# Patient Record
Sex: Male | Born: 1950
Health system: Southern US, Community
[De-identification: ages and names within clinical notes are randomized; demographics above are authoritative.]

## PROBLEM LIST (undated history)

## (undated) DIAGNOSIS — I10 Essential (primary) hypertension: Secondary | ICD-10-CM

## (undated) DIAGNOSIS — I712 Thoracic aortic aneurysm, without rupture, unspecified: Secondary | ICD-10-CM

## (undated) DIAGNOSIS — M4306 Spondylolysis, lumbar region: Secondary | ICD-10-CM

## (undated) DIAGNOSIS — C801 Malignant (primary) neoplasm, unspecified: Secondary | ICD-10-CM

## (undated) DIAGNOSIS — E785 Hyperlipidemia, unspecified: Secondary | ICD-10-CM

## (undated) DIAGNOSIS — T7840XA Allergy, unspecified, initial encounter: Secondary | ICD-10-CM

## (undated) DIAGNOSIS — Z87442 Personal history of urinary calculi: Secondary | ICD-10-CM

## (undated) DIAGNOSIS — M1990 Unspecified osteoarthritis, unspecified site: Secondary | ICD-10-CM

## (undated) DIAGNOSIS — R918 Other nonspecific abnormal finding of lung field: Secondary | ICD-10-CM

## (undated) HISTORY — PX: COLONOSCOPY: SHX174

## (undated) HISTORY — DX: Hyperlipidemia, unspecified: E78.5

## (undated) HISTORY — DX: Thoracic aortic aneurysm, without rupture: I71.2

## (undated) HISTORY — DX: Essential (primary) hypertension: I10

## (undated) HISTORY — DX: Spondylolysis, lumbar region: M43.06

## (undated) HISTORY — DX: Allergy, unspecified, initial encounter: T78.40XA

## (undated) HISTORY — DX: Thoracic aortic aneurysm, without rupture, unspecified: I71.20

## (undated) HISTORY — DX: Other nonspecific abnormal finding of lung field: R91.8

---

## 1973-01-05 HISTORY — PX: HERNIA REPAIR: SHX51

## 2000-01-06 HISTORY — PX: SHOULDER SURGERY: SHX246

## 2000-11-25 ENCOUNTER — Ambulatory Visit (HOSPITAL_BASED_OUTPATIENT_CLINIC_OR_DEPARTMENT_OTHER): Admission: RE | Admit: 2000-11-25 | Discharge: 2000-11-26 | Payer: Self-pay | Admitting: Orthopedic Surgery

## 2009-03-14 ENCOUNTER — Ambulatory Visit: Payer: Self-pay | Admitting: Cardiology

## 2009-03-14 DIAGNOSIS — E785 Hyperlipidemia, unspecified: Secondary | ICD-10-CM

## 2009-03-14 DIAGNOSIS — E783 Hyperchylomicronemia: Secondary | ICD-10-CM | POA: Insufficient documentation

## 2009-03-14 DIAGNOSIS — I10 Essential (primary) hypertension: Secondary | ICD-10-CM

## 2009-05-06 ENCOUNTER — Telehealth (INDEPENDENT_AMBULATORY_CARE_PROVIDER_SITE_OTHER): Payer: Self-pay | Admitting: *Deleted

## 2009-05-21 ENCOUNTER — Ambulatory Visit: Payer: Self-pay | Admitting: Cardiology

## 2009-05-24 ENCOUNTER — Telehealth: Payer: Self-pay | Admitting: Cardiology

## 2009-05-27 LAB — CONVERTED CEMR LAB
ALT: 47 units/L (ref 0–53)
Bilirubin, Direct: 0.2 mg/dL (ref 0.0–0.3)
Cholesterol: 119 mg/dL (ref 0–200)
HDL: 36 mg/dL — ABNORMAL LOW (ref >39.00)
LDL Cholesterol: 70 mg/dL (ref 0–99)
Total Bilirubin: 1 mg/dL (ref 0.3–1.2)
Total CHOL/HDL Ratio: 3
Triglycerides: 66 mg/dL (ref 0.0–149.0)
VLDL: 13.2 mg/dL (ref 0.0–40.0)

## 2009-10-15 ENCOUNTER — Telehealth (INDEPENDENT_AMBULATORY_CARE_PROVIDER_SITE_OTHER): Payer: Self-pay | Admitting: Radiology

## 2009-10-16 ENCOUNTER — Ambulatory Visit: Payer: Self-pay

## 2009-10-16 ENCOUNTER — Encounter: Payer: Self-pay | Admitting: Cardiovascular Disease

## 2009-10-16 ENCOUNTER — Ambulatory Visit: Payer: Self-pay | Admitting: Cardiovascular Disease

## 2009-10-16 ENCOUNTER — Encounter (HOSPITAL_COMMUNITY)
Admission: RE | Admit: 2009-10-16 | Discharge: 2009-11-01 | Payer: Self-pay | Source: Home / Self Care | Admitting: Cardiology

## 2010-02-04 NOTE — Progress Notes (Signed)
Summary: NUC PRE-PROCEDURE  Phone Note Outgoing Call   Call placed by: Domenic Polite, CNMT,  October 15, 2009 10:41 AM Call placed to: Patient Reason for Call: Confirm/change Appt Summary of Call: Left message with information on [employer] Information Sheet (see scanned document for details).   Initial call taken by: Domenic Polite, CNMT,  October 15, 2009 10:42 AM     Nuclear Med Background Indications for Stress Test: Evaluation for Ischemia     Symptoms: Chest Pain    Nuclear Pre-Procedure Cardiac Risk Factors: Hypertension, Lipids Height (in): 69

## 2010-02-04 NOTE — Progress Notes (Signed)
  Phone Note Outgoing Call   Call placed by: Scherrie Bateman, LPN,  May 06, 1608 3:52 PM Call placed to: Patient Summary of Call: PAST DUE FOR LIPID LIVER  LMTCB AND SCHEDULE.STARTED CRESTOR 20 MG 1 once daily Initial call taken by: Scherrie Bateman, LPN,  May 07, 9602 3:53 PM  Follow-up for Phone Call        n/a x1 Scherrie Bateman, LPN  May 08, 5407 8:30 AM lmtcb Scherrie Bateman, LPN  May 08, 8117 9:08 AM SPOKE WITH PT REMINDED TO COME IN FOR LIPID LIVER PER PT WILL BE IN NEXT WEEK. Follow-up by: Scherrie Bateman, LPN,  May 17, 2009 12:52 PM

## 2010-02-04 NOTE — Assessment & Plan Note (Signed)
Summary: NP6/ CVA RISK ASSESSMENT/ PER TW/ PT HAS [employer]/ GD   Visit Type:  new pt visit Primary Provider:  [facility]  CC:  no cardiac complaints today.  History of Present Illness: Shane Robinson comes in today self-referred for a cardiovascular risk assessment.  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  He has 3 risk factors for coronary disease. This includes age, hypertension for about 4 years which has been well controlled, and hyperlipidemia. He has been on a statin for 4 years.  He is a little bit overweight but very muscular. He works extremely hard manually on the farm. He denies any symptoms of angina or ischemia. His blood pressure is usually under good control.  His last laboratory data on 20 mg of Lipitor with a total cholesterol 183, triglycerides 192 which was fasting, HDL 42, LDL 103. His blood sugars normal creatinine is normal his potassium was 4.9.  He denies orthopnea PND or peripheral edema. He's had no palpitations or syncope or presyncope.  Preventive Screening-Counseling & Management  Alcohol-Tobacco     Smoking Status: never  Caffeine-Diet-Exercise     Does Patient Exercise: yes      Drug Use:  no.    Current Medications (verified): 1)  Lipitor 20 Mg Tabs (Atorvastatin Calcium) .Marland Kitchen.. 1 Tab At Bedtime 2)  Hydrochlorothiazide 12.5 Mg Caps (Hydrochlorothiazide) .Marland Kitchen.. 1 Cap Once Daily  Allergies (verified): No Known Drug Allergies  Past History:  Past Surgical History: Right Rotater Cuff  Family History: Father: Stroke in 92's  Social History: Full Time Married  Tobacco Use - No.  Alcohol Use - yes Regular Exercise - yes Drug Use - no Smoking Status:  never Does Patient Exercise:  yes Drug Use:  no  Review of Systems       negative other than history of present illness  Vital Signs:  Patient profile:   60 year old male Height:      69 inches Weight:      233 pounds BMI:     34.53 Pulse rate:   70 /  minute Pulse rhythm:   regular BP sitting:   126 / 80  (left arm) Cuff size:   large  Vitals Entered By: Danielle Rankin, CMA (March 14, 2009 1:53 PM)  Physical Exam  General:  muscular, slightly overweight Head:  normocephalic and atraumatic Eyes:  PERRLA/EOM intact; conjunctiva and lids normal. Neck:  Neck supple, no JVD. No masses, thyromegaly or abnormal cervical nodes. Chest Aneth Schlagel:  no deformities or breast masses noted Lungs:  Clear bilaterally to auscultation and percussion. Heart:  Non-displaced PMI, chest non-tender; regular rate and rhythm, S1, S2 without murmurs, rubs or gallops. Carotid upstroke normal, no bruit. Normal abdominal aortic size, no bruits. Femorals normal pulses, no bruits. Pedals normal pulses. No edema, no varicosities. Abdomen:  Bowel sounds positive; abdomen soft and non-tender without masses, organomegaly, or hernias noted. No hepatosplenomegaly. Msk:  decreased ROM.   Pulses:  pulses normal in all 4 extremities Extremities:  No clubbing or cyanosis. Neurologic:  Alert and oriented x 3. Skin:  Intact without lesions or rashes. Psych:  Normal affect.   Problems:  Medical Problems Added: 1)  Dx of Hypertension, Unspecified  (ICD-401.9) 2)  Dx of Hyperlipidemia-mixed  (ICD-272.4) 3)  Dx of Hyperlipidemia Type I / Iv  (ICD-272.3)  EKG  Procedure date:  03/14/2009  Findings:      normal sinus rhythm, normal EKG  Impression & Recommendations:  Problem #  1:  HYPERLIPIDEMIA-MIXED (ICD-272.4) I have reviewed his blood work drawn in October of 2010. To prevent progression and increase the anti-inflammatory effect of his statin, I will change him to Crestor 20 mg a day. We will check fasting blood work in 6 weeks. I've encouraged him to drop carbs as much as possible tickly sweets to drop some weight and his triglycerides. Goal LDL will be 70 or less. It may take 40 of Crestor. I certainly don't think we can get that level with Lipitor.  I spent a  considerable amount of time explaining to him plaque progression, inflammatory nature of plaque in its role in acute coronary syndromes, and how to respond if he has symptoms of angina or an acute coronary syndrome. I've also asked him to take any aspirin 81 mg a day. The following medications were removed from the medication list:    Lipitor 20 Mg Tabs (Atorvastatin calcium) .Marland Kitchen... 1 tab at bedtime His updated medication list for this problem includes:    Crestor 20 Mg Tabs (Rosuvastatin calcium) .Marland Kitchen... 1 once daily  Problem # 2:  HYPERTENSION, UNSPECIFIED (ICD-401.9) his That blood pressures under good control and his HDL above 40. I will make no changes in his program. His updated medication list for this problem includes:    Hydrochlorothiazide 12.5 Mg Caps (Hydrochlorothiazide) .Marland Kitchen... 1 cap once daily  Other Orders: EKG w/ Interpretation (93000)  Patient Instructions: 1)  Your physician recommends that you schedule a follow-up appointment in: AS NEEDED 2)  Your physician recommends that you return for lab work ZO:XWRUE LIVER  IN 6 WEEKS WEEK OF APRIL 21 3)  Your physician has recommended you make the following change in your medication: STOP LIPITOR START  CRESTOR 20 MG  Prescriptions: CRESTOR 20 MG TABS (ROSUVASTATIN CALCIUM) 1 once daily  #30 x 11   Entered by:   Scherrie Bateman, LPN   Authorized by:   Gaylord Shih, MD, [facility]   Signed by:   Scherrie Bateman, LPN on 45/40/9811   Method used:   Electronically to        [facility]  [address] (retail)       [address]       [place], [place]  [address]       Ph: [phone]       Fax: [phone]   RxID:   [id number]

## 2010-02-04 NOTE — Progress Notes (Signed)
Summary: test results   Phone Note Call from Patient Call back at Home Phone [phone]   Caller: Patient Reason for Call: Talk to Nurse, Lab or Test Results Initial call taken by: Lorne Skeens,  May 24, 2009 3:13 PM  Follow-up for Phone Call        Left message to call back Meredith Staggers, RN  May 24, 2009 3:26 PM   I attempted to call the pt at his home #. I was told the pt is at work and the best time to try to reach him at this # is b/t 10:30am- 11:00am. I will c/b. Sherri Rad, RN, BSN  May 27, 2009 8:26 AM   I spoke with the pt. Follow-up by: Sherri Rad, RN, BSN,  May 27, 2009 10:49 AM

## 2010-02-04 NOTE — Assessment & Plan Note (Signed)
Summary: Cardiology Nuclear Testing  Nuclear Med Background Indications for Stress Test: Evaluation for Ischemia     Symptoms: Palpitations    Nuclear Pre-Procedure Cardiac Risk Factors: Hypertension, Lipids Caffeine/Decaff Intake: None NPO After: 6:30 AM Lungs: clear IV 0.9% NS with Angio Cath: 22g     IV Site: R Hand IV Started by: Bonnita Levan, RN Chest Size (in) 46     Height (in): 69 Weight (lb): 234 BMI: 34.68  Nuclear Med Study 1 or 2 day study:  1 day     Stress Test Type:  Stress Reading MD:  Charlton Haws, MD     Referring MD:  T.Wall Resting Radionuclide:  Technetium 37m Tetrofosmin     Resting Radionuclide Dose:  10.8 mCi  Stress Radionuclide:  Technetium 41m Tetrofosmin     Stress Radionuclide Dose:  33.0 mCi   Stress Protocol Exercise Time (min):  6:00 min     Max HR:  142 bpm     Predicted Max HR:  162 bpm  Max Systolic BP: 195 mm Hg     Percent Max HR:  87.65 %     METS: 7.0 Rate Pressure Product:  [address]    Stress Test Technologist:  Milana Na, EMT-P     Nuclear Technologist:  Doyne Keel, CNMT  Rest Procedure  Myocardial perfusion imaging was performed at rest 45 minutes following the intravenous administration of Technetium 77m Tetrofosmin.  Stress Procedure  The patient exercised for 6:00. The patient stopped due to fatigue and denied any chest pain.  There were no significant ST-T wave changes and rare pvcs/V-Cuplets.  Technetium 20m Tetrofosmin was injected at peak exercise and myocardial perfusion imaging was performed after a brief delay.  QPS Raw Data Images:  Normal; no motion artifact; normal heart/lung ratio. Stress Images:  Normal homogeneous uptake in all areas of the myocardium. Rest Images:  Normal homogeneous uptake in all areas of the myocardium. Subtraction (SDS):  Normal Transient Ischemic Dilatation:  1.06  (Normal <1.22)  Lung/Heart Ratio:  0.39  (Normal <0.45)  Quantitative Gated Spect Images QGS EDV:  116 ml QGS ESV:   51 ml QGS EF:  56 % QGS cine images:  normal  Findings Low risk nuclear study      Overall Impression  Exercise Capacity: Fair exercise capacity. BP Response: Normal blood pressure response. Clinical Symptoms: No chest pain ECG Impression: No significant ST segment change suggestive of ischemia. Overall Impression: Thinning of the apex worse on rest images not thought to be significant.  No ischemia    Appended Document: Cardiology Nuclear Testing I called pt with results. No change in treatment.

## 2010-05-02 ENCOUNTER — Encounter: Payer: Self-pay | Admitting: Cardiology

## 2010-05-23 NOTE — Op Note (Signed)
[place]  Patient:    Shane Robinson, Shane Robinson Visit Number: [id number] MRN: [id number]          Service Type: DSU Location: [facility] Attending Physician:  Colbert Ewing Dictated by:   Loreta Ave, M.D. Proc. Date: 11/25/00 Admit Date:  11/25/2000                             Operative Report  PREOPERATIVE DIAGNOSES: 1. Right shoulder chronic retracted massive rotator cuff tear. 2. Chronic impingement. 3. Large os acromiale. 4. Degenerative joint disease acromioclavicular joint. 5. Dislocated biceps tendon, long head.  POSTOPERATIVE DIAGNOSES: 1. Right shoulder chronic retracted massive rotator cuff tear. 2. Chronic impingement. 3. Large os acromiale. 4. Degenerative joint disease acromioclavicular joint. 5. Dislocated biceps tendon, long head.  OPERATIVE PROCEDURES: 1. Right shoulder examination under anesthesia. 2. Arthroscopy. 3. Debridement of labral tears. 4. Assessment rotator cuff. 5. Arthroscopic acromioplasty. 6. Excision distal clavicle. 7. Open repair rotator cuff tear with fiber wire suture and Concept Repair    System. 8. Relocation tenodesis long head biceps tendon. 9. Open excision os acromiale and primary repair of deltoid to anterior    acromion - all right shoulder.  SURGEON:  Loreta Ave, M.D.  ASSISTANT:  Arlys John D. Petrarca, P.A.-C.  ANESTHESIA:  General.  BLOOD LOSS:  Minimal.  SPECIMENS:  None.  CULTURES:  None.  COMPLICATIONS:  None.  DRESSING:  Soft compressive with shoulder immobilizer.  DESCRIPTION OF PROCEDURE:  The patient was brought to the operating room and placed on the operating table in supine position.  After adequate anesthesia had been obtained, the right shoulder was examined.  Fairly good passive full motion without instability.  Placed in a beach chair position on a shoulder positioner, prepped and draped in usual sterile fashion.  Three standard arthroscopic portals -  anterior, posterior and lateral.  Shoulder entered with a blunt obturator, distended, and inspected.  Marked attritional tearing labrum, all debrided.  Biceps tendon was intact, but subluxed off the front of the humeral head.  Complete avulsion rotator cuff, including top of the subscapularis, entire supraspinatus, and top of the infraspinatus retracted to the level of the glenoid.  Despite the massive tears, still relatively mobile and felt to be repairable.  Type two acromion with a large nonunited os acromiale involving the anterior third of the acromion and about half of the [facility] joint.  Grade four changes distal clavicle.  Acromioplasty to a Type one acromion both on the os and on the acromion itself.  Lateral cm of clavicle was resected for grade four changes.  Cuff debrided and assessed.  Instruments and fluid was removed.  Deltoid splitting incision through the lateral portal.  The cuff assessed.  Mobilized throughout.  Well captured with five #2 fiber wire sutures.  Biceps tendon, long head, relocated to normal position.  A series of drill holes were made in the humeral attachment in a bony trough off the articular cartilage surface with the Concept Repair System.  Sutures were then weaved through this.  The anterior suture through the subscapularis was brought in front of the biceps tendon and then numerous sutures behind the biceps tendon.  These were placed at tenodesis biceps tendon down into its normal anatomic position in the groove and repaired end of the groove.  Once all the sutures were passed through bony tunnels, they were firmly pulled over and then tied over bony bridges on  the lateral aspect of the humerus.  This gave a nice, firm, watertight closure of the cuff with restoration of the long head of the biceps tendon in its normal position.  Despite how retracted and large this was, once I mobilized it and repaired it, I could bring the arm through full motion without  undue tension.  The incision was then brought more proximal.  Since I could get a watertight closure of the cuff, I elected to excise a very mobile symptomatic os acromiale.  This was shelled off from surrounding fascia and deltoid.  The wound was irrigated.  Deltoid was then reapproximated and sewn to the front of the remaining acromion, which was more than two-thirds the original acromion.  Nice firm Psychologist, forensic and closure. Adequacy of decompression was confirmed digitally before this was completed. Wound had been thoroughly irrigated before repair of the deltoid after os excision.  Wound irrigated.  Incision closed with subcutaneous and subcuticular Vicryl.  Portals closed with nylon.  Margins of  the wound injected with Marcaine.  Sterile compressive dressing and shoulder immobilizer applied.  Anesthesia reversed, brought to recovery room.  Tolerated surgery well, no complications. Dictated by:   Loreta Ave, M.D. Attending Physician:  Colbert Ewing DD:  11/25/00 TD:  11/26/00 Job: [id number]

## 2010-08-07 ENCOUNTER — Encounter: Payer: Self-pay | Admitting: Family Medicine

## 2011-10-08 ENCOUNTER — Ambulatory Visit (HOSPITAL_COMMUNITY)
Admission: RE | Admit: 2011-10-08 | Discharge: 2011-10-08 | Disposition: A | Discharge status: Home / Self Care | Payer: 59 | Source: Ambulatory Visit | Attending: Orthopedic Surgery | Admitting: Orthopedic Surgery

## 2011-10-08 ENCOUNTER — Other Ambulatory Visit (HOSPITAL_COMMUNITY): Payer: Self-pay | Admitting: Orthopedic Surgery

## 2011-10-08 DIAGNOSIS — IMO0002 Reserved for concepts with insufficient information to code with codable children: Secondary | ICD-10-CM

## 2011-10-08 DIAGNOSIS — Z1389 Encounter for screening for other disorder: Secondary | ICD-10-CM | POA: Insufficient documentation

## 2012-07-07 ENCOUNTER — Telehealth: Payer: Self-pay | Admitting: *Deleted

## 2012-07-07 MED ORDER — TOBRAMYCIN 0.3 % OP SOLN: 2.0000 [drp] | Freq: Four times a day (QID) | OPHTHALMIC | Status: DC

## 2012-07-07 NOTE — Addendum Note (Signed)
Addended by: Lisabeth Devoid F on: 07/07/2012 10:25 AM   Modules accepted: Orders

## 2012-07-07 NOTE — Telephone Encounter (Signed)
Dr. Daleen Squibb calls today to prescribed Tobramycin eye gtts for pt right eye tear duct irritation. Pt is aware medication prescription sent in. Mylo Red RN

## 2012-07-20 ENCOUNTER — Other Ambulatory Visit: Payer: Self-pay | Admitting: Family Medicine

## 2012-07-20 NOTE — Telephone Encounter (Signed)
needs ov been yr,left message to return call/ss

## 2012-07-22 ENCOUNTER — Encounter: Payer: Self-pay | Admitting: Family Medicine

## 2012-07-22 ENCOUNTER — Telehealth: Payer: Self-pay | Admitting: Family Medicine

## 2012-07-22 MED ORDER — BENAZEPRIL-HYDROCHLOROTHIAZIDE 10-12.5 MG PO TABS: 1.0000 | ORAL_TABLET | Freq: Every day | ORAL | Status: DC

## 2012-07-22 MED ORDER — ROSUVASTATIN CALCIUM 20 MG PO TABS: 20.0000 mg | ORAL_TABLET | Freq: Every day | ORAL | Status: DC

## 2012-07-22 NOTE — Telephone Encounter (Signed)
Letter to pt to make appt.

## 2012-07-22 NOTE — Telephone Encounter (Signed)
Rx Refilled  

## 2012-09-19 ENCOUNTER — Ambulatory Visit: Payer: Self-pay | Admitting: Family Medicine

## 2012-09-29 ENCOUNTER — Encounter (HOSPITAL_COMMUNITY): Payer: Self-pay | Admitting: Neurology

## 2012-09-29 ENCOUNTER — Emergency Department (HOSPITAL_COMMUNITY)
Admission: EM | Admit: 2012-09-29 | Discharge: 2012-09-29 | Disposition: A | Discharge status: Home / Self Care | Payer: 59 | Attending: Emergency Medicine | Admitting: Emergency Medicine

## 2012-09-29 ENCOUNTER — Emergency Department (HOSPITAL_COMMUNITY): Payer: 59

## 2012-09-29 DIAGNOSIS — R110 Nausea: Secondary | ICD-10-CM | POA: Insufficient documentation

## 2012-09-29 DIAGNOSIS — I10 Essential (primary) hypertension: Secondary | ICD-10-CM | POA: Insufficient documentation

## 2012-09-29 DIAGNOSIS — N201 Calculus of ureter: Secondary | ICD-10-CM | POA: Insufficient documentation

## 2012-09-29 DIAGNOSIS — Z9109 Other allergy status, other than to drugs and biological substances: Secondary | ICD-10-CM | POA: Insufficient documentation

## 2012-09-29 DIAGNOSIS — E785 Hyperlipidemia, unspecified: Secondary | ICD-10-CM | POA: Insufficient documentation

## 2012-09-29 DIAGNOSIS — Z79899 Other long term (current) drug therapy: Secondary | ICD-10-CM | POA: Insufficient documentation

## 2012-09-29 LAB — COMPREHENSIVE METABOLIC PANEL
ALT: 35 U/L (ref 0–53)
AST: 36 U/L (ref 0–37)
Albumin: 4.4 g/dL (ref 3.5–5.2)
Alkaline Phosphatase: 50 U/L (ref 39–117)
Creatinine, Ser: 1.25 mg/dL (ref 0.50–1.35)
Glucose, Bld: 102 mg/dL — ABNORMAL HIGH (ref 70–99)
Potassium: 4.4 mEq/L (ref 3.5–5.1)
Sodium: 140 mEq/L (ref 135–145)
Total Protein: 8.1 g/dL (ref 6.0–8.3)

## 2012-09-29 LAB — CBC WITH DIFFERENTIAL/PLATELET
Eosinophils Absolute: 0.1 10*3/uL (ref 0.0–0.7)
Eosinophils Relative: 1 % (ref 0–5)
HCT: 45.2 % (ref 39.0–52.0)
Hemoglobin: 15 g/dL (ref 13.0–17.0)
Lymphs Abs: 1.7 10*3/uL (ref 0.7–4.0)
MCH: 31.4 pg (ref 26.0–34.0)
Monocytes Absolute: 0.6 10*3/uL (ref 0.1–1.0)
Neutrophils Relative %: 80 % — ABNORMAL HIGH (ref 43–77)
Platelets: 180 10*3/uL (ref 150–400)
RBC: 4.77 MIL/uL (ref 4.22–5.81)
WBC: 12 10*3/uL — ABNORMAL HIGH (ref 4.0–10.5)

## 2012-09-29 LAB — URINALYSIS, ROUTINE W REFLEX MICROSCOPIC
Bilirubin Urine: NEGATIVE
Glucose, UA: NEGATIVE mg/dL
Nitrite: NEGATIVE
Specific Gravity, Urine: 1.012 (ref 1.005–1.030)
Urobilinogen, UA: 0.2 mg/dL (ref 0.0–1.0)

## 2012-09-29 LAB — URINE MICROSCOPIC-ADD ON

## 2012-09-29 MED ORDER — OXYCODONE-ACETAMINOPHEN 5-325 MG PO TABS: ORAL_TABLET | ORAL | Status: DC

## 2012-09-29 MED ORDER — KETOROLAC TROMETHAMINE 30 MG/ML IJ SOLN
30.0000 mg | Freq: Once | INTRAMUSCULAR | Status: AC
  Administered 2012-09-29: 30 mg via INTRAVENOUS
  Filled 2012-09-29: qty 1

## 2012-09-29 MED ORDER — TAMSULOSIN HCL 0.4 MG PO CAPS: ORAL_CAPSULE | ORAL | Status: DC

## 2012-09-29 MED ORDER — SODIUM CHLORIDE 0.9 % IV SOLN
Freq: Once | INTRAVENOUS | Status: AC
  Administered 2012-09-29: 11:00:00 via INTRAVENOUS

## 2012-09-29 MED ORDER — ONDANSETRON HCL 4 MG PO TABS: 4.0000 mg | ORAL_TABLET | Freq: Three times a day (TID) | ORAL | Status: DC | PRN

## 2012-09-29 MED ORDER — HYDROMORPHONE HCL PF 1 MG/ML IJ SOLN
1.0000 mg | Freq: Once | INTRAMUSCULAR | Status: AC
  Administered 2012-09-29: 1 mg via INTRAVENOUS
  Filled 2012-09-29: qty 1

## 2012-09-29 MED ORDER — ONDANSETRON HCL 4 MG/2ML IJ SOLN
4.0000 mg | Freq: Once | INTRAMUSCULAR | Status: AC
  Administered 2012-09-29: 4 mg via INTRAVENOUS
  Filled 2012-09-29: qty 2

## 2012-09-29 MED ORDER — MORPHINE SULFATE 4 MG/ML IJ SOLN
4.0000 mg | Freq: Once | INTRAMUSCULAR | Status: AC
  Administered 2012-09-29: 4 mg via INTRAVENOUS
  Filled 2012-09-29: qty 1

## 2012-09-29 NOTE — ED Notes (Addendum)
Pt reporting left lower flank pain since 0730 this morning. Nausea present, denies hx of kidney stones or hematuria. Pt is crying at time of triage.

## 2012-09-29 NOTE — ED Notes (Signed)
Placed on 2L/Shane Robinson patient dropped oxygen level to 88% after given Dilaudid.  Patient now up to 98%

## 2012-09-29 NOTE — ED Provider Notes (Signed)
CSN: [id number]     Arrival date & time 09/29/12  0919 History   First MD Initiated Contact with Patient 09/29/12 0945     Chief Complaint  Patient presents with  . 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 He had nausea without vomiting. He states he did not have hematuria today or before today. He states nothing he does makes the pain hurt more other than deep breathing. He does not feel short of breath, and he denies chest pain. He states the pain is severe and has been constant although it does wax and wane. He states he's never had this pain before. Patient does has a history of hypertension.  Patient denies family history of renal stones or aneurysms  PCP Dr Tanya Nones at [facility]  Past Medical History  Diagnosis Date  . Allergy   . Hypertension   . Elevated lipids    Past Surgical History  Procedure Laterality Date  . Shoulder surgery    . Hernia repair     No family history on file. History  Substance Use Topics  . Smoking status: Never Smoker   . Smokeless tobacco: Not on file  . Alcohol Use: Yes  lives at home Lives with spouse Runs a dairy farm  Review of Systems  All other systems reviewed and are negative.    Allergies  Review of patient's allergies indicates no known allergies.  Home Medications   Current Outpatient Rx  Name  Route  Sig  Dispense  Refill  . benazepril-hydrochlorthiazide (LOTENSIN HCT) 10-12.5 MG per tablet   Oral   Take 1 tablet by mouth daily.   90 tablet   0   . Multiple Vitamins-Minerals (CENTRUM SILVER PO)   Oral   Take 1 tablet by mouth daily.         . rosuvastatin (CRESTOR) 20 MG tablet   Oral   Take 1 tablet (20 mg total) by mouth daily.   90 tablet   0    BP 160/91  Pulse 67  Temp(Src) 98.2 F (36.8 C)   Resp 20  Ht 5\' 9"  (1.753 m)  Wt 228 lb (103.42 kg)  BMI 33.65 kg/m2  SpO2 99%  Vital signs normal    Physical Exam  Nursing note and vitals reviewed. Constitutional: He is oriented to person, place, and time. He appears well-developed and well-nourished.  Non-toxic appearance. He does not appear ill. No distress.  HENT:  Head: Normocephalic and atraumatic.  Right Ear: External ear normal.  Left Ear: External ear normal.  Nose: Nose normal. No mucosal edema or rhinorrhea.  Mouth/Throat: Oropharynx is clear and moist and mucous membranes are normal. No dental abscesses or edematous.  Eyes: Conjunctivae and EOM are normal. Pupils are equal, round, and reactive to light.  Neck: Normal range of motion and full passive range of motion without pain. Neck supple.  Cardiovascular: Normal rate, regular rhythm and normal heart sounds.  Exam reveals no gallop and no friction rub.   No murmur heard. Pulmonary/Chest: Effort normal and breath sounds normal. No respiratory distress. He has no wheezes. He has no rhonchi. He has no rales. He exhibits no tenderness and no crepitus.  Abdominal: Soft. Normal appearance and bowel sounds are normal. He exhibits no distension. There is no tenderness. There is no rebound and no guarding.  Musculoskeletal:  Normal range of motion. He exhibits no edema and no tenderness.       Back:  Moves all extremities well.   Area of left flank pain noted  Neurological: He is alert and oriented to person, place, and time. He has normal strength. No cranial nerve deficit.  Skin: Skin is warm, dry and intact. No rash noted. No erythema. No pallor.  Psychiatric: He has a normal mood and affect. His speech is normal and behavior is normal. His mood appears not anxious.    ED Course  Procedures (including critical care time)  Medications  0.9 %  sodium chloride infusion ( Intravenous Stopped 09/29/12 1442)  HYDROmorphone (DILAUDID) injection 1 mg (1 mg Intravenous Given  09/29/12 1047)  ondansetron (ZOFRAN) injection 4 mg (4 mg Intravenous Given 09/29/12 1047)  morphine 4 MG/ML injection 4 mg (4 mg Intravenous Given 09/29/12 1312)  ketorolac (TORADOL) 30 MG/ML injection 30 mg (30 mg Intravenous Given 09/29/12 1514)   Discussed patient's test results. States the initial pain med got rid of his pain but it was starting to return at 12:30. More pain meds given (pt had to be put on oxygen after the dilaudid for hypoxia).   Pt ready to be discharged with no pain at discharge. Discussed reasons to return to the ED.    Labs Review  Results for orders placed during the hospital encounter of 09/29/12  URINALYSIS, ROUTINE W REFLEX MICROSCOPIC      Result Value Range   Color, Urine YELLOW  YELLOW   APPearance HAZY (*) CLEAR   Specific Gravity, Urine 1.012  1.005 - 1.030   pH 5.5  5.0 - 8.0   Glucose, UA NEGATIVE  NEGATIVE mg/dL   Hgb urine dipstick LARGE (*) NEGATIVE   Bilirubin Urine NEGATIVE  NEGATIVE   Ketones, ur NEGATIVE  NEGATIVE mg/dL   Protein, ur NEGATIVE  NEGATIVE mg/dL   Urobilinogen, UA 0.2  0.0 - 1.0 mg/dL   Nitrite NEGATIVE  NEGATIVE   Leukocytes, UA NEGATIVE  NEGATIVE  CBC WITH DIFFERENTIAL      Result Value Range   WBC 12.0 (*) 4.0 - 10.5 K/uL   RBC 4.77  4.22 - 5.81 MIL/uL   Hemoglobin 15.0  13.0 - 17.0 g/dL   HCT 16.1  09.6 - 04.5 %   MCV 94.8  78.0 - 100.0 fL   MCH 31.4  26.0 - 34.0 pg   MCHC 33.2  30.0 - 36.0 g/dL   RDW 40.9  81.1 - 91.4 %   Platelets 180  150 - 400 K/uL   Neutrophils Relative % 80 (*) 43 - 77 %   Neutro Abs 9.6 (*) 1.7 - 7.7 K/uL   Lymphocytes Relative 14  12 - 46 %   Lymphs Abs 1.7  0.7 - 4.0 K/uL   Monocytes Relative 5  3 - 12 %   Monocytes Absolute 0.6  0.1 - 1.0 K/uL   Eosinophils Relative 1  0 - 5 %   Eosinophils Absolute 0.1  0.0 - 0.7 K/uL   Basophils Relative 0  0 - 1 %   Basophils Absolute 0.0  0.0 - 0.1 K/uL  COMPREHENSIVE METABOLIC PANEL      Result Value Range   Sodium 140  135 - 145 mEq/L    Potassium 4.4  3.5 - 5.1 mEq/L   Chloride 103  96 - 112 mEq/L   CO2 27  19 - 32 mEq/L   Glucose, Bld 102 (*) 70 - 99 mg/dL  BUN 22  6 - 23 mg/dL   Creatinine, Ser 1.61  0.50 - 1.35 mg/dL   Calcium 9.3  8.4 - 09.6 mg/dL   Total Protein 8.1  6.0 - 8.3 g/dL   Albumin 4.4  3.5 - 5.2 g/dL   AST 36  0 - 37 U/L   ALT 35  0 - 53 U/L   Alkaline Phosphatase 50  39 - 117 U/L   Total Bilirubin 0.5  0.3 - 1.2 mg/dL   GFR calc non Af Amer 61 (*) >90 mL/min   GFR calc Af Amer 70 (*) >90 mL/min  URINE MICROSCOPIC-ADD ON      Result Value Range   Squamous Epithelial / LPF RARE  RARE   WBC, UA 0-2  <3 WBC/hpf   RBC / HPF 21-50  <3 RBC/hpf   Bacteria, UA RARE  RARE   Laboratory interpretation all normal except leukocytosis     Imaging Review Ct Abdomen Pelvis Wo Contrast  09/29/2012   CLINICAL DATA:  Left flank pain  EXAM: CT ABDOMEN AND PELVIS WITHOUT CONTRAST  TECHNIQUE: Multidetector CT imaging of the abdomen and pelvis was performed following the standard protocol without intravenous contrast.  COMPARISON:  None.  FINDINGS: Sagittal images of the spine shows degenerative changes thoracolumbar spine. Probable hemangioma L2 vertebral body. Significant disc space flattening with vacuum disc phenomenon at L5-S1 level. Bilateral pars defect at L5 level. About 9 mm anterolisthesis L5 on S1 vertebral body.  Axial image 6 there is 9 mm nodule in right lower lobe laterally. A 2nd nodule in right lower lobe laterally measures 5 mm. Nodule in left lower lobe laterally measures 7 mm. Follow-up CT scan of the chest in 3 months is recommended to assure stability.  Mild atherosclerotic calcifications of abdominal aorta and iliac arteries. No aortic aneurysm.  No definite calcified gallstones are noted within gallbladder.  Unenhanced liver shows no biliary ductal dilatation. The pancreas, spleen and adrenals are unremarkable. There is mild left hydronephrosis and left hydroureter. Mild left perinephric and  periureteral stranding. No proximal ureteral calculi are noted bilaterally.  In axial image 80 there is poorly visualized 2 mm calcified calculus in left UVJ region. This is best visualized in coronal image 96. Tiny punctate calcification noted within prostate gland centrally. No pericecal inflammation. Normal appendix is clearly visualize in axial image 67. There is a right inguinal scrotal carinal hernia containing fat without evidence of acute complication. Measures 3 cm. The  IMPRESSION: 1. There is mild left hydronephrosis and minimal left hydroureter. Mild left perinephric and periureteral stranding. 2. There is poorly visualized 2 mm calcified calculus in left UVJ. Best seen in coronal image 96. 3. No pericecal inflammation. Normal appendix. 4. Degenerative changes lumbar spine. About 9 mm anterolisthesis L5 on S1 vertebral body. Bilateral pars defect at L5 level. 5. Axial image 6 there is 9 mm nodule in right lower lobe laterally. A 2nd nodule in right lower lobe laterally measures 5 mm. Nodule in left lower lobe laterally measures 7 mm. Follow-up CT scan of the chest in 3 months is recommended to assure stability.   Electronically Signed   By: Natasha Mead   On: 09/29/2012 12:45    MDM  patient presents with no family history of aneurysms or renal stones with acute left flank pain. Patient has a history of hypertension. His initial presentation was worrisome for possible AAA or renal stone. CT scan was consistent with a ureteral stone that is small and he should  be able to pass it without intervention.    1. Ureteral stone     New Prescriptions   ONDANSETRON (ZOFRAN) 4 MG TABLET    Take 1 tablet (4 mg total) by mouth every 8 (eight) hours as needed for nausea.   OXYCODONE-ACETAMINOPHEN (PERCOCET/ROXICET) 5-325 MG PER TABLET    Take 1 or 2 po Q 6hrs for pain   TAMSULOSIN (FLOMAX) 0.4 MG CAPS CAPSULE    Take 1 po QD until you pass the stone.     Plan discharge   Devoria Albe, MD, Franz Dell, MD 09/29/12 [phone]

## 2012-09-29 NOTE — ED Notes (Signed)
Family at bedside. 

## 2012-12-09 ENCOUNTER — Other Ambulatory Visit: Payer: 59

## 2012-12-09 DIAGNOSIS — E785 Hyperlipidemia, unspecified: Secondary | ICD-10-CM

## 2012-12-09 DIAGNOSIS — Z79899 Other long term (current) drug therapy: Secondary | ICD-10-CM

## 2012-12-09 DIAGNOSIS — I10 Essential (primary) hypertension: Secondary | ICD-10-CM

## 2012-12-09 DIAGNOSIS — Z125 Encounter for screening for malignant neoplasm of prostate: Secondary | ICD-10-CM

## 2012-12-10 LAB — COMPREHENSIVE METABOLIC PANEL
AST: 41 U/L — ABNORMAL HIGH (ref 0–37)
Albumin: 4.1 g/dL (ref 3.5–5.2)
BUN: 21 mg/dL (ref 6–23)
Calcium: 9 mg/dL (ref 8.4–10.5)
Chloride: 104 mEq/L (ref 96–112)
Glucose, Bld: 76 mg/dL (ref 70–99)
Potassium: 4.6 mEq/L (ref 3.5–5.3)
Sodium: 139 mEq/L (ref 135–145)
Total Protein: 7.1 g/dL (ref 6.0–8.3)

## 2012-12-10 LAB — LIPID PANEL
HDL: 44 mg/dL (ref >39)
Triglycerides: 110 mg/dL (ref <150)

## 2012-12-12 LAB — CBC WITH DIFFERENTIAL/PLATELET
HCT: 45.3 % (ref 39.0–52.0)
Hemoglobin: 14.7 g/dL (ref 13.0–17.0)
Lymphocytes Relative: 36 % (ref 12–46)
Monocytes Absolute: 0.3 10*3/uL (ref 0.1–1.0)
Monocytes Relative: 5 % (ref 3–12)
Neutro Abs: 3.5 10*3/uL (ref 1.7–7.7)
Platelets: 216 10*3/uL (ref 150–400)
RBC: 4.66 MIL/uL (ref 4.22–5.81)
WBC: 6.2 10*3/uL (ref 4.0–10.5)

## 2012-12-12 LAB — PSA: PSA: 0.39 ng/mL (ref <=4.00)

## 2012-12-20 ENCOUNTER — Telehealth: Payer: Self-pay | Admitting: Family Medicine

## 2012-12-20 NOTE — Telephone Encounter (Signed)
Pt came in few weeks ago for blood work to get his crestor refilled and he was told then he had to have a cpe done and he is going to on the 23rd and he wants to know if he has to wait to then to get his crestor refilled Call back number is [phone]

## 2012-12-21 MED ORDER — ROSUVASTATIN CALCIUM 20 MG PO TABS: 20.0000 mg | ORAL_TABLET | Freq: Every day | ORAL | Status: DC

## 2012-12-21 NOTE — Telephone Encounter (Signed)
LMOVM that pt did not need to wait for appt before filling his Crestor. Sent rx to pharmacy.

## 2012-12-27 ENCOUNTER — Encounter: Payer: Self-pay | Admitting: Family Medicine

## 2012-12-27 ENCOUNTER — Ambulatory Visit (INDEPENDENT_AMBULATORY_CARE_PROVIDER_SITE_OTHER): Payer: 59 | Admitting: Family Medicine

## 2012-12-27 VITALS — BP 142/90 | HR 68 | Temp 97.5°F | Resp 16 | Ht 69.0 in | Wt 231.0 lb

## 2012-12-27 DIAGNOSIS — Z0000 Encounter for general adult medical examination without abnormal findings: Secondary | ICD-10-CM

## 2012-12-27 DIAGNOSIS — Z23 Encounter for immunization: Secondary | ICD-10-CM

## 2012-12-27 MED ORDER — ROSUVASTATIN CALCIUM 20 MG PO TABS: 20.0000 mg | ORAL_TABLET | Freq: Every day | ORAL | Status: DC

## 2012-12-27 MED ORDER — AZITHROMYCIN 250 MG PO TABS: ORAL_TABLET | ORAL | Status: DC

## 2012-12-27 MED ORDER — BENAZEPRIL HCL 20 MG PO TABS: 20.0000 mg | ORAL_TABLET | Freq: Every day | ORAL | Status: DC

## 2012-12-27 NOTE — Progress Notes (Signed)
Subjective:    Patient ID: Shane Robinson, male    DOB: Jan 05, 1951, 62 y.o.   MRN: [id number]  HPI Patient is a very pleasant 62 year old white male who comes in today for complete physical exam. Temperature he has been out of his cholesterol medication and his blood pressure medication the last 2 weeks. He denies any medical problems at present. His blood pressures only slightly elevated today at 142/90. His last tetanus shot was in 2007. He is due for a flu shot. He is due for a prostate exam. His last colonoscopy was in 2009 and is up to date. He is also due for Zostavax. Past Medical History  Diagnosis Date  . Allergy   . Hypertension   . Elevated lipids   . Hyperlipidemia    Past Surgical History  Procedure Laterality Date  . Shoulder surgery    . Hernia repair     Current Outpatient Prescriptions on File Prior to Visit  Medication Sig Dispense Refill  . Multiple Vitamins-Minerals (CENTRUM SILVER PO) Take 1 tablet by mouth daily.       No current facility-administered medications on file prior to visit.   No Known Allergies History   Social History  . Marital Status: Single    Spouse Name: N/A    Number of Children: N/A  . Years of Education: N/A   Occupational History  . Not on file.   Social History Main Topics  . Smoking status: Never Smoker   . Smokeless tobacco: Never Used  . Alcohol Use: Yes  . Drug Use: No  . Sexual Activity: Not on file     Comment: married, dairy farmer   Other Topics Concern  . Not on file   Social History Narrative  . No narrative on file   Family History  Problem Relation Age of Onset  . Cancer Mother     stomach  . Heart disease Father   . Stroke Father       Review of Systems  All other systems reviewed and are negative.       Objective:   Physical Exam  Vitals reviewed. Constitutional: He is oriented to person, place, and time. He appears well-developed and well-nourished. No distress.  HENT:  Head:  Normocephalic and atraumatic.  Right Ear: External ear normal.  Left Ear: External ear normal.  Nose: Nose normal.  Mouth/Throat: Oropharynx is clear and moist. No oropharyngeal exudate.  Eyes: Conjunctivae and EOM are normal. Pupils are equal, round, and reactive to light. Right eye exhibits no discharge. Left eye exhibits no discharge. No scleral icterus.  Neck: Normal range of motion. Neck supple. No JVD present. No tracheal deviation present. No thyromegaly present.  Cardiovascular: Normal rate, regular rhythm, normal heart sounds and intact distal pulses.  Exam reveals no gallop and no friction rub.   No murmur heard. Pulmonary/Chest: Effort normal and breath sounds normal. No stridor. No respiratory distress. He has no wheezes. He has no rales. He exhibits no tenderness.  Abdominal: Soft. Bowel sounds are normal. He exhibits no distension and no mass. There is no tenderness. There is no rebound and no guarding.  Genitourinary: Rectum normal, prostate normal and penis normal. No penile tenderness.  Musculoskeletal: Normal range of motion. He exhibits no edema and no tenderness.  Lymphadenopathy:    He has no cervical adenopathy.  Neurological: He is alert and oriented to person, place, and time. He has normal reflexes. He displays normal reflexes. No cranial nerve deficit. He exhibits  normal muscle tone. Coordination normal.  Skin: Skin is warm. No rash noted. He is not diaphoretic. No erythema. No pallor.  Psychiatric: He has a normal mood and affect. His behavior is normal. Judgment and thought content normal.    Lab on 12/09/2012  Component Date Value Range Status  . WBC 12/09/2012 6.2  4.0 - 10.5 K/uL Final  . RBC 12/09/2012 4.66  4.22 - 5.81 MIL/uL Final  . Hemoglobin 12/09/2012 14.7  13.0 - 17.0 g/dL Final  . HCT 16/10/9602 45.3  39.0 - 52.0 % Final  . MCV 12/09/2012 97.2  78.0 - 100.0 fL Final  . MCH 12/09/2012 31.5  26.0 - 34.0 pg Final  . MCHC 12/09/2012 32.5  30.0 - 36.0  g/dL Final  . RDW 54/09/8117 13.6  11.5 - 15.5 % Final  . Platelets 12/09/2012 216  150 - 400 K/uL Final  . Neutrophils Relative % 12/09/2012 56  43 - 77 % Final  . Neutro Abs 12/09/2012 3.5  1.7 - 7.7 K/uL Final  . Lymphocytes Relative 12/09/2012 36  12 - 46 % Final  . Lymphs Abs 12/09/2012 2.2  0.7 - 4.0 K/uL Final  . Monocytes Relative 12/09/2012 5  3 - 12 % Final  . Monocytes Absolute 12/09/2012 0.3  0.1 - 1.0 K/uL Final  . Eosinophils Relative 12/09/2012 2  0 - 5 % Final  . Eosinophils Absolute 12/09/2012 0.2  0.0 - 0.7 K/uL Final  . Basophils Relative 12/09/2012 1  0 - 1 % Final  . Basophils Absolute 12/09/2012 0.0  0.0 - 0.1 K/uL Final  . Smear Review 12/09/2012 Criteria for review not met   Final  . Sodium 12/09/2012 139  135 - 145 mEq/L Final  . Potassium 12/09/2012 4.6  3.5 - 5.3 mEq/L Final  . Chloride 12/09/2012 104  96 - 112 mEq/L Final  . CO2 12/09/2012 26  19 - 32 mEq/L Final  . Glucose, Bld 12/09/2012 76  70 - 99 mg/dL Final  . BUN 14/78/2956 21  6 - 23 mg/dL Final  . Creat 21/30/8657 0.96  0.50 - 1.35 mg/dL Final  . Total Bilirubin 12/09/2012 0.6  0.3 - 1.2 mg/dL Final  . Alkaline Phosphatase 12/09/2012 42  39 - 117 U/L Final  . AST 12/09/2012 41* 0 - 37 U/L Final  . ALT 12/09/2012 40  0 - 53 U/L Final  . Total Protein 12/09/2012 7.1  6.0 - 8.3 g/dL Final  . Albumin 84/69/6295 4.1  3.5 - 5.2 g/dL Final  . Calcium 28/41/3244 9.0  8.4 - 10.5 mg/dL Final  . Cholesterol 01/07/7251 125  0 - 200 mg/dL Final   Comment: ATP III Classification:                                < 200        mg/dL        Desirable                               200 - 239     mg/dL        Borderline High                               >= 240        mg/dL  High                             . Triglycerides 12/09/2012 110  <150 mg/dL Final  . HDL 16/10/9602 44  >39 mg/dL Final  . Total CHOL/HDL Ratio 12/09/2012 2.8   Final  . VLDL 12/09/2012 22  0 - 40 mg/dL Final  . LDL Cholesterol  12/09/2012 59  0 - 99 mg/dL Final   Comment:                            Total Cholesterol/HDL Ratio:CHD Risk                                                 Coronary Heart Disease Risk Table                                                                 Men       Women                                   1/2 Average Risk              3.4        3.3                                       Average Risk              5.0        4.4                                    2X Average Risk              9.6        7.1                                    3X Average Risk             23.4       11.0                          Use the calculated Patient Ratio above and the CHD Risk table                           to determine the patient's CHD Risk.                          ATP III Classification (LDL):                                <  100        mg/dL         Optimal                               100 - 129     mg/dL         Near or Above Optimal                               130 - 159     mg/dL         Borderline High                               160 - 189     mg/dL         High                                > 190        mg/dL         Very High                             . PSA 12/09/2012 0.39  <=4.00 ng/mL Final   Comment: Test Methodology: ECLIA PSA (Electrochemiluminescence Immunoassay)                                                     For PSA values from 2.5-4.0, particularly in younger men <60 years                          old, the AUA and NCCN suggest testing for % Free PSA (3515) and                          evaluation of the rate of increase in PSA (PSA velocity).         Assessment & Plan:  1. Routine general medical examination at a health care facility His blood pressures only slightly elevated. I recommended he start benazepril 20 mg by mouth daily. I recommended he continue Crestor 20 mg by mouth daily. The patient received his flu shot today. His prostate exam was normal.  His  immunizations are otherwise up to date. He will inquire about the cost of Zostavax. If he wants the shot he will call us back. He is not due for another colonoscopy until 2019. Recheck his blood pressure in one month.

## 2012-12-27 NOTE — Addendum Note (Signed)
Addended by: Legrand Rams B on: 12/27/2012 12:15 PM   Modules accepted: Orders

## 2013-02-27 ENCOUNTER — Ambulatory Visit (INDEPENDENT_AMBULATORY_CARE_PROVIDER_SITE_OTHER): Payer: 59 | Admitting: Family Medicine

## 2013-02-27 ENCOUNTER — Encounter: Payer: Self-pay | Admitting: Family Medicine

## 2013-02-27 VITALS — BP 150/80 | HR 84 | Temp 98.1°F | Resp 18 | Ht 69.0 in | Wt 235.0 lb

## 2013-02-27 DIAGNOSIS — J209 Acute bronchitis, unspecified: Secondary | ICD-10-CM

## 2013-02-27 MED ORDER — AZITHROMYCIN 250 MG PO TABS: ORAL_TABLET | ORAL | Status: DC

## 2013-02-27 MED ORDER — HYDROCODONE-HOMATROPINE 5-1.5 MG/5ML PO SYRP: 5.0000 mL | ORAL_SOLUTION | Freq: Three times a day (TID) | ORAL | Status: DC | PRN

## 2013-02-27 MED ORDER — METHYLPREDNISOLONE ACETATE 40 MG/ML IJ SUSP
60.0000 mg | Freq: Once | INTRAMUSCULAR | Status: AC
  Administered 2013-02-28: 60 mg via INTRAMUSCULAR

## 2013-02-27 NOTE — Progress Notes (Signed)
Subjective:    Patient ID: Shane Robinson, male    DOB: May 22, 1950, 63 y.o.   MRN: [id number]  HPI Patient presents with one week of cough this progressively worsening. He reports pleurisy and pain with coughing. He reports wheezing and shortness of breath. He denies fever. He is wheezing today on examination with scattered rhonchi appreciated on both sides.  He denies any hemoptysis. He denies any current nasal drainage. He denies any sinus pain. He does report sinus pressure. He denies any rhinorrhea. He denies any sore throat. He denies any otalgia. Past Medical History  Diagnosis Date  . Allergy   . Hypertension   . Elevated lipids   . Hyperlipidemia    Current Outpatient Prescriptions on File Prior to Visit  Medication Sig Dispense Refill  . benazepril (LOTENSIN) 20 MG tablet Take 1 tablet (20 mg total) by mouth daily.  90 tablet  3  . Multiple Vitamins-Minerals (CENTRUM SILVER PO) Take 1 tablet by mouth daily.      . rosuvastatin (CRESTOR) 20 MG tablet Take 1 tablet (20 mg total) by mouth daily.  90 tablet  0   No current facility-administered medications on file prior to visit.   No Known Allergies History   Social History  . Marital Status: Single    Spouse Name: N/A    Number of Children: N/A  . Years of Education: N/A   Occupational History  . Not on file.   Social History Main Topics  . Smoking status: Never Smoker   . Smokeless tobacco: Never Used  . Alcohol Use: Yes  . Drug Use: No  . Sexual Activity: Not on file     Comment: married, dairy farmer   Other Topics Concern  . Not on file   Social History Narrative  . No narrative on file      Review of Systems  All other systems reviewed and are negative.       Objective:   Physical Exam  Vitals reviewed. Constitutional: He appears well-developed and well-nourished. No distress.  HENT:  Right Ear: External ear normal.  Left Ear: External ear normal.  Nose: Nose normal.  Mouth/Throat:  Oropharynx is clear and moist. No oropharyngeal exudate.  Eyes: Conjunctivae are normal. Pupils are equal, round, and reactive to light.  Neck: Neck supple.  Cardiovascular: Normal rate, regular rhythm and normal heart sounds.   No murmur heard. Pulmonary/Chest: Effort normal. He has wheezes. He exhibits no tenderness.  Abdominal: Soft. Bowel sounds are normal.  Lymphadenopathy:    He has no cervical adenopathy.  Skin: He is not diaphoretic.          Assessment & Plan:  1. Acute bronchitis Recommended Zithromax 500 mg by mouth daily one, 250 mg by mouth daily 2 through 5. Recommended Mucinex 400 mg every 6 hours as needed for cough. Recommended Hycodan 1 teaspoon every 6-8 hours as needed for cough. Positive patient a shot of Depo-Medrol 60 mg IM times one due to the wheezing that he's experiencing. The patient's blood pressure is elevated today in the office but he has been taking Sudafed. I recommended he check his blood pressure frequently home and notify me of values in one week. If it is consistently greater than 140/90, I would increase his medication. - HYDROcodone-homatropine (HYCODAN) 5-1.5 MG/5ML syrup; Take 5 mLs by mouth every 8 (eight) hours as needed for cough.  Dispense: 120 mL; Refill: 0 - azithromycin (ZITHROMAX) 250 MG tablet; 2 tabs poqday1, 1 tab poqday  2-5  Dispense: 6 tablet; Refill: 0 - methylPREDNISolone acetate (DEPO-MEDROL) injection 60 mg; Inject 1.5 mLs (60 mg total) into the muscle once.

## 2013-07-02 ENCOUNTER — Emergency Department (HOSPITAL_COMMUNITY)
Admission: EM | Admit: 2013-07-02 | Discharge: 2013-07-02 | Disposition: A | Discharge status: Home / Self Care | Payer: 59 | Attending: Emergency Medicine | Admitting: Emergency Medicine

## 2013-07-02 ENCOUNTER — Encounter (HOSPITAL_COMMUNITY): Payer: Self-pay | Admitting: Emergency Medicine

## 2013-07-02 ENCOUNTER — Emergency Department (HOSPITAL_COMMUNITY): Payer: 59

## 2013-07-02 DIAGNOSIS — Z79899 Other long term (current) drug therapy: Secondary | ICD-10-CM | POA: Insufficient documentation

## 2013-07-02 DIAGNOSIS — N201 Calculus of ureter: Secondary | ICD-10-CM | POA: Insufficient documentation

## 2013-07-02 DIAGNOSIS — R319 Hematuria, unspecified: Secondary | ICD-10-CM | POA: Insufficient documentation

## 2013-07-02 DIAGNOSIS — R110 Nausea: Secondary | ICD-10-CM

## 2013-07-02 DIAGNOSIS — I10 Essential (primary) hypertension: Secondary | ICD-10-CM | POA: Insufficient documentation

## 2013-07-02 DIAGNOSIS — E785 Hyperlipidemia, unspecified: Secondary | ICD-10-CM | POA: Insufficient documentation

## 2013-07-02 LAB — CBC WITH DIFFERENTIAL/PLATELET
Basophils Absolute: 0 10*3/uL (ref 0.0–0.1)
Basophils Relative: 0 % (ref 0–1)
EOS ABS: 0.1 10*3/uL (ref 0.0–0.7)
Eosinophils Relative: 1 % (ref 0–5)
HCT: 45 % (ref 39.0–52.0)
Hemoglobin: 15 g/dL (ref 13.0–17.0)
LYMPHS ABS: 1.9 10*3/uL (ref 0.7–4.0)
Lymphocytes Relative: 18 % (ref 12–46)
MCH: 31.6 pg (ref 26.0–34.0)
MCHC: 33.3 g/dL (ref 30.0–36.0)
MCV: 94.7 fL (ref 78.0–100.0)
MONOS PCT: 10 % (ref 3–12)
Monocytes Absolute: 1.1 10*3/uL — ABNORMAL HIGH (ref 0.1–1.0)
Neutro Abs: 7.6 10*3/uL (ref 1.7–7.7)
Neutrophils Relative %: 71 % (ref 43–77)
Platelets: 193 10*3/uL (ref 150–400)
RBC: 4.75 MIL/uL (ref 4.22–5.81)
RDW: 12.7 % (ref 11.5–15.5)
WBC: 10.7 10*3/uL — ABNORMAL HIGH (ref 4.0–10.5)

## 2013-07-02 LAB — BASIC METABOLIC PANEL
BUN: 23 mg/dL (ref 6–23)
CALCIUM: 9 mg/dL (ref 8.4–10.5)
CO2: 26 mEq/L (ref 19–32)
CREATININE: 1.73 mg/dL — AB (ref 0.50–1.35)
Chloride: 97 mEq/L (ref 96–112)
GFR calc Af Amer: 47 mL/min — ABNORMAL LOW (ref >90)
GFR, EST NON AFRICAN AMERICAN: 41 mL/min — AB (ref >90)
Glucose, Bld: 100 mg/dL — ABNORMAL HIGH (ref 70–99)
Potassium: 4.5 mEq/L (ref 3.7–5.3)
Sodium: 138 mEq/L (ref 137–147)

## 2013-07-02 LAB — URINALYSIS, ROUTINE W REFLEX MICROSCOPIC
Bilirubin Urine: NEGATIVE
GLUCOSE, UA: NEGATIVE mg/dL
KETONES UR: NEGATIVE mg/dL
LEUKOCYTES UA: NEGATIVE
Nitrite: NEGATIVE
Protein, ur: NEGATIVE mg/dL
Specific Gravity, Urine: 1.016 (ref 1.005–1.030)
Urobilinogen, UA: 0.2 mg/dL (ref 0.0–1.0)
pH: 5.5 (ref 5.0–8.0)

## 2013-07-02 LAB — URINE MICROSCOPIC-ADD ON

## 2013-07-02 MED ORDER — TAMSULOSIN HCL 0.4 MG PO CAPS: 0.4000 mg | ORAL_CAPSULE | Freq: Every day | ORAL | Status: DC

## 2013-07-02 MED ORDER — KETOROLAC TROMETHAMINE 30 MG/ML IJ SOLN
30.0000 mg | Freq: Once | INTRAMUSCULAR | Status: AC
  Administered 2013-07-02: 30 mg via INTRAVENOUS
  Filled 2013-07-02: qty 1

## 2013-07-02 MED ORDER — OXYCODONE-ACETAMINOPHEN 5-325 MG PO TABS: 1.0000 | ORAL_TABLET | ORAL | Status: DC | PRN

## 2013-07-02 MED ORDER — MORPHINE SULFATE 4 MG/ML IJ SOLN
4.0000 mg | Freq: Once | INTRAMUSCULAR | Status: AC
  Administered 2013-07-02: 4 mg via INTRAVENOUS
  Filled 2013-07-02: qty 1

## 2013-07-02 MED ORDER — ONDANSETRON HCL 4 MG/2ML IJ SOLN
4.0000 mg | Freq: Once | INTRAMUSCULAR | Status: AC
  Administered 2013-07-02: 4 mg via INTRAVENOUS
  Filled 2013-07-02: qty 2

## 2013-07-02 MED ORDER — ONDANSETRON HCL 4 MG PO TABS: 4.0000 mg | ORAL_TABLET | Freq: Four times a day (QID) | ORAL | Status: DC

## 2013-07-02 NOTE — ED Notes (Signed)
Pt states he is having left flank pain that started on Thursday  Pt states he has hx of kidney stones and this feels same  Pt has nausea without vomiting  Pt had script for percocet from his previous stone and has been using that for pain relief at home

## 2013-07-02 NOTE — Discharge Instructions (Signed)
Take the prescribed medication as directed. Continue straining urine at home to monitor for passage of stone. Follow-up with urology-- call and schedule appt. Return to the ED for new or worsening symptoms.

## 2013-07-02 NOTE — ED Provider Notes (Signed)
CSN: [id number]     Arrival date & time 07/02/13  1910 History   First MD Initiated Contact with Patient 07/02/13 2110     Chief Complaint  Patient presents with  . Flank Pain     (Consider location/radiation/quality/duration/timing/severity/associated sxs/prior Treatment) Patient is a 63 y.o. male presenting with flank pain. The history is provided by the patient and medical records.  Flank Pain Associated symptoms include nausea.   This is a 63 year old male with past medical history significant for hypertension, hyperlipidemia, kidney stones, presenting to the ED for left flank pain ongoing for the past 4 days. Patient has a history of kidney stones, states symptoms recently are similar to prior bouts. He endorses associated nausea but no vomiting. He denies fever or chills.  No dysuria or hematuria. He states that his last kidney stone in September 2014. He has required lithotripsy and stenting in the past. Has been straining urine at home-- no noted passage of stone or stone fragments.  Patient has been taking leftover Percocet from prior kidney stone for pain control at home.  Wife notes poor PO intake over the past 2 days due to pain.  Pt is not currently established with urology.  VS stable on arrival.  Past Medical History  Diagnosis Date  . Allergy   . Hypertension   . Elevated lipids   . Hyperlipidemia   . Kidney stone    Past Surgical History  Procedure Laterality Date  . Shoulder surgery    . Hernia repair     Family History  Problem Relation Age of Onset  . Cancer Mother     stomach  . Heart disease Father   . Stroke Father    History  Substance Use Topics  . Smoking status: Never Smoker   . Smokeless tobacco: Never Used  . Alcohol Use: Yes     Comment: occ    Review of Systems  Gastrointestinal: Positive for nausea.  Genitourinary: Positive for flank pain.  All other systems reviewed and are negative.     Allergies  Review of patient's allergies  indicates no known allergies.  Home Medications   Prior to Admission medications   Medication Sig Start Date End Date Taking? Authorizing Provider  benazepril (LOTENSIN) 20 MG tablet Take 1 tablet (20 mg total) by mouth daily. 12/27/12  Yes Donita BrooksWarren T Pickard, MD  ondansetron (ZOFRAN) 4 MG tablet Take 4 mg by mouth every 8 (eight) hours as needed for nausea.   Yes Historical Provider, MD  oxyCODONE-acetaminophen (PERCOCET/ROXICET) 5-325 MG per tablet Take 1 tablet by mouth every 3 (three) hours as needed for severe pain (for pain).   Yes Historical Provider, MD  rosuvastatin (CRESTOR) 20 MG tablet Take 1 tablet (20 mg total) by mouth daily. 12/27/12  Yes Donita BrooksWarren T Pickard, MD   BP 134/91  Pulse 66  Temp(Src) 99.2 F (37.3 C) (Oral)  Resp 20  Wt 227 lb (102.967 kg)  SpO2 92%  Physical Exam  Nursing note and vitals reviewed. Constitutional: He is oriented to person, place, and time. He appears well-developed and well-nourished. No distress.  HENT:  Head: Normocephalic and atraumatic.  Mouth/Throat: Oropharynx is clear and moist.  Eyes: Conjunctivae and EOM are normal. Pupils are equal, round, and reactive to light.  Neck: Normal range of motion. Neck supple.  Cardiovascular: Normal rate, regular rhythm and normal heart sounds.   Pulmonary/Chest: Effort normal and breath sounds normal. No respiratory distress. He has no wheezes.  Abdominal: Soft. Bowel sounds  are normal. There is no tenderness. There is CVA tenderness (left). There is no guarding.  Musculoskeletal: Normal range of motion.  Neurological: He is alert and oriented to person, place, and time.  Skin: Skin is warm and dry. He is not diaphoretic.  Psychiatric: He has a normal mood and affect.    ED Course  Procedures (including critical care time) Labs Review Labs Reviewed  URINALYSIS, ROUTINE W REFLEX MICROSCOPIC - Abnormal; Notable for the following:    Hgb urine dipstick SMALL (*)    All other components within normal  limits  CBC WITH DIFFERENTIAL - Abnormal; Notable for the following:    WBC 10.7 (*)    Monocytes Absolute 1.1 (*)    All other components within normal limits  BASIC METABOLIC PANEL - Abnormal; Notable for the following:    Glucose, Bld 100 (*)    Creatinine, Ser 1.73 (*)    GFR calc non Af Amer 41 (*)    GFR calc Af Amer 47 (*)    All other components within normal limits  URINE MICROSCOPIC-ADD ON    Imaging Review Ct Abdomen Pelvis Wo Contrast  07/02/2013   CLINICAL DATA:  Left flank pain  EXAM: CT ABDOMEN AND PELVIS WITHOUT CONTRAST  TECHNIQUE: Multidetector CT imaging of the abdomen and pelvis was performed following the standard protocol without IV contrast.  COMPARISON:  09/29/2012  FINDINGS: No pleural effusion. Pulmonary nodules in the right middle lobe and both lower lobes are again noted and appear unchanged. Index nodule in the left lower lobe measures 6 mm and is stable from previous exam, image 7/series 2. Index nodule within the right lower lobe is unchanged measuring 6 mm, image 1/series 2. 5 mm nodule in the medial right lower lobe is stable, image 5/series 2.  No focal liver abnormality identified. The gallbladder is normal. No biliary dilatation. Normal appearance of the pancreas. The spleen is on unremarkable.  The adrenal glands are both normal. Normal appearance of the right kidney. There is asymmetric left-sided hydronephrosis and hydroureter. Stone at the right stress set stone at the left UPJ measures 3 mm, image 81/series 2. Tiny stones are noted within the dependent portion of the urinary bladder.  Calcified atherosclerotic disease involves the abdominal aorta. Small retroperitoneal lymph nodes are identified. No adenopathy. There is no upper abdominal or pelvic adenopathy identified. No pelvic or inguinal adenopathy identified.  The stomach appears normal. The small bowel loops have a normal course and caliber. No evidence for bowel obstruction. The appendix is visualized  and appears normal. Moderate stool burden identified within the colon.  Review of the visualized bony structures is significant for multi level lumbar spondylosis. Bilateral L5 pars defects are noted. A first degree anterolisthesis of L5 on S1 is noted.  IMPRESSION: 1. Left-sided hydronephrosis and hydroureter is identified. This is secondary to a 3 mm left UPJ calculus. 2. Atherosclerotic disease including multi vessel coronary artery calcifications. 3. No change in small pulmonary nodules in both lower lobes. A followup CT of the chest at 12 months is advised to document continued stability. 4. Bilateral L5 pars defects with anterolisthesis of L5 on S1.   Electronically Signed   By: Signa Kell M.D.   On: 07/02/2013 22:36     EKG Interpretation None      MDM   Final diagnoses:  Left ureteral stone  Hematuria  Nausea   63 y.o. M with hx of kidney stones presenting with sx consistent with prior bouts of stones.  On exam, afebrile and non-toxic appearing.  TTP of left flank, remainder of abdominal exam benign.  Plan for labs, u/a, likely CT w/o contrast.  Labs with a slight bump in creatinine when compared with previous, likely secondary to poor fluid intake.  U/A with small blood.  CT revealing left-sided hydronephrosis and hydroureter secondary to 3 mm left UPJ calculus.  After pain meds her pain is significantly improved, patient resting comfortably in bed. He is tolerated by PO without difficulty.  He remains afebrile and non-toxic appearing.  Pt discharged home with percocet, zofran, flomax.  Will continue straining urine to monitor for passage of stone.  FU with urology.  Discussed plan with patient, he/she acknowledged understanding and agreed with plan of care.  Return precautions given for new or worsening symptoms.  Garlon HatchetLisa M Sanders, PA-C 07/03/13 0017

## 2013-07-03 NOTE — ED Provider Notes (Signed)
Medical screening examination/treatment/procedure(s) were performed by non-physician practitioner and as supervising physician I was immediately available for consultation/collaboration.   EKG Interpretation None        Lyanne CoKevin M Campos, MD 07/03/13 0028

## 2013-09-05 ENCOUNTER — Other Ambulatory Visit: Payer: Self-pay | Admitting: Family Medicine

## 2013-09-05 NOTE — Telephone Encounter (Signed)
Refill appropriate and filled per protocol. 

## 2013-12-06 ENCOUNTER — Other Ambulatory Visit: Payer: Self-pay | Admitting: Family Medicine

## 2013-12-06 MED ORDER — ROSUVASTATIN CALCIUM 20 MG PO TABS: 20.0000 mg | ORAL_TABLET | Freq: Every day | ORAL | Status: DC

## 2013-12-06 NOTE — Telephone Encounter (Signed)
Crestor sent to pharm and will need ov and labs before further refills

## 2014-01-05 HISTORY — PX: OTHER SURGICAL HISTORY: SHX169

## 2014-01-18 ENCOUNTER — Other Ambulatory Visit: Payer: Self-pay | Admitting: Family Medicine

## 2014-01-18 ENCOUNTER — Encounter: Payer: Self-pay | Admitting: Family Medicine

## 2014-01-18 NOTE — Telephone Encounter (Signed)
Medication refill for one time only.  Patient needs to be seen.  Letter sent for patient to call and schedule. It has been over one year since last routine office visit and lab work.

## 2014-01-30 ENCOUNTER — Encounter: Payer: Self-pay | Admitting: Family Medicine

## 2014-01-30 ENCOUNTER — Ambulatory Visit (INDEPENDENT_AMBULATORY_CARE_PROVIDER_SITE_OTHER): Payer: 59 | Admitting: Family Medicine

## 2014-01-30 VITALS — BP 128/72 | HR 86 | Temp 98.0°F | Resp 18 | Ht 69.0 in | Wt 236.0 lb

## 2014-01-30 DIAGNOSIS — M4306 Spondylolysis, lumbar region: Secondary | ICD-10-CM | POA: Insufficient documentation

## 2014-01-30 DIAGNOSIS — I10 Essential (primary) hypertension: Secondary | ICD-10-CM

## 2014-01-30 DIAGNOSIS — E785 Hyperlipidemia, unspecified: Secondary | ICD-10-CM

## 2014-01-30 DIAGNOSIS — R918 Other nonspecific abnormal finding of lung field: Secondary | ICD-10-CM | POA: Insufficient documentation

## 2014-01-30 NOTE — Progress Notes (Signed)
   Subjective:    Patient ID: Shane Robinson, Shane Robinson    DOB: 1950-02-11, 64 y.o.   MRN: [id number]  HPI Patient is here today for a refill on his blood pressure medication as well as his cholesterol medication. Since I last saw the patient in 2014, he went to the emergency room in June with a 3 mm kidney stone. Of note he had obstructive uropathy with a creatinine of 1.73. No one has checked his creatinine since. It did seem to be related to his kidney stone as his previous creatinine 2014 was normal. He also had a questionable finding of pulmonary nodules that were stable in appearance as well as a bilateral L5 pars defects which are asymptomatic. He denies any chest pain shortness of breath or dyspnea on exertion. He denies any myalgias or right upper quadrant pain. He is due for a flu shot. Past Medical History  Diagnosis Date  . Allergy   . Hypertension   . Elevated lipids   . Hyperlipidemia   . Kidney stone   . Pulmonary nodules   . Lumbar pars defect    Past Surgical History  Procedure Laterality Date  . Shoulder surgery    . Hernia repair     Current Outpatient Prescriptions on File Prior to Visit  Medication Sig Dispense Refill  . benazepril (LOTENSIN) 20 MG tablet TAKE ONE TABLET BY MOUTH ONCE DAILY 30 tablet 0  . rosuvastatin (CRESTOR) 20 MG tablet Take 1 tablet (20 mg total) by mouth daily. 90 tablet 0   No current facility-administered medications on file prior to visit.   No Known Allergies History   Social History  . Marital Status: Single    Spouse Name: N/A    Number of Children: N/A  . Years of Education: N/A   Occupational History  . Not on file.   Social History Main Topics  . Smoking status: Never Smoker   . Smokeless tobacco: Never Used  . Alcohol Use: Yes     Comment: occ  . Drug Use: No  . Sexual Activity: Not on file     Comment: married, dairy farmer   Other Topics Concern  . Not on file   Social History Narrative      Review of Systems    All other systems reviewed and are negative.      Objective:   Physical Exam  Cardiovascular: Normal rate, regular rhythm and normal heart sounds.   Pulmonary/Chest: Effort normal and breath sounds normal. No respiratory distress. He has no wheezes. He has no rales.  Abdominal: Soft. Bowel sounds are normal. He exhibits no distension. There is no tenderness. There is no rebound and no guarding.  Vitals reviewed.         Assessment & Plan:  Benign essential HTN - Plan: COMPLETE METABOLIC PANEL WITH GFR, Lipid panel  HLD (hyperlipidemia) - Plan: COMPLETE METABOLIC PANEL WITH GFR, Lipid panel  His blood pressure is excellent. I will make no changes in his benazepril however I would like to recheck his renal function. If his renal function has remained elevated we will need to begin further workup. I would also like the patient to return fasting for a CMP as well as a fasting lipid panel. Goal LDL cholesterol is well below 100 given the coronary artery calcification been on his CT scan. I recommend a repeat chest CT in 12 months to monitor his pulmonary nodules. Patient received his flu shot today

## 2014-01-31 ENCOUNTER — Other Ambulatory Visit: Payer: 59

## 2014-01-31 DIAGNOSIS — I10 Essential (primary) hypertension: Secondary | ICD-10-CM

## 2014-01-31 DIAGNOSIS — E785 Hyperlipidemia, unspecified: Secondary | ICD-10-CM

## 2014-01-31 LAB — LIPID PANEL
CHOLESTEROL: 121 mg/dL (ref 0–200)
HDL: 41 mg/dL (ref >39)
LDL Cholesterol: 66 mg/dL (ref 0–99)
TRIGLYCERIDES: 68 mg/dL (ref <150)
Total CHOL/HDL Ratio: 3 Ratio
VLDL: 14 mg/dL (ref 0–40)

## 2014-01-31 LAB — COMPLETE METABOLIC PANEL WITH GFR
ALBUMIN: 4 g/dL (ref 3.5–5.2)
ALT: 32 U/L (ref 0–53)
AST: 32 U/L (ref 0–37)
Alkaline Phosphatase: 43 U/L (ref 39–117)
BUN: 18 mg/dL (ref 6–23)
CALCIUM: 9 mg/dL (ref 8.4–10.5)
CHLORIDE: 105 meq/L (ref 96–112)
CO2: 25 meq/L (ref 19–32)
Creat: 0.93 mg/dL (ref 0.50–1.35)
GFR, Est Non African American: 88 mL/min
Glucose, Bld: 77 mg/dL (ref 70–99)
POTASSIUM: 4.5 meq/L (ref 3.5–5.3)
Sodium: 141 mEq/L (ref 135–145)
Total Bilirubin: 0.7 mg/dL (ref 0.2–1.2)
Total Protein: 7 g/dL (ref 6.0–8.3)

## 2014-02-01 ENCOUNTER — Encounter: Payer: Self-pay | Admitting: *Deleted

## 2014-03-02 ENCOUNTER — Other Ambulatory Visit: Payer: Self-pay | Admitting: Family Medicine

## 2014-03-05 ENCOUNTER — Other Ambulatory Visit: Payer: Self-pay | Admitting: Family Medicine

## 2014-03-05 MED ORDER — ROSUVASTATIN CALCIUM 20 MG PO TABS: 20.0000 mg | ORAL_TABLET | Freq: Every day | ORAL | Status: DC

## 2014-03-05 NOTE — Telephone Encounter (Signed)
Med sent to pharm 

## 2014-03-07 ENCOUNTER — Telehealth: Payer: Self-pay | Admitting: *Deleted

## 2014-03-07 NOTE — Telephone Encounter (Signed)
Received PA determination.   Advised that medication is on prescription formulary.

## 2014-03-07 NOTE — Telephone Encounter (Signed)
Received request from pharmacy for PA on Crestor.   PA submitted.   Dx: mixed hyperlipidemia (E78.5)

## 2014-03-23 ENCOUNTER — Other Ambulatory Visit: Payer: Self-pay | Admitting: Orthopedic Surgery

## 2014-03-23 DIAGNOSIS — R609 Edema, unspecified: Secondary | ICD-10-CM

## 2014-03-23 DIAGNOSIS — R52 Pain, unspecified: Secondary | ICD-10-CM

## 2014-03-28 ENCOUNTER — Ambulatory Visit
Admission: RE | Admit: 2014-03-28 | Discharge: 2014-03-28 | Disposition: A | Discharge status: Home / Self Care | Payer: 59 | Source: Ambulatory Visit | Attending: Orthopedic Surgery | Admitting: Orthopedic Surgery

## 2014-03-28 DIAGNOSIS — R609 Edema, unspecified: Secondary | ICD-10-CM

## 2014-03-28 DIAGNOSIS — R52 Pain, unspecified: Secondary | ICD-10-CM

## 2014-04-09 ENCOUNTER — Encounter: Payer: Self-pay | Admitting: Family Medicine

## 2014-04-09 ENCOUNTER — Ambulatory Visit (INDEPENDENT_AMBULATORY_CARE_PROVIDER_SITE_OTHER): Payer: 59 | Admitting: Family Medicine

## 2014-04-09 VITALS — BP 140/70 | HR 88 | Temp 98.7°F | Resp 20 | Ht 69.0 in | Wt 235.0 lb

## 2014-04-09 DIAGNOSIS — J208 Acute bronchitis due to other specified organisms: Secondary | ICD-10-CM

## 2014-04-09 MED ORDER — AZITHROMYCIN 250 MG PO TABS: ORAL_TABLET | ORAL | Status: DC

## 2014-04-09 MED ORDER — HYDROCODONE-HOMATROPINE 5-1.5 MG/5ML PO SYRP: 5.0000 mL | ORAL_SOLUTION | Freq: Three times a day (TID) | ORAL | Status: DC | PRN

## 2014-04-09 NOTE — Progress Notes (Signed)
Subjective:    Patient ID: Shane Robinson, male    DOB: 08-14-1950, 64 y.o.   MRN: [id number]  HPI  Patient reports a four-day history of severe cough. The cough is productive of green sputum. He reports subjective fevers. He denies any chest pain. He denies any hemoptysis. He denies any shortness of breath. He denies any rhinorrhea. He does have some sinus pressure and sinus pain. As occasional postnasal drip. He is scheduled for knee surgery on Wednesday Past Medical History  Diagnosis Date  . Allergy   . Hypertension   . Elevated lipids   . Hyperlipidemia   . Kidney stone   . Pulmonary nodules   . Lumbar pars defect    Past Surgical History  Procedure Laterality Date  . Shoulder surgery    . Hernia repair     Current Outpatient Prescriptions on File Prior to Visit  Medication Sig Dispense Refill  . benazepril (LOTENSIN) 20 MG tablet TAKE ONE TABLET BY MOUTH ONCE DAILY 30 tablet 0  . benazepril (LOTENSIN) 20 MG tablet TAKE ONE TABLET BY MOUTH ONCE DAILY 90 tablet 1  . rosuvastatin (CRESTOR) 20 MG tablet Take 1 tablet (20 mg total) by mouth daily. 90 tablet 1  . tamsulosin (FLOMAX) 0.4 MG CAPS capsule Take 0.4 mg by mouth.     No current facility-administered medications on file prior to visit.   No Known Allergies History   Social History  . Marital Status: Single    Spouse Name: N/A  . Number of Children: N/A  . Years of Education: N/A   Occupational History  . Not on file.   Social History Main Topics  . Smoking status: Never Smoker   . Smokeless tobacco: Never Used  . Alcohol Use: Yes     Comment: occ  . Drug Use: No  . Sexual Activity: Not on file     Comment: married, dairy farmer   Other Topics Concern  . Not on file   Social History Narrative     Review of Systems  All other systems reviewed and are negative.      Objective:   Physical Exam  Constitutional: He appears well-developed and well-nourished.  HENT:  Right Ear: External ear  normal.  Left Ear: External ear normal.  Nose: Nose normal.  Mouth/Throat: Oropharynx is clear and moist. No oropharyngeal exudate.  Eyes: Conjunctivae are normal.  Neck: Neck supple.  Cardiovascular: Normal rate, regular rhythm and normal heart sounds.   No murmur heard. Pulmonary/Chest: Effort normal and breath sounds normal. No respiratory distress. He has no wheezes. He has no rales. He exhibits no tenderness.  Lymphadenopathy:    He has no cervical adenopathy.  Vitals reviewed.         Assessment & Plan:  Acute bronchitis due to other specified organisms - Plan: azithromycin (ZITHROMAX) 250 MG tablet, HYDROcodone-homatropine (HYCODAN) 5-1.5 MG/5ML syrup  Patient has bronchitis. I explained to the patient I believe this is a virus. I believe he will be better by Friday or Monday of next week regardless of what interventions we undertake today. I do not think he needs an antibiotic. However the patient is very concerned that he needs to be better by his upcoming surgery and would like to try a Z-Pak anyway. I will prescribe the patient a Z-Pak. I also give him Hycodan 1 teaspoon every 8 hours as needed for coughing. I also recommended that he call his surgeon and let him know that he does have  bronchitis in case the surgeon wants to postpone his planned knee surgery.

## 2014-04-13 ENCOUNTER — Telehealth: Payer: Self-pay | Admitting: Family Medicine

## 2014-04-13 NOTE — Telephone Encounter (Signed)
[phone] [facility]  PT was seen on Monday by Dr Tanya NonesPickard for cold and he had knee surgery on Wednesday. He states that he is still coughing a lot and what he is coughing up is a brownish color. He is on last day of his zpac and would like to know if there is something else that he could be given or do to get this to go away.

## 2014-04-13 NOTE — Telephone Encounter (Signed)
Pt aware of recommendations and is taking the cough syrup and mucinex

## 2014-04-13 NOTE — Telephone Encounter (Signed)
If no better on Z-Pak is likely a virus.  Your antibiotics will not be helpful. Unless he is having severe fevers or shortness of breath or chest pain it will take tincture of time. Supportive care is the only thing we can do. Is he not using Hycodan cough syrup?

## 2014-04-16 DIAGNOSIS — Z9889 Other specified postprocedural states: Secondary | ICD-10-CM | POA: Insufficient documentation

## 2014-06-11 ENCOUNTER — Encounter: Payer: Self-pay | Admitting: Family Medicine

## 2014-07-03 ENCOUNTER — Ambulatory Visit (HOSPITAL_COMMUNITY): Payer: 59

## 2014-07-03 ENCOUNTER — Other Ambulatory Visit (HOSPITAL_COMMUNITY): Payer: Self-pay | Admitting: Orthopedic Surgery

## 2014-07-03 DIAGNOSIS — R609 Edema, unspecified: Secondary | ICD-10-CM

## 2014-07-05 ENCOUNTER — Ambulatory Visit (HOSPITAL_COMMUNITY)
Admission: RE | Admit: 2014-07-05 | Discharge: 2014-07-05 | Disposition: A | Discharge status: Home / Self Care | Payer: 59 | Source: Ambulatory Visit | Attending: Orthopedic Surgery | Admitting: Orthopedic Surgery

## 2014-07-05 DIAGNOSIS — M79604 Pain in right leg: Secondary | ICD-10-CM | POA: Diagnosis present

## 2014-07-05 DIAGNOSIS — R609 Edema, unspecified: Secondary | ICD-10-CM | POA: Diagnosis not present

## 2014-07-05 NOTE — Progress Notes (Signed)
VASCULAR LAB PRELIMINARY  PRELIMINARY  PRELIMINARY  PRELIMINARY  Right lower extremity venous Doppler completed.    Preliminary report:  There is no DVT or SVT noted in the right lower extremity.   Ilina Xu, RVT 07/05/2014, 1:51 PM

## 2014-07-31 DIAGNOSIS — M1711 Unilateral primary osteoarthritis, right knee: Secondary | ICD-10-CM | POA: Diagnosis present

## 2014-11-11 ENCOUNTER — Other Ambulatory Visit: Payer: Self-pay | Admitting: Family Medicine

## 2014-11-12 NOTE — Telephone Encounter (Signed)
Script sent to pharmacy, pt ov before further refills

## 2014-11-15 ENCOUNTER — Other Ambulatory Visit: Payer: Self-pay | Admitting: Family Medicine

## 2014-11-15 MED ORDER — ROSUVASTATIN CALCIUM 20 MG PO TABS: 20.0000 mg | ORAL_TABLET | Freq: Every day | ORAL | Status: DC

## 2014-11-15 NOTE — Telephone Encounter (Signed)
Medication called/sent to requested pharmacy  

## 2014-12-25 IMAGING — CT CT ABD-PELV W/O CM
2 of 4 series · 16 of 46 positions shown, 18 images · non-contrast
Comparison: None.

CLINICAL DATA: Left flank pain

EXAM:
CT ABDOMEN AND PELVIS WITHOUT CONTRAST
TECHNIQUE: Multidetector CT imaging of the abdomen and pelvis was performed
following the standard protocol without intravenous contrast.

[Series 2: routine · axial · 0.84mm/px · z∈[-477,-27]mm · 13 of 100 slices shown, 15 images]
[im 5/100  soft-tissue]
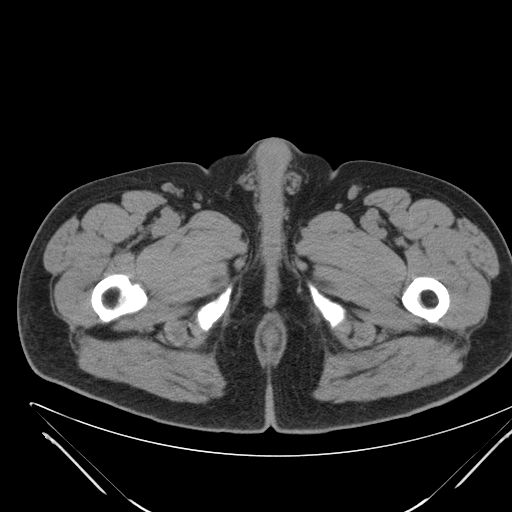
[im 5/100  bone]
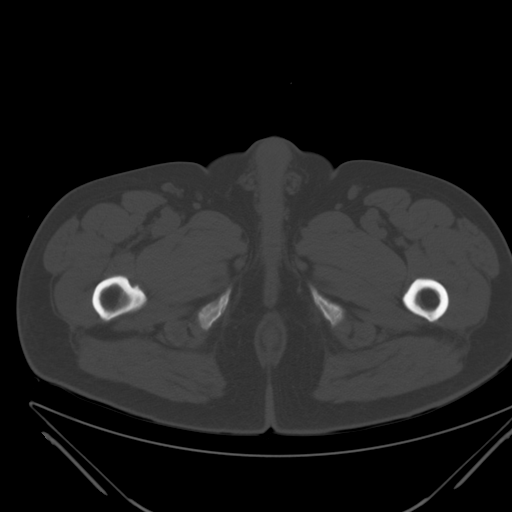
[im 13/100  soft-tissue]
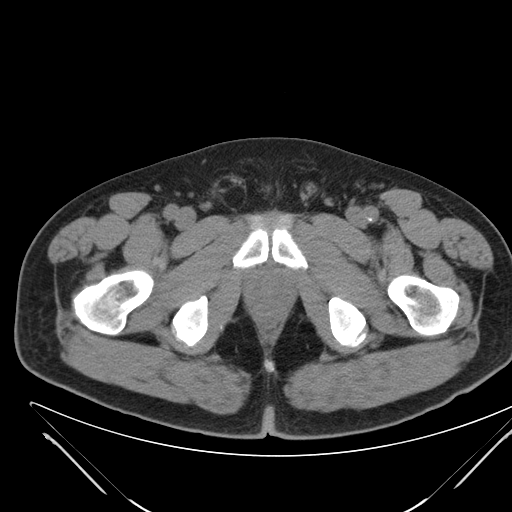
[im 22/100  soft-tissue]
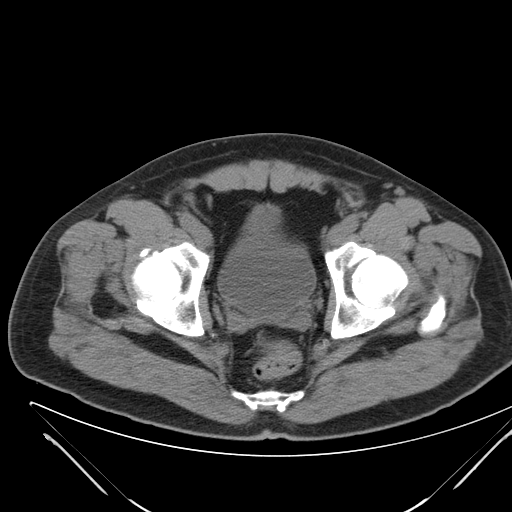
[im 26/100  soft-tissue]
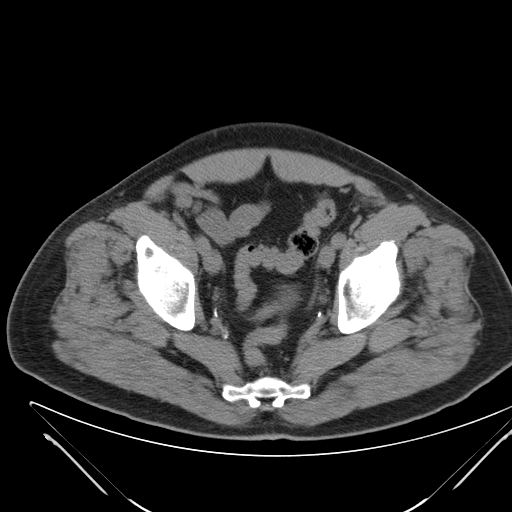
[im 35/100  soft-tissue]
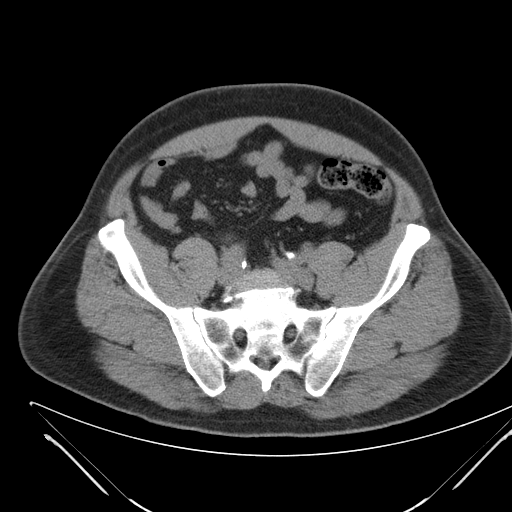
[im 44/100  soft-tissue]
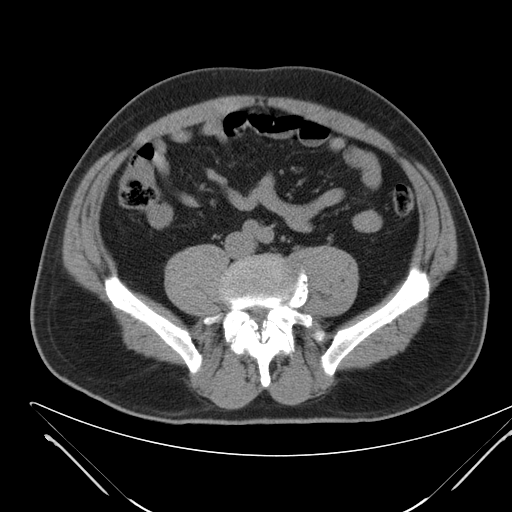
[im 52/100  soft-tissue]
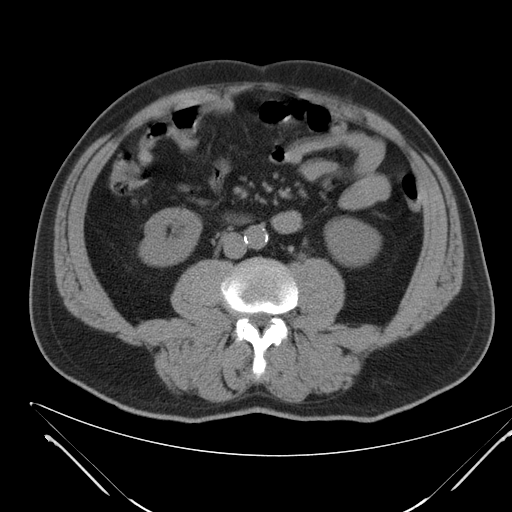
[im 56/100  soft-tissue]
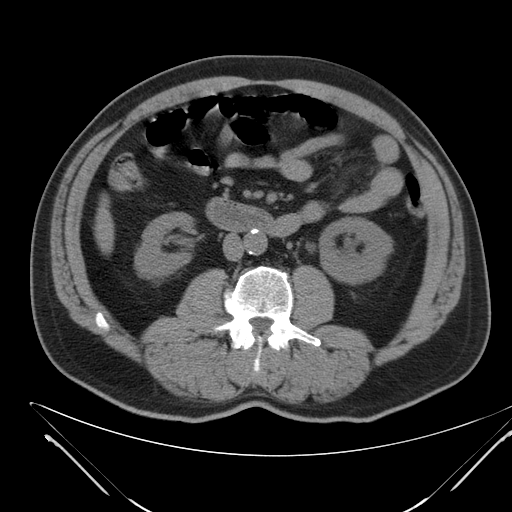
[im 65/100  soft-tissue]
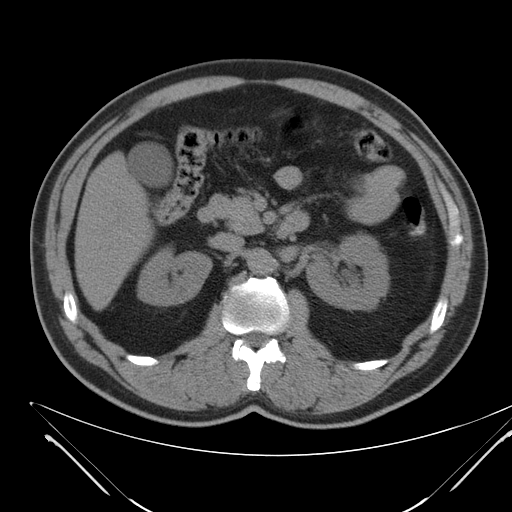
[im 65/100  bone]
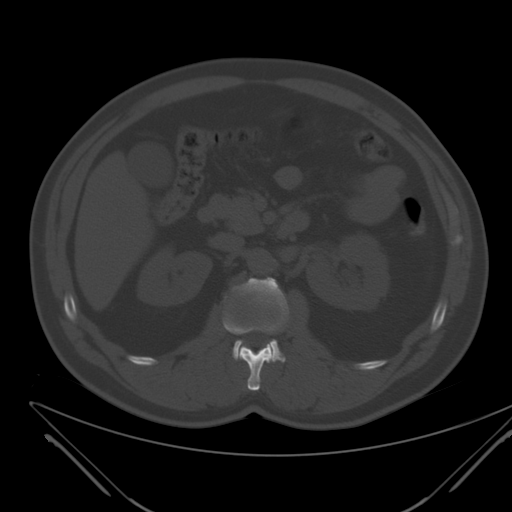
[im 74/100  soft-tissue]
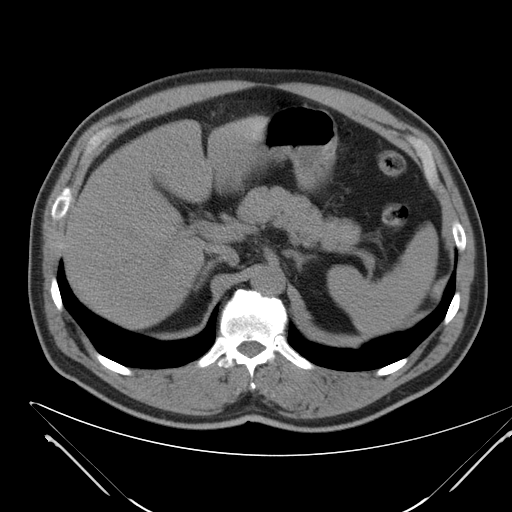
[im 78/100  soft-tissue]
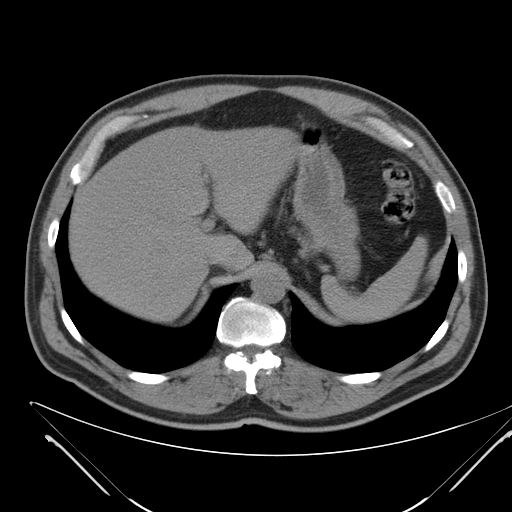
[im 87/100  soft-tissue]
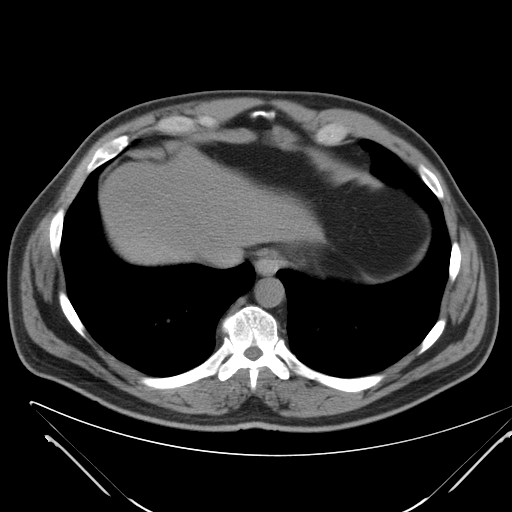
[im 95/100  soft-tissue]
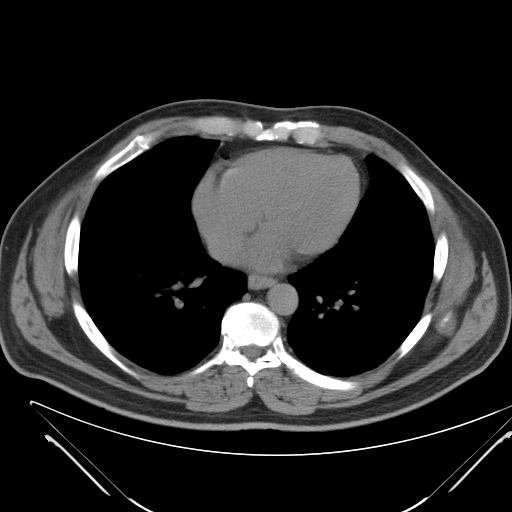

[cor · coronal · 0.97mm/px · 3 of 137 slices shown]
[im 46/137  soft-tissue]
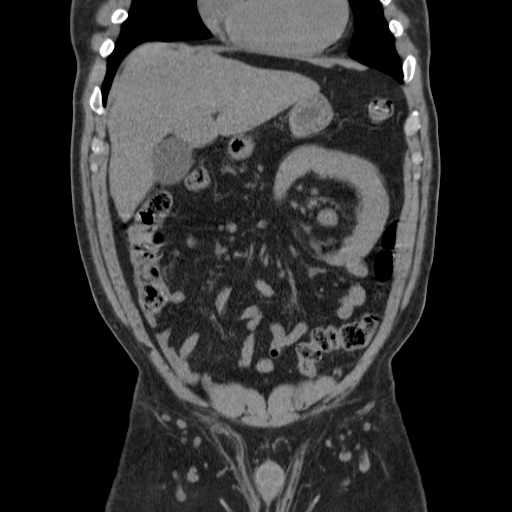
[im 61/137  soft-tissue]
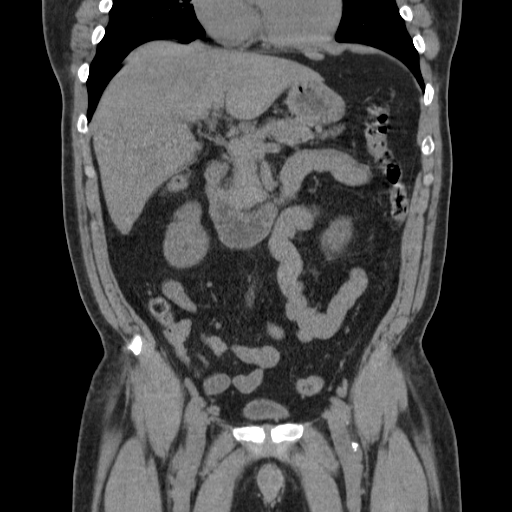
[im 76/137  soft-tissue]
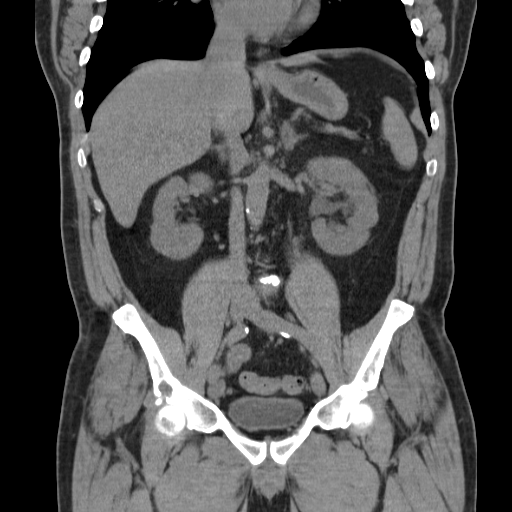

[16 of 46 positions shown; findings below may reference images not displayed]

FINDINGS: Sagittal images of the spine shows degenerative changes
thoracolumbar spine. Probable hemangioma L2 vertebral body.
Significant disc space flattening with vacuum disc phenomenon at
L5-S1 level. Bilateral pars defect at L5 level. About 9 mm
anterolisthesis L5 on S1 vertebral body.

Axial image 6 there is 9 mm nodule in right lower lobe laterally. A
2nd nodule in right lower lobe laterally measures 5 mm. Nodule in
left lower lobe laterally measures 7 mm. Follow-up CT scan of the
chest in 3 months is recommended to assure stability.

Mild atherosclerotic calcifications of abdominal aorta and iliac
arteries. No aortic aneurysm.

No definite calcified gallstones are noted within gallbladder.

Unenhanced liver shows no biliary ductal dilatation. The pancreas,
spleen and adrenals are unremarkable. There is mild left
hydronephrosis and left hydroureter. Mild left perinephric and
periureteral stranding. No proximal ureteral calculi are noted
bilaterally.

In axial image 80 there is poorly visualized 2 mm calcified calculus
in left UVJ region. This is best visualized in coronal image 96.
Tiny punctate calcification noted within prostate gland centrally.
No pericecal inflammation. Normal appendix is clearly visualize in
axial image 67. There is a right inguinal scrotal carinal hernia
containing fat without evidence of acute complication. Measures 3
cm. The
IMPRESSION: 1. There is mild left hydronephrosis and minimal left hydroureter.
Mild left perinephric and periureteral stranding.
2. There is poorly visualized 2 mm calcified calculus in left UVJ.
Best seen in coronal image 96.
3. No pericecal inflammation. Normal appendix.
4. Degenerative changes lumbar spine. About 9 mm anterolisthesis L5
on S1 vertebral body. Bilateral pars defect at L5 level.
5. Axial image 6 there is 9 mm nodule in right lower lobe laterally.
A 2nd nodule in right lower lobe laterally measures 5 mm. Nodule in
left lower lobe laterally measures 7 mm. Follow-up CT scan of the
chest in 3 months is recommended to assure stability.

## 2015-03-07 ENCOUNTER — Other Ambulatory Visit: Payer: Self-pay | Admitting: Family Medicine

## 2015-03-07 NOTE — Telephone Encounter (Signed)
Medication filled x1 with no refills.   Requires office visit before any further refills can be given.   Letter sent.  

## 2015-03-08 ENCOUNTER — Ambulatory Visit (INDEPENDENT_AMBULATORY_CARE_PROVIDER_SITE_OTHER): Payer: BC Managed Care – PPO | Admitting: Family Medicine

## 2015-03-08 ENCOUNTER — Encounter: Payer: Self-pay | Admitting: Family Medicine

## 2015-03-08 VITALS — BP 150/90 | HR 84 | Temp 97.9°F | Resp 18 | Ht 69.0 in | Wt 234.0 lb

## 2015-03-08 DIAGNOSIS — I10 Essential (primary) hypertension: Secondary | ICD-10-CM

## 2015-03-08 LAB — BASIC METABOLIC PANEL WITH GFR
BUN: 16 mg/dL (ref 7–25)
CO2: 25 mmol/L (ref 20–31)
Calcium: 9.1 mg/dL (ref 8.6–10.3)
Chloride: 105 mmol/L (ref 98–110)
Creat: 0.94 mg/dL (ref 0.70–1.25)
GFR, EST NON AFRICAN AMERICAN: 86 mL/min (ref >=60)
GLUCOSE: 95 mg/dL (ref 70–99)
POTASSIUM: 4.4 mmol/L (ref 3.5–5.3)
Sodium: 140 mmol/L (ref 135–146)

## 2015-03-08 MED ORDER — BENAZEPRIL HCL 20 MG PO TABS: 20.0000 mg | ORAL_TABLET | Freq: Every day | ORAL | Status: DC

## 2015-03-08 NOTE — Progress Notes (Signed)
   Subjective:    Patient ID: Shane Robinson, male    DOB: Jul 03, 1950, 65 y.o.   MRN: [id number]  HPI  patient's blood pressure medicine ran out more than 1 month ago. He stopped the medication independently. Since that time his blood pressure has been running elevated at 150-170 over 90s. He denies any chest pain shortness of breath or dyspnea on exertion. His only complaint are pins and needles discomfort in the soles of his feet at night consistent with peripheral neuropathy. He also reports low back pain with sciatica type symptoms in his left leg. He is overdue for fasting lab work but he has eaten today. Past Medical History  Diagnosis Date  . Allergy   . Hypertension   . Elevated lipids   . Hyperlipidemia   . Kidney stone   . Pulmonary nodules   . Lumbar pars defect    Past Surgical History  Procedure Laterality Date  . Shoulder surgery    . Hernia repair     Current Outpatient Prescriptions on File Prior to Visit  Medication Sig Dispense Refill  . rosuvastatin (CRESTOR) 20 MG tablet Take 1 tablet (20 mg total) by mouth daily. (Needs office visit and labs before further refills) 90 tablet 0   No current facility-administered medications on file prior to visit.   Allergies  Allergen Reactions  . Latex Hives   Social History   Social History  . Marital Status: Married    Spouse Name: N/A  . Number of Children: N/A  . Years of Education: N/A   Occupational History  . Not on file.   Social History Main Topics  . Smoking status: Never Smoker   . Smokeless tobacco: Never Used  . Alcohol Use: Yes     Comment: occ  . Drug Use: No  . Sexual Activity: Not on file     Comment: married, dairy farmer   Other Topics Concern  . Not on file   Social History Narrative      Review of Systems  All other systems reviewed and are negative.      Objective:   Physical Exam  Cardiovascular: Normal rate, regular rhythm and normal heart sounds.   Pulmonary/Chest: Effort  normal and breath sounds normal. No respiratory distress. He has no wheezes. He has no rales.  Abdominal: Soft. Bowel sounds are normal. He exhibits no distension. There is no tenderness. There is no rebound.  Musculoskeletal: He exhibits no edema.  Neurological: He is alert. He has normal reflexes. He exhibits normal muscle tone.  Vitals reviewed.         Assessment & Plan:  Benign essential HTN - Plan: BASIC METABOLIC PANEL WITH GFR   blood pressures elevated. Resume benazepril 20 mg by mouth daily and recheck blood pressure in one month. I will check a BMP today to assess for safety of the medication. I would like him to return fasting for fasting lipid panel a complete physical exam later this year.

## 2015-03-28 ENCOUNTER — Other Ambulatory Visit: Payer: Self-pay | Admitting: Family Medicine

## 2015-03-29 NOTE — Telephone Encounter (Signed)
Refill appropriate and filled per protocol. 

## 2015-04-03 ENCOUNTER — Other Ambulatory Visit: Payer: Self-pay | Admitting: Family Medicine

## 2015-04-03 MED ORDER — ROSUVASTATIN CALCIUM 20 MG PO TABS: 20.0000 mg | ORAL_TABLET | Freq: Every day | ORAL | Status: DC

## 2015-04-05 ENCOUNTER — Telehealth: Payer: Self-pay | Admitting: Family Medicine

## 2015-04-05 ENCOUNTER — Other Ambulatory Visit: Payer: Self-pay | Admitting: Family Medicine

## 2015-04-05 MED ORDER — ROSUVASTATIN CALCIUM 20 MG PO TABS: 20.0000 mg | ORAL_TABLET | Freq: Every day | ORAL | Status: DC

## 2015-04-05 NOTE — Telephone Encounter (Signed)
Sharp back pain, like a knife.  Went to Urology, thought was kidney stone.  It was not.  Pain comes and goes.  Pain comes and goes just below left shoulder blade.  What can he do to ease this over the weekend.

## 2015-04-05 NOTE — Telephone Encounter (Signed)
Pt said pharmacy never got his refill of Crestor.  Rx sent again.

## 2015-04-08 MED ORDER — HYDROCODONE-ACETAMINOPHEN 5-325 MG PO TABS: 1.0000 | ORAL_TABLET | Freq: Four times a day (QID) | ORAL | Status: DC | PRN

## 2015-04-08 NOTE — Telephone Encounter (Signed)
I just got to message now.  He can have norco 5/325 1-2 every 6 hours as needed for pain (30).

## 2015-04-08 NOTE — Telephone Encounter (Signed)
Left message for pt to call me back.  Rx printed

## 2015-04-09 NOTE — Telephone Encounter (Signed)
Spoke with wife this morning.  Pt went to see [facility] yesterday.  RX for some Prednisone.  Felt it was back spasms.  Told wife there was a prescription here for some pain medication.  Apologized for our delayed response to his issue.

## 2015-08-20 ENCOUNTER — Encounter: Payer: Self-pay | Admitting: Family Medicine

## 2015-09-20 ENCOUNTER — Ambulatory Visit (INDEPENDENT_AMBULATORY_CARE_PROVIDER_SITE_OTHER): Payer: BC Managed Care – PPO | Admitting: Family Medicine

## 2015-09-20 ENCOUNTER — Encounter: Payer: Self-pay | Admitting: Family Medicine

## 2015-09-20 VITALS — BP 124/82 | HR 80 | Temp 98.3°F | Resp 14 | Ht 69.0 in | Wt 229.0 lb

## 2015-09-20 DIAGNOSIS — G629 Polyneuropathy, unspecified: Secondary | ICD-10-CM

## 2015-09-20 DIAGNOSIS — R918 Other nonspecific abnormal finding of lung field: Secondary | ICD-10-CM

## 2015-09-20 LAB — CBC WITH DIFFERENTIAL/PLATELET
BASOS ABS: 0 {cells}/uL (ref 0–200)
Basophils Relative: 0 %
EOS PCT: 3 %
Eosinophils Absolute: 210 cells/uL (ref 15–500)
HCT: 44.6 % (ref 38.5–50.0)
HEMOGLOBIN: 15 g/dL (ref 13.0–17.0)
LYMPHS PCT: 41 %
Lymphs Abs: 2870 cells/uL (ref 850–3900)
MCH: 31.3 pg (ref 27.0–33.0)
MCHC: 33.6 g/dL (ref 32.0–36.0)
MCV: 92.9 fL (ref 80.0–100.0)
MONOS PCT: 7 %
MPV: 11.2 fL (ref 7.5–12.5)
Monocytes Absolute: 490 cells/uL (ref 200–950)
NEUTROS PCT: 49 %
Neutro Abs: 3430 cells/uL (ref 1500–7800)
PLATELETS: 214 10*3/uL (ref 140–400)
RBC: 4.8 MIL/uL (ref 4.20–5.80)
RDW: 13.2 % (ref 11.0–15.0)
WBC: 7 10*3/uL (ref 3.8–10.8)

## 2015-09-20 LAB — VITAMIN B12: Vitamin B-12: 910 pg/mL (ref 200–1100)

## 2015-09-20 LAB — TSH: TSH: 3.15 mIU/L (ref 0.40–4.50)

## 2015-09-20 NOTE — Progress Notes (Signed)
Subjective:    Patient ID: Shane Robinson, male    DOB: 11-06-1950, 65 y.o.   MRN: [id number]  HPI Patient has medial compartment osteoarthritis. He is requesting a specific knee brace to help treat this. Recent evidence shows that a valgus unloader knee brace can help treat the pain associated with osteoarthritis and therefore I provided the patient with a prescription for this today. He also reports burning and stinging pain in the plantar aspects of both feet. Examination of the feet today reveals no tender spots on exam. He has normal pulses at the dorsalis pedis and posterior tibialis bilaterally. There is no rash or swelling. Pain is burning and stinging. Sounds neuropathic. Pain is worse when he tries to sleep at night. There are no alleviating factors. He also reports pain deep in the mid back underneath his left scapula. He also reports pleurisy. It is been present for 6 months. He denies any hemoptysis or fever. He denies any cough. He does have an underlying history of pulmonary nodules. CT of the abdomen in 2015 revealed: Pulmonary nodules in the right middle lobe and both lower lobes are again noted and appear unchanged. Index nodule in the left lower lobe measures 6 mm and is stable from previous exam, image 7/series 2. Index nodule within the right lower lobe is unchanged measuring 6 mm, image 1/series 2. 5 mm nodule in the medial right lower lobe is stable, image 5/series 2. Past Medical History:  Diagnosis Date  . Allergy   . Elevated lipids   . Hyperlipidemia   . Hypertension   . Kidney stone   . Lumbar pars defect   . Pulmonary nodules    Past Surgical History:  Procedure Laterality Date  . HERNIA REPAIR    . SHOULDER SURGERY     Current Outpatient Prescriptions on File Prior to Visit  Medication Sig Dispense Refill  . benazepril (LOTENSIN) 20 MG tablet Take 1 tablet (20 mg total) by mouth daily. 90 tablet 3  . rosuvastatin (CRESTOR) 20 MG tablet Take 1 tablet (20  mg total) by mouth daily. 90 tablet 0   No current facility-administered medications on file prior to visit.    Allergies  Allergen Reactions  . Latex Hives   Social History   Social History  . Marital status: Married    Spouse name: N/A  . Number of children: N/A  . Years of education: N/A   Occupational History  . Not on file.   Social History Main Topics  . Smoking status: Never Smoker  . Smokeless tobacco: Never Used  . Alcohol use Yes     Comment: occ  . Drug use: No  . Sexual activity: Not on file     Comment: married, dairy farmer   Other Topics Concern  . Not on file   Social History Narrative  . No narrative on file      Review of Systems  All other systems reviewed and are negative.      Objective:   Physical Exam  Constitutional: He appears well-developed and well-nourished.  Cardiovascular: Normal rate, regular rhythm and normal heart sounds.   Pulmonary/Chest: Effort normal and breath sounds normal. No respiratory distress. He has no wheezes. He has no rales.  Musculoskeletal: He exhibits no edema, tenderness or deformity.       Right foot: Normal.       Left foot: Normal.  Vitals reviewed.         Assessment & Plan:  Neuropathy (HCC) - Plan: CBC with Differential/Platelet, COMPLETE METABOLIC PANEL WITH GFR, TSH, Vitamin B12  Pulmonary nodules - Plan: CT Chest W Contrast  I believe the pain in his feet is due to peripheral neuropathy. I will check a TSH, vitamin B12, and a blood sugar to evaluate for possible metabolic causes. If blood work is normal, consider gabapentin 300 mg 3 times a day for her pain. I will order a CT scan of the chest to follow-up of pulmonary nodules that he was found to have coincidentally in 2015 particular given his persistent left subscapular pain and pleurisy.

## 2015-09-21 LAB — COMPLETE METABOLIC PANEL WITH GFR
ALBUMIN: 4.5 g/dL (ref 3.6–5.1)
ALK PHOS: 44 U/L (ref 40–115)
ALT: 35 U/L (ref 9–46)
AST: 30 U/L (ref 10–35)
BUN: 17 mg/dL (ref 7–25)
CO2: 25 mmol/L (ref 20–31)
Calcium: 9.7 mg/dL (ref 8.6–10.3)
Chloride: 104 mmol/L (ref 98–110)
Creat: 1 mg/dL (ref 0.70–1.25)
GFR, EST NON AFRICAN AMERICAN: 79 mL/min (ref >=60)
GFR, Est African American: >89 mL/min (ref >=60)
GLUCOSE: 90 mg/dL (ref 70–99)
POTASSIUM: 4.8 mmol/L (ref 3.5–5.3)
SODIUM: 140 mmol/L (ref 135–146)
Total Bilirubin: 0.5 mg/dL (ref 0.2–1.2)
Total Protein: 7.6 g/dL (ref 6.1–8.1)

## 2015-10-08 ENCOUNTER — Ambulatory Visit
Admission: RE | Admit: 2015-10-08 | Discharge: 2015-10-08 | Disposition: A | Discharge status: Home / Self Care | Payer: BC Managed Care – PPO | Source: Ambulatory Visit | Attending: Family Medicine | Admitting: Family Medicine

## 2015-10-08 DIAGNOSIS — R918 Other nonspecific abnormal finding of lung field: Secondary | ICD-10-CM

## 2015-10-08 MED ORDER — IOPAMIDOL (ISOVUE-300) INJECTION 61%
75.0000 mL | Freq: Once | INTRAVENOUS | Status: AC | PRN
  Administered 2015-10-08: 75 mL via INTRAVENOUS

## 2015-10-11 ENCOUNTER — Encounter: Payer: Self-pay | Admitting: Family Medicine

## 2015-10-14 ENCOUNTER — Other Ambulatory Visit: Payer: Self-pay | Admitting: Family Medicine

## 2015-10-14 DIAGNOSIS — I712 Thoracic aortic aneurysm, without rupture, unspecified: Secondary | ICD-10-CM

## 2015-10-21 ENCOUNTER — Other Ambulatory Visit: Payer: Self-pay | Admitting: Family Medicine

## 2015-10-30 ENCOUNTER — Institutional Professional Consult (permissible substitution) (INDEPENDENT_AMBULATORY_CARE_PROVIDER_SITE_OTHER): Payer: BC Managed Care – PPO | Admitting: Surgery

## 2015-10-30 VITALS — BP 153/91 | HR 67 | Resp 20 | Ht 69.0 in | Wt 230.0 lb

## 2015-10-30 DIAGNOSIS — I712 Thoracic aortic aneurysm, without rupture, unspecified: Secondary | ICD-10-CM

## 2015-11-03 ENCOUNTER — Encounter: Payer: Self-pay | Admitting: Surgery

## 2015-11-03 NOTE — Progress Notes (Signed)
Cardiothoracic Surgery Consultation   PCP is Leo GrosserPICKARD,WARREN TOM, MD Referring Provider is Donita BrooksPickard, Warren T, MD  Chief Complaint  Patient presents with  . 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 He has a CT of the abdomen in 09/2012 and 06/2013 showing a left kidney stone as well as multiple small lung nodules in the lower lobes. They had not changed from one CT to the next. Then he developed severe knife-like pain below his left scapula about 6 months ago. The etiology of this was unclear and this resolved without any specific intervention. He had a follow up CT of the chest on 10/08/2015 which showed slight increase in size of the 6 mm nodule in the RUL and 7 mm nodule in the RLL. The remainder of the bilateral nodules were unchanged. There was also a 4.1 cm fusiform ascending aortic aneurysm.    Past Medical History:  Diagnosis Date  . Allergy   . Elevated lipids   . Hyperlipidemia   . Hypertension   . Kidney stone   . Lumbar pars defect   . Pulmonary nodules   . Thoracic aortic aneurysm without rupture [facility](HCC)     Past Surgical History:  Procedure Laterality Date  . HERNIA REPAIR    . SHOULDER SURGERY      Family History  Problem Relation Age of Onset  . Cancer Mother     stomach  . Heart disease Father   . Stroke Father     Social History Social History  Substance Use Topics  . Smoking status: Never Smoker  . Smokeless tobacco: Never Used  . Alcohol use Yes     Comment: occ    Current Outpatient Prescriptions  Medication Sig Dispense Refill  . benazepril (LOTENSIN) 20 MG tablet Take 1 tablet (20 mg total) by mouth daily. 90 tablet 3  . rosuvastatin (CRESTOR) 20 MG tablet TAKE ONE TABLET BY MOUTH ONCE DAILY 90 tablet 0   No current facility-administered  medications for this visit.     Allergies  Allergen Reactions  . Latex Hives    Review of Systems  Constitutional: Negative for activity change, appetite change, chills, fatigue, fever and unexpected weight change.  HENT: Negative.   Eyes: Negative.   Respiratory: Negative.   Cardiovascular: Negative.   Gastrointestinal: Negative.   Genitourinary:       Kidney stones  Musculoskeletal: Positive for back pain.  Skin: Negative.   Allergic/Immunologic: Negative.   Neurological:       Peripheral neuropathy  Hematological: Negative.   Psychiatric/Behavioral: Negative.     BP (!) 153/91   Pulse 67   Resp 20 Comment: RA  Ht 5\' 9"  (1.753 m)   Wt 230 lb (104.3 kg)   SpO2 96% Comment: RA  BMI 33.97 kg/m  Physical Exam  Constitutional: He is oriented to person, place, and time. He appears well-developed and well-nourished. No distress.  HENT:  Head: Normocephalic and atraumatic.  Mouth/Throat: Oropharynx is clear and moist.  Eyes: EOM are normal. Pupils are equal, round, and reactive to light.  Neck: Normal range of motion. Neck supple. No thyromegaly present.  Cardiovascular: Normal rate, regular rhythm, normal heart sounds and intact distal pulses.   No murmur heard. Pulmonary/Chest: Effort normal and  breath sounds normal. No respiratory distress. He has no rales. He exhibits no tenderness.  Abdominal: Soft. Bowel sounds are normal. He exhibits no distension. There is no tenderness.  Musculoskeletal: Normal range of motion. He exhibits no edema.  Lymphadenopathy:    He has no cervical adenopathy.  Neurological: He is alert and oriented to person, place, and time. He has normal strength. No cranial nerve deficit or sensory deficit.  Skin: Skin is warm and dry.  Psychiatric: He has a normal mood and affect.    Diagnostic Tests:  CLINICAL DATA:  Pulmonary nodules seen on CT of 09/29/2012, nonsmoker, no history of cancer.  EXAM: CT CHEST WITH  CONTRAST  TECHNIQUE: Multidetector CT imaging of the chest was performed during intravenous contrast administration.  CONTRAST:  75mL ISOVUE-300 IOPAMIDOL (ISOVUE-300) INJECTION 61%  COMPARISON:  09/29/2012 CT abdomen  FINDINGS: Cardiovascular: Cardiac chambers are top-normal in size with coronary arteriosclerosis. Ascending thoracic aortic aneurysm at 4.1 cm in diameter at the level of main pulmonary artery. No dissection. No descending thoracic aortic aneurysm. No significant pericardial effusion.  Mediastinum/Nodes: Small bilateral axillary lymph nodes containing hilar fat. Of pathologic enlargement. No mediastinal nor hilar adenopathy. Small prevascular fat containing lymph nodes are noted as well as right lower paratracheal lymph nodes. No hilar adenopathy.  Lungs/Pleura: Noncalcified predominant subpleural pulmonary nodules are noted bilaterally.  The following nodules are noted:  The largest is in the right upper lobe adjacent to the minor fissure measuring 8 x 4 mm, mean 6 mm, series 4, image 64 which appears larger than on prior series 3, image 2 where it measured 4 mm.  Right middle lobe 3 mm lateral segment nodule series 4, image 30.  Lingular 5 mm subpleural nodule series 4, image 78 and pleural-based 3 mm nodule series 4, image 80.  Right lower lobe 9 x 4 mm, mean 7 mm subpleural nodule series 4, image 66 is stable in appearance than on prior exam estimated at 9 mm previously.  Right lower lobe pleural based 6 and 5 mm nodule series 4, image 76.  Left lower lobe 5 mm nodule series 4, image 76. Three additional pleural based left lower lobe nodules ranging in size 4 mm to 5 mm seen on series 4, image 81 and 87.  Right lower lobe 6 mm nodule appears slightly more prominent than on prior series 3, image 20 where measured 5 mm in diameter.  No effusion or pneumothorax.  No pulmonary consolidation.  Upper Abdomen: Bilateral gynecomastia. No  adrenal mass. No space-occupying lesion of the visualized liver, pancreas, and spleen. No gallstones. Normal bowel rotation.  Musculoskeletal: No lytic or blastic abnormality.  IMPRESSION: Bilateral noncalcified predominantly subpleural pulmonary nodules, the largest with mean diameter of 6 mm in the right upper and 7 mm in the right lower lobe. As there has been some interval increase in size of some of these nodules in the right upper and right lower lobes since prior exam, follow up in 3-6 months per 2017 Fleischner Society recommendations for multiple pulmonary nodules is recommended.   Electronically Signed   By: Tollie Eth M.D.   On: 10/08/2015 17:03  Impression:  I have personally reviewed and interpreted his recent chest CT as well as his prior abdominal CT scans. This gentleman has a 4.1 cm fusiform ascending aortic aneurysm that is well below the 5.5 cm threshold for recommending surgical treatment. This will need to be followed with CT yearly for now to establish stability. I discussed the importance  of good blood pressure control with him and his wife. There has been slight interval change in a small nodule in the RUL and RLL and this will require a follow up CT of the chest in 6 months. Given his occupational exposure to multiple inhaled organisms and the bilateral diffuse nature of these small nodules I suspect that they are benign. I reviewed the CT scan with him and his wife and answered their questions.  Plan:  I will see him back in 6 months with a CT scan of the chest without contrast.  I spent 60 minutes performing this consultation and > 50% of this time was spent face to face counseling and coordinating the care of this patient's ascending aortic aneurysm and lung nodules. Alleen BorneBryan K Bartle, MD [facility] [phone]

## 2016-01-29 ENCOUNTER — Other Ambulatory Visit: Payer: Self-pay | Admitting: Family Medicine

## 2016-01-29 NOTE — Telephone Encounter (Signed)
Medication refilled per protocol.Patient needs to be seen before any further refills 

## 2016-03-12 ENCOUNTER — Other Ambulatory Visit: Payer: Self-pay | Admitting: *Deleted

## 2016-03-12 DIAGNOSIS — I712 Thoracic aortic aneurysm, without rupture, unspecified: Secondary | ICD-10-CM

## 2016-03-12 DIAGNOSIS — R918 Other nonspecific abnormal finding of lung field: Secondary | ICD-10-CM

## 2016-03-24 ENCOUNTER — Other Ambulatory Visit: Payer: Self-pay | Admitting: Family Medicine

## 2016-04-29 ENCOUNTER — Other Ambulatory Visit: Payer: BC Managed Care – PPO

## 2016-04-29 ENCOUNTER — Encounter: Payer: BC Managed Care – PPO | Admitting: Surgery

## 2016-05-05 ENCOUNTER — Other Ambulatory Visit: Payer: Self-pay | Admitting: Family Medicine

## 2016-05-06 ENCOUNTER — Encounter: Payer: Self-pay | Admitting: Surgery

## 2016-05-06 ENCOUNTER — Ambulatory Visit (INDEPENDENT_AMBULATORY_CARE_PROVIDER_SITE_OTHER): Payer: Medicare Other | Admitting: Surgery

## 2016-05-06 ENCOUNTER — Ambulatory Visit
Admission: RE | Admit: 2016-05-06 | Discharge: 2016-05-06 | Disposition: A | Discharge status: Home / Self Care | Payer: Medicare Other | Source: Ambulatory Visit | Attending: Surgery | Admitting: Surgery

## 2016-05-06 VITALS — BP 137/80 | HR 74 | Resp 20 | Ht 69.0 in | Wt 230.0 lb

## 2016-05-06 DIAGNOSIS — I712 Thoracic aortic aneurysm, without rupture, unspecified: Secondary | ICD-10-CM

## 2016-05-06 DIAGNOSIS — R918 Other nonspecific abnormal finding of lung field: Secondary | ICD-10-CM

## 2016-05-07 ENCOUNTER — Encounter: Payer: Self-pay | Admitting: Surgery

## 2016-05-07 NOTE — Progress Notes (Signed)
HPI:  The patient returns today for follow up of a 4.1 cm fusiform ascending aortic aneurysm and multiple bilateral pulmonary nodules. 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 He has not had any chest or back pain. He denies cough and sputum production.  Current Outpatient Prescriptions  Medication Sig Dispense Refill  . benazepril (LOTENSIN) 20 MG tablet TAKE ONE TABLET BY MOUTH ONCE DAILY 90 tablet 3  . rosuvastatin (CRESTOR) 20 MG tablet TAKE ONE TABLET BY MOUTH ONCE DAILY 90 tablet 0   No current facility-administered medications for this visit.      Physical Exam: BP 137/80   Pulse 74   Resp 20   Ht 5\' 9"  (1.753 m)   Wt 230 lb (104.3 kg)   SpO2 97% Comment: RA  BMI 33.97 kg/m  He looks well There is no cervical or supraclavicular adenopathy Lungs are clear Cardiac exam shows a regular rate and rhythm with normal heart sounds.   Diagnostic Tests:  CLINICAL DATA:  Follow-up thoracic aortic aneurysm and pulmonary nodules.  EXAM: CT CHEST WITHOUT CONTRAST  TECHNIQUE: Multidetector CT imaging of the chest was performed following the standard protocol without IV contrast.  COMPARISON:  10/08/2015 chest CT.  FINDINGS: Cardiovascular: Normal heart size. No significant pericardial fluid/thickening. Left main, left anterior descending, left circumflex and right coronary atherosclerosis. Thoracic aortic atherosclerosis. Ectatic ascending thoracic aorta, maximum diameter 4.0 cm, stable since 10/08/2015 using similar measurement technique. Stable top-normal caliber main pulmonary artery (3.1 cm diameter).  Mediastinum/Nodes: No discrete thyroid nodules. Unremarkable esophagus. No pathologically enlarged axillary, mediastinal or gross hilar lymph nodes, noting limited sensitivity for the detection of hilar adenopathy on this noncontrast study.  Lungs/Pleura: No pneumothorax. No pleural effusion. There are numerous (at  least 20) solid pulmonary nodules scattered throughout both lungs involving all lung lobes, with representative nodules including a 8 mm anterior right middle lobe nodule (series 4/ image 64), 8 mm anterior right lower lobe nodule (series 4/ image 69) and 4 mm left lower lobe nodule (series 4/ image 83), all unchanged since 10/08/2015 using similar measurement technique. No acute consolidative airspace disease, lung masses or new significant pulmonary nodules.  Upper abdomen: Unremarkable.  Musculoskeletal: No aggressive appearing focal osseous lesions. Mild thoracic spondylosis.  IMPRESSION: 1. Stable ectatic 4.0 cm ascending thoracic aorta. Recommend annual imaging followup by CTA or MRA. This recommendation follows 2010 ACCF/AHA/AATS/ACR/ASA/SCA/SCAI/SIR/STS/SVM Guidelines for the Diagnosis and Management of Patients with Thoracic Aortic Disease. Circulation. 2010; 121: Z610-R604. 2. No appreciable change in numerous solid pulmonary nodules scattered throughout both lungs, measuring up to 8 mm, for which 6 month stability has been demonstrated, suggesting benign nodules. Recommend a follow-up chest CT in 12-18 months. This recommendation follows the consensus statement: Guidelines for Management of Incidental Pulmonary Nodules Detected on CT Images: From the Fleischner Society 2017; Radiology 2017; 284:228-243. 3. Aortic atherosclerosis. Left main and 3 vessel coronary atherosclerosis.   Electronically Signed   By: Delbert Phenix M.D.   On: 05/06/2016 14:00   Impression:  He has a stable 4.0 cm fusiform ascending aortic aneurysm. This should be followed up in a year. His BP is under adequate control and I stressed the importance of that for preventing further enlargement and aortic dissection. The multiple bilateral small pulmonary nodules are unchanged with the largest measuring 8 mm. I think these are probably benign since they are multiple, bilateral and unchanged.  They can be followed up in one year.  Plan:  He will return in one year with a CT of the chest. His aorta shows up well so I think it can be done without contrast.   Alleen BorneBryan K Bartle, MD [facility] [phone]

## 2016-05-21 ENCOUNTER — Ambulatory Visit (INDEPENDENT_AMBULATORY_CARE_PROVIDER_SITE_OTHER): Payer: Medicare Other | Admitting: Physician Assistant

## 2016-05-21 ENCOUNTER — Encounter: Payer: Self-pay | Admitting: Physician Assistant

## 2016-05-21 VITALS — BP 160/90 | HR 74 | Temp 98.1°F | Resp 18 | Wt 230.4 lb

## 2016-05-21 DIAGNOSIS — J988 Other specified respiratory disorders: Secondary | ICD-10-CM | POA: Diagnosis not present

## 2016-05-21 DIAGNOSIS — B9689 Other specified bacterial agents as the cause of diseases classified elsewhere: Principal | ICD-10-CM

## 2016-05-21 MED ORDER — AZITHROMYCIN 250 MG PO TABS: ORAL_TABLET | ORAL | 0 refills | Status: DC

## 2016-05-21 NOTE — Progress Notes (Signed)
    Patient ID: Shane Robinson MRN: [id number], DOB: December 28, 1950, 66 y.o. Date of Encounter: 05/21/2016, 2:36 PM    Chief Complaint:  Chief Complaint  Patient presents with  . sinus problems     HPI: 66 y.o. year old male presents with above.   Says that about 2-1/2 weeks ago he developed a "head cold". With runny nose and sneezing and nasal congestion. Now it is settled in his throat. Says that this is what happens -- in the past--start this head cold then settles in his throat and will just continue to linger they are until he takes antibiotics. Had no real chest congestion. No fevers or chills.     Home Meds:   Outpatient Medications Prior to Visit  Medication Sig Dispense Refill  . benazepril (LOTENSIN) 20 MG tablet TAKE ONE TABLET BY MOUTH ONCE DAILY 90 tablet 3  . rosuvastatin (CRESTOR) 20 MG tablet TAKE ONE TABLET BY MOUTH ONCE DAILY 90 tablet 0   No facility-administered medications prior to visit.     Allergies:  Allergies  Allergen Reactions  . Latex Hives      Review of Systems: See HPI for pertinent ROS. All other ROS negative.    Physical Exam: Blood pressure (!) 160/90, pulse 74, temperature 98.1 F (36.7 C), temperature source Oral, resp. rate 18, weight 230 lb 6.4 oz (104.5 kg), SpO2 96 %., Body mass index is 34.02 kg/m. General:  WNWD WM. Appears in no acute distress. HEENT: Normocephalic, atraumatic, eyes without discharge, sclera non-icteric, nares are without discharge. Bilateral auditory canals clear, TM's are without perforation, pearly grey and translucent with reflective cone of light bilaterally. Oral cavity moist, posterior pharynx without exudate, erythema, peritonsillar abscess.  Neck: Supple. No thyromegaly. No lymphadenopathy. Lungs: Clear bilaterally to auscultation without wheezes, rales, or rhonchi. Breathing is unlabored. Heart: Regular rhythm. No murmurs, rubs, or gallops. Msk:  Strength and tone normal for age. Extremities/Skin: Warm  and dry.  Neuro: Alert and oriented X 3. Moves all extremities spontaneously. Gait is normal. CNII-XII grossly in tact. Psych:  Responds to questions appropriately with a normal affect.     ASSESSMENT AND PLAN:  66 y.o. year old male with  1. Bacterial respiratory infection He is to take antibiotic as directed. Follow-up if symptoms do not resolve within 1 week after completion of antibiotic. - azithromycin (ZITHROMAX) 250 MG tablet; Day 1: Take 2 daily. Days 2 -5: Take 1 daily.  Dispense: 6 tablet; Refill: 0   Signed, [id number]Mary Beth VansantDixon, [place], [facility] 05/21/2016 2:36 PM

## 2016-06-10 ENCOUNTER — Ambulatory Visit: Payer: Medicare Other | Admitting: Family Medicine

## 2016-06-10 ENCOUNTER — Encounter: Payer: Self-pay | Admitting: Family Medicine

## 2016-06-10 ENCOUNTER — Ambulatory Visit (INDEPENDENT_AMBULATORY_CARE_PROVIDER_SITE_OTHER): Payer: Medicare Other | Admitting: Family Medicine

## 2016-06-10 VITALS — BP 138/80 | HR 56 | Temp 98.0°F | Resp 12 | Ht 69.0 in | Wt 227.0 lb

## 2016-06-10 DIAGNOSIS — S40261A Insect bite (nonvenomous) of right shoulder, initial encounter: Secondary | ICD-10-CM | POA: Diagnosis not present

## 2016-06-10 DIAGNOSIS — M4306 Spondylolysis, lumbar region: Secondary | ICD-10-CM | POA: Diagnosis not present

## 2016-06-10 DIAGNOSIS — W57XXXA Bitten or stung by nonvenomous insect and other nonvenomous arthropods, initial encounter: Secondary | ICD-10-CM

## 2016-06-10 MED ORDER — DOXYCYCLINE HYCLATE 100 MG PO TABS: 100.0000 mg | ORAL_TABLET | Freq: Two times a day (BID) | ORAL | 0 refills | Status: DC

## 2016-06-10 NOTE — Progress Notes (Signed)
   Subjective:    Patient ID: Shane Robinson, male    DOB: 1950/07/05, 66 y.o.   MRN: [id number]  Patient presents for Tick Bite (x2 days- pulled tick off R Shoulder on Monday night- has had fatigue, rash to lower back)   Tick removed Monday from Right shoulder, it was engorged, unknown how long it had been there,. He runs a dairy farm. Monday he also noticed red rash to lower back, with some itching, he hhas also been more fatigued than normal, denies any fever or joint pain   Chronic back pain, has pain mostly in left side, gets a burning sensation on left foot .  was treated by chiropracter in past thinks he may have had xrays year ago. Marland Kitchen.  Has never had MRI imaging no physical therapy .Bilateral pars defect anteriolisthesis L5/S1 noted on CT abd/pelvis in 2015     Chronic knee pain- has had injections/flexgenic right knee, needs knee replacement     Review Of Systems:  GEN- + fatigue, fever, weight loss,weakness, recent illness HEENT- denies eye drainage, change in vision, nasal discharge, CVS- denies chest pain, palpitations RESP- denies SOB, cough, wheeze ABD- denies N/V, change in stools, abd pain GU- denies dysuria, hematuria, dribbling, incontinence MSK- + joint pain, muscle aches, injury Neuro- denies headache, dizziness, syncope, seizure activity       Objective:    BP 138/80   Pulse (!) 56   Temp 98 F (36.7 C) (Oral)   Resp 12   Ht 5\' 9"  (1.753 m)   Wt 227 lb (103 kg)   SpO2 98%   BMI 33.52 kg/m  GEN- NAD, alert and oriented x3 HEENT- PERRL, EOMI, non injected sclera, pink conjunctiva, MMM, oropharynx clear Neck- Supple, no thyromegaly, no LAD  CVS- RRR, no murmur RESP-CTAB Skin- tick bite mild erythema at bite with scab, lower back, faint erythematous macular rash scattered across back, + few excoriations MSK- Spine NT, neg SLR, fair ROM, fair ROM HIPS/KNEES  Neuro- normal tone LE, decreased DTR left compared to right, sensation grosslyy in tact with  monofilament in foot  EXT- No edema Pulses- Radial, DP- 2+        Assessment & Plan:      Problem List Items Addressed This Visit    Lumbar pars defect    Discussed his chronic pain, likley has some nerve impingment, but he does not want to pursue at this time He will call us when he wants this evaluated Would recommend MRI spine due to anterolisthesis and pars defect ,intermittant  radicular symptoms       Other Visit Diagnoses    Tick bite, initial encounter    -  Primary   Doxycycline ot be started, with rash and recent tick bite       Note: This dictation was prepared with Dragon dictation along with smaller phrase technology. Any transcriptional errors that result from this process are unintentional.

## 2016-06-10 NOTE — Patient Instructions (Signed)
Take antibiotics Call about back when you want it evaluated F/U as needed

## 2016-06-10 NOTE — Assessment & Plan Note (Signed)
Discussed his chronic pain, likley has some nerve impingment, but he does not want to pursue at this time He will call us when he wants this evaluated Would recommend MRI spine due to anterolisthesis and pars defect ,intermittant  radicular symptoms

## 2016-08-04 ENCOUNTER — Other Ambulatory Visit: Payer: Self-pay | Admitting: Family Medicine

## 2016-10-22 ENCOUNTER — Ambulatory Visit (INDEPENDENT_AMBULATORY_CARE_PROVIDER_SITE_OTHER): Payer: Medicare Other | Admitting: *Deleted

## 2016-10-22 DIAGNOSIS — S61219A Laceration without foreign body of unspecified finger without damage to nail, initial encounter: Secondary | ICD-10-CM

## 2016-10-22 DIAGNOSIS — Z23 Encounter for immunization: Secondary | ICD-10-CM | POA: Diagnosis not present

## 2016-10-22 NOTE — Progress Notes (Signed)
Patient works as Engineer, maintenancedairy farmer and noted to have injury to hand.   Requested Tdap and Influenza Vaccination.   Tolerated IM administration well.   Immunization history updated.

## 2016-11-10 ENCOUNTER — Telehealth: Payer: Self-pay | Admitting: Family Medicine

## 2016-11-10 MED ORDER — ROSUVASTATIN CALCIUM 20 MG PO TABS: 20.0000 mg | ORAL_TABLET | Freq: Every day | ORAL | 0 refills | Status: DC

## 2016-11-10 MED ORDER — BENAZEPRIL HCL 20 MG PO TABS: 20.0000 mg | ORAL_TABLET | Freq: Every day | ORAL | 0 refills | Status: DC

## 2016-11-10 NOTE — Telephone Encounter (Signed)
Medication called/sent to requested pharmacy and pt aware via vm 

## 2016-11-10 NOTE — Addendum Note (Signed)
Addended by: Legrand RamsWILLIS, Shogo Larkey B on: 11/10/2016 12:47 PM   Modules accepted: Orders

## 2016-11-10 NOTE — Telephone Encounter (Signed)
Patient has upcoming appointment with dr Tanya Nonespickard, for med refills, would like to know if his water pill can be refilled before he has his scheduled appointment

## 2016-11-10 NOTE — Telephone Encounter (Signed)
Pt called back and LMOVM stating he need Rosuvastatin not benazepril. Med sent to pharm.

## 2016-11-12 ENCOUNTER — Ambulatory Visit: Payer: Medicare Other | Admitting: Family Medicine

## 2016-11-12 ENCOUNTER — Encounter: Payer: Self-pay | Admitting: Family Medicine

## 2016-11-12 VITALS — BP 126/70 | HR 78 | Temp 97.8°F | Resp 16 | Ht 69.0 in | Wt 228.0 lb

## 2016-11-12 DIAGNOSIS — E7800 Pure hypercholesterolemia, unspecified: Secondary | ICD-10-CM

## 2016-11-12 DIAGNOSIS — Z125 Encounter for screening for malignant neoplasm of prostate: Secondary | ICD-10-CM | POA: Diagnosis not present

## 2016-11-12 DIAGNOSIS — Z1159 Encounter for screening for other viral diseases: Secondary | ICD-10-CM | POA: Diagnosis not present

## 2016-11-12 NOTE — Progress Notes (Signed)
Subjective:    Patient ID: Shane Robinson, male    DOB: 02/10/1950, 66 y.o.   MRN: [id number]  HPI Since I last saw the patient, he was diagnosed with a thoracic aortic aneurysm is 4 cm.  He is being followed annually by [facility].  He is currently taking benazepril for hypertension.  His blood pressure is well controlled at 126/70.  He denies any chest pain shortness of breath or dyspnea on exertion.  He is also taking rosuvastatin for hyperlipidemia.  He denies any myalgias or right upper quadrant pain.  He is overdue for fasting lab work as well as lab work to screen for hepatitis C and prostate cancer with a PSA. Past Medical History:  Diagnosis Date  . Allergy   . Elevated lipids   . Hyperlipidemia   . Hypertension   . Kidney stone   . Lumbar pars defect   . Pulmonary nodules   . Thoracic aortic aneurysm without rupture [facility](HCC)    Past Surgical History:  Procedure Laterality Date  . HERNIA REPAIR    . SHOULDER SURGERY     Current Outpatient Medications on File Prior to Visit  Medication Sig Dispense Refill  . benazepril (LOTENSIN) 20 MG tablet Take 1 tablet (20 mg total) daily by mouth. 90 tablet 0  . rosuvastatin (CRESTOR) 20 MG tablet Take 1 tablet (20 mg total) daily by mouth. 90 tablet 0   No current facility-administered medications on file prior to visit.    Allergies  Allergen Reactions  . Latex Hives   Social History   Socioeconomic History  . Marital status: Married    Spouse name: Not on file  . Number of children: Not on file  . Years of education: Not on file  . Highest education level: Not on file  Social Needs  . Financial resource strain: Not on file  . Food insecurity - worry: Not on file  . Food insecurity - inability: Not on file  . Transportation needs - medical: Not on file  . Transportation needs - non-medical: Not on file  Occupational History  . Not on file  Tobacco Use  . Smoking status: Never Smoker  . Smokeless tobacco: Never Used    Substance and Sexual Activity  . Alcohol use: Yes    Comment: occ  . Drug use: No  . Sexual activity: Not on file    Comment: married, dairy farmer  Other Topics Concern  . Not on file  Social History Narrative  . Not on file      Review of Systems  All other systems reviewed and are negative.      Objective:   Physical Exam  Cardiovascular: Normal rate, regular rhythm and normal heart sounds.  Pulmonary/Chest: Effort normal and breath sounds normal. No respiratory distress. He has no wheezes. He has no rales.  Abdominal: Soft. Bowel sounds are normal. He exhibits no distension. There is no tenderness. There is no rebound.  Musculoskeletal: He exhibits no edema.  Neurological: He is alert. He has normal reflexes. He exhibits normal muscle tone.  Vitals reviewed.         Assessment & Plan:  Pure hypercholesterolemia - Plan: COMPLETE METABOLIC PANEL WITH GFR, Lipid panel, CBC with Differential/Platelet  Encounter for hepatitis C screening test for low risk patient - Plan: Hepatitis C Antibody  Patient's blood pressure today is acceptable.  I have asked him to return fasting tomorrow so that I can check fasting lipid panel.  His  goal LDL cholesterol is less than 130.  I will also screen the patient for hepatitis C and while checking lab work I will screen him for prostate cancer with a PSA.  Patient is due for a complete physical exam.

## 2016-11-13 ENCOUNTER — Other Ambulatory Visit: Payer: Medicare Other

## 2016-11-14 LAB — COMPLETE METABOLIC PANEL WITH GFR
AG RATIO: 1.3 (calc) (ref 1.0–2.5)
ALT: 28 U/L (ref 9–46)
AST: 26 U/L (ref 10–35)
Albumin: 4.1 g/dL (ref 3.6–5.1)
Alkaline phosphatase (APISO): 44 U/L (ref 40–115)
BUN: 16 mg/dL (ref 7–25)
CALCIUM: 8.8 mg/dL (ref 8.6–10.3)
CHLORIDE: 104 mmol/L (ref 98–110)
CO2: 25 mmol/L (ref 20–32)
Creat: 1.18 mg/dL (ref 0.70–1.25)
GFR, Est African American: 75 mL/min/{1.73_m2} (ref >=60)
GFR, Est Non African American: 64 mL/min/{1.73_m2} (ref >=60)
Globulin: 3.1 g/dL (calc) (ref 1.9–3.7)
Glucose, Bld: 89 mg/dL (ref 65–99)
POTASSIUM: 4.2 mmol/L (ref 3.5–5.3)
Sodium: 141 mmol/L (ref 135–146)
Total Bilirubin: 0.5 mg/dL (ref 0.2–1.2)
Total Protein: 7.2 g/dL (ref 6.1–8.1)

## 2016-11-14 LAB — CBC WITH DIFFERENTIAL/PLATELET
BASOS ABS: 44 {cells}/uL (ref 0–200)
Basophils Relative: 0.6 %
Eosinophils Absolute: 183 cells/uL (ref 15–500)
Eosinophils Relative: 2.5 %
HEMATOCRIT: 41.7 % (ref 38.5–50.0)
Hemoglobin: 14.1 g/dL (ref 13.2–17.1)
LYMPHS ABS: 2781 {cells}/uL (ref 850–3900)
MCH: 30.9 pg (ref 27.0–33.0)
MCHC: 33.8 g/dL (ref 32.0–36.0)
MCV: 91.2 fL (ref 80.0–100.0)
MPV: 11.7 fL (ref 7.5–12.5)
Monocytes Relative: 6.5 %
NEUTROS PCT: 52.3 %
Neutro Abs: 3818 cells/uL (ref 1500–7800)
Platelets: 199 10*3/uL (ref 140–400)
RBC: 4.57 10*6/uL (ref 4.20–5.80)
RDW: 12 % (ref 11.0–15.0)
Total Lymphocyte: 38.1 %
WBC: 7.3 10*3/uL (ref 3.8–10.8)
WBCMIX: 475 {cells}/uL (ref 200–950)

## 2016-11-14 LAB — HEPATITIS C ANTIBODY
Hepatitis C Ab: NONREACTIVE
SIGNAL TO CUT-OFF: 0.01 (ref <1.00)

## 2016-11-14 LAB — LIPID PANEL
Cholesterol: 106 mg/dL (ref <200)
HDL: 38 mg/dL — AB (ref >40)
LDL Cholesterol (Calc): 52 mg/dL (calc)
Non-HDL Cholesterol (Calc): 68 mg/dL (calc) (ref <130)
TRIGLYCERIDES: 78 mg/dL (ref <150)
Total CHOL/HDL Ratio: 2.8 (calc) (ref <5.0)

## 2016-11-14 LAB — PSA: PSA: 0.5 ng/mL (ref <=4.0)

## 2016-11-16 ENCOUNTER — Encounter: Payer: Self-pay | Admitting: Family Medicine

## 2017-02-19 ENCOUNTER — Other Ambulatory Visit: Payer: Self-pay | Admitting: Family Medicine

## 2017-02-22 ENCOUNTER — Encounter (INDEPENDENT_AMBULATORY_CARE_PROVIDER_SITE_OTHER): Payer: Self-pay | Admitting: Orthopedic Surgery

## 2017-02-22 ENCOUNTER — Ambulatory Visit (INDEPENDENT_AMBULATORY_CARE_PROVIDER_SITE_OTHER): Payer: Self-pay

## 2017-02-22 ENCOUNTER — Ambulatory Visit (INDEPENDENT_AMBULATORY_CARE_PROVIDER_SITE_OTHER): Payer: Medicare Other | Admitting: Orthopedic Surgery

## 2017-02-22 DIAGNOSIS — M25561 Pain in right knee: Secondary | ICD-10-CM

## 2017-02-22 DIAGNOSIS — G8929 Other chronic pain: Secondary | ICD-10-CM

## 2017-02-22 DIAGNOSIS — M1711 Unilateral primary osteoarthritis, right knee: Secondary | ICD-10-CM

## 2017-02-24 ENCOUNTER — Encounter (INDEPENDENT_AMBULATORY_CARE_PROVIDER_SITE_OTHER): Payer: Self-pay | Admitting: Orthopedic Surgery

## 2017-02-24 NOTE — Progress Notes (Signed)
Office Visit Note   Patient: Shane Robinson           Date of Birth: 12-17-1950           MRN: [id number] Visit Date: 02/22/2017 Requested by: Donita Brooks, MD [address] [place], [place] [address] PCP: Donita Brooks, MD  Subjective: Chief Complaint  Patient presents with  . Right Knee - Pain    HPI: Daemian is a patient with long history of right knee pain worse over the last 2 years.  He is a Engineer, maintenance.  States that the pain is constant.  He has had multiple injections.  He can walk but it is painful.  Reports a burning pain in the knee on the lateral and medial and he has had cortisone injection and gel injection.  Denies any groin pain.  He tried stem cells but it was a waste of money.  This is according to his report.  Takes ibuprofen as needed.  Had previous arthroscopy in 2016 which did not help him very much.              ROS: All systems reviewed are negative as they relate to the chief complaint within the history of present illness.  Patient denies  fevers or chills.   Assessment & Plan: Visit Diagnoses:  1. Chronic pain of right knee   2. Unilateral primary osteoarthritis, right knee     Plan: Impression is end-stage right knee arthritis.  We talked a lot about operative and nonoperative options at this time.  Risk and benefits of surgical intervention discussed including but not limited to infection nerve vessel damage knee stiffness and incomplete pain relief.  I think in general he is a pretty good candidate for knee replacement.  May be a candidate for press-fit.  The patient understands risks and benefits and wishes to proceed.  All questions answered  Follow-Up Instructions: No Follow-up on file.   Orders:  Orders Placed This Encounter  Procedures  . XR KNEE 3 VIEW RIGHT   No orders of the defined types were placed in this encounter.     Procedures: No procedures performed   Clinical Data: No additional findings.  Objective: Vital  Signs: There were no vitals taken for this visit.  Physical Exam:   Constitutional: Patient appears well-developed HEENT:  Head: Normocephalic Eyes:EOM are normal Neck: Normal range of motion Cardiovascular: Normal rate Pulmonary/chest: Effort normal Neurologic: Patient is alert Skin: Skin is warm Psychiatric: Patient has normal mood and affect    Ortho Exam: Orthopedic exam demonstrates palpable pedal pulses.  Range of motion on the right knee is just to 90 degrees.  Extensor mechanism is intact.  Collateral and cruciate ligaments are stable.  There is medial and lateral joint space line tenderness.  No groin pain with internal/external rotation of the leg.  No other masses lymph adenopathy or skin changes noted in the right knee region.  Specialty Comments:  No specialty comments available.  Imaging: No results found.   PMFS History: Patient Active Problem List   Diagnosis Date Noted  . Pulmonary nodules   . Lumbar pars defect   . HYPERLIPIDEMIA TYPE I / IV 03/14/2009  . HYPERLIPIDEMIA-MIXED 03/14/2009  . HYPERTENSION, UNSPECIFIED 03/14/2009   Past Medical History:  Diagnosis Date  . Allergy   . Elevated lipids   . Hyperlipidemia   . Hypertension   . Kidney stone   . Lumbar pars defect   . Pulmonary  nodules   . Thoracic aortic aneurysm without rupture (HCC)     Family History  Problem Relation Age of Onset  . Cancer Mother        stomach  . Heart disease Father   . Stroke Father     Past Surgical History:  Procedure Laterality Date  . HERNIA REPAIR    . SHOULDER SURGERY     Social History   Occupational History  . Not on file  Tobacco Use  . Smoking status: Never Smoker  . Smokeless tobacco: Never Used  Substance and Sexual Activity  . Alcohol use: Yes    Comment: occ  . Drug use: No  . Sexual activity: Not on file    Comment: married, dairy farmer

## 2017-04-13 DIAGNOSIS — M25562 Pain in left knee: Secondary | ICD-10-CM | POA: Insufficient documentation

## 2017-04-14 DIAGNOSIS — M4310 Spondylolisthesis, site unspecified: Secondary | ICD-10-CM | POA: Insufficient documentation

## 2017-04-14 DIAGNOSIS — I8390 Asymptomatic varicose veins of unspecified lower extremity: Secondary | ICD-10-CM | POA: Insufficient documentation

## 2017-04-27 ENCOUNTER — Other Ambulatory Visit: Payer: Self-pay | Admitting: Surgery

## 2017-04-27 DIAGNOSIS — R911 Solitary pulmonary nodule: Secondary | ICD-10-CM

## 2017-05-26 ENCOUNTER — Encounter: Payer: Self-pay | Admitting: Surgery

## 2017-05-26 ENCOUNTER — Ambulatory Visit: Payer: Medicare Other | Admitting: Surgery

## 2017-05-26 ENCOUNTER — Other Ambulatory Visit: Payer: Self-pay

## 2017-05-26 ENCOUNTER — Ambulatory Visit
Admission: RE | Admit: 2017-05-26 | Discharge: 2017-05-26 | Disposition: A | Discharge status: Home / Self Care | Payer: Medicare Other | Source: Ambulatory Visit | Attending: Surgery | Admitting: Surgery

## 2017-05-26 VITALS — BP 127/81 | HR 61 | Resp 18 | Ht 69.0 in | Wt 225.8 lb

## 2017-05-26 DIAGNOSIS — R911 Solitary pulmonary nodule: Secondary | ICD-10-CM

## 2017-05-26 DIAGNOSIS — I712 Thoracic aortic aneurysm, without rupture, unspecified: Secondary | ICD-10-CM

## 2017-05-26 NOTE — Progress Notes (Signed)
HPI:  The patient returns today for follow up of a 4.1 cm fusiform ascending aortic aneurysm and multiple bilateral pulmonary nodules. 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  He has never had an episode like that before and it resolved after he stopped.  He denies any exertional fatigue, shortness of breath, or chest pain.  He denies cough and sputum production.    Current Outpatient Medications  Medication Sig Dispense Refill  . benazepril (LOTENSIN) 20 MG tablet Take 1 tablet (20 mg total) daily by mouth. 90 tablet 0  . rosuvastatin (CRESTOR) 20 MG tablet TAKE 1 TABLET BY MOUTH DAILY 90 tablet 2   No current facility-administered medications for this visit.      Physical Exam: BP 127/81 (BP Location: Right Arm, Patient Position: Sitting, Cuff Size: Normal)   Pulse 61   Resp 18   Ht  (1.753 m)   Wt 225 lb 12.8 oz (102.4 kg)   SpO2 98% Comment: RA  BMI 33.34 kg/m  He looks well. Cardiac exam shows a regular rate and rhythm with normal heart sounds.  There is no murmur. Lung exam is clear.  Diagnostic Tests:  CLINICAL DATA:  Lung nodule follow-up.  EXAM: CT CHEST WITHOUT CONTRAST  TECHNIQUE: Multidetector CT imaging of the chest was performed following the standard protocol without IV contrast.  COMPARISON:  CT chest dated May 06, 2016.  FINDINGS: Cardiovascular: Normal heart size. No pericardial effusion. Stable borderline aneurysmal dilatation of the ascending thoracic aorta, measuring 4.0 cm, unchanged since October 2017. Coronary, aortic arch, and branch vessel atherosclerotic vascular disease.  Mediastinum/Nodes: No enlarged mediastinal or axillary lymph nodes. Thyroid gland, trachea, and esophagus demonstrate no  significant findings.  Lungs/Pleura: Numerous solid pulmonary nodules throughout both lungs, measuring up to 8 mm, all unchanged since October 2017. No new pulmonary nodule. No focal consolidation, pleural effusion, or pneumothorax.  Upper Abdomen: No acute abnormality. Mildly enlarged portacaval lymph node measuring 12 mm in short axis, unchanged.  Musculoskeletal: No chest wall mass or suspicious bone lesions identified.  IMPRESSION: 1. Numerous pulmonary nodules in both lungs, measuring up to 8 mm, unchanged since October 2017. These are considered benign given stability over the past 19 months. No further follow-up is required. This recommendation follows the consensus statement: Guidelines for Management of Incidental Pulmonary Nodules Detected on CT Images: From the Fleischner Society 2017; Radiology 2017; 284:228-243. 2. Stable borderline aneurysmal 4.0 cm ascending thoracic aorta. Recommend annual imaging followup by CTA or MRA. This recommendation follows 2010 ACCF/AHA/AATS/ACR/ASA/SCA/SCAI/SIR/STS/SVM Guidelines for the Diagnosis and Management of Patients with Thoracic Aortic Disease. Circulation. 2010; 121: K440-N027 3.  Aortic atherosclerosis (ICD10-I70.0).   Electronically Signed   By: Obie Dredge M.D.   On: 05/26/2017 09:37  Impression:  He has a stable 4.0 cm fusiform ascending aortic aneurysm.  The descending aorta is 26 mm.  His blood pressure is under good control.  He has multiple bilateral small pulmonary nodules measuring up to 8 mm which had not changed since October 2017 and are considered benign given their stability since then.  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  His CT scan does show extensive coronary calcification which is not an indication that there is  any significant coronary occlusive disease but is a marker that that could be a possibility.  He denies any cardiac symptoms except for the  episode of weakness that he had last night while milking his cows last night which she has never had before.  The significance of this is unclear but I did review the signs and symptoms of significant coronary artery disease with him and his wife and if he develops any of those he will seek medical attention.  Plan:  I will plan to see him back in 1 year with a CT of the chest to follow-up on his ascending aortic aneurysm.  His multiple bilateral pulmonary nodules have been stable since October 2017 and are considered benign and do not require any specific follow-up.  I spent 15 minutes performing this established patient evaluation and > 50% of this time was spent face to face counseling and coordinating the care of this patient's aortic aneurysm and multiple bilateral pulmonary nodules.    Alleen Borne, MD [facility] [phone]

## 2017-06-18 ENCOUNTER — Encounter: Payer: Self-pay | Admitting: Family Medicine

## 2017-06-18 ENCOUNTER — Other Ambulatory Visit: Payer: Self-pay

## 2017-06-18 ENCOUNTER — Ambulatory Visit: Payer: Medicare Other | Admitting: Family Medicine

## 2017-06-18 VITALS — BP 118/62 | HR 74 | Temp 97.7°F | Resp 16 | Ht 69.5 in | Wt 227.0 lb

## 2017-06-18 DIAGNOSIS — H6121 Impacted cerumen, right ear: Secondary | ICD-10-CM | POA: Diagnosis not present

## 2017-06-18 MED ORDER — NEOMYCIN-POLYMYXIN-HC 3.5-10000-1 OT SOLN: 4.0000 [drp] | Freq: Four times a day (QID) | OTIC | 0 refills | Status: AC

## 2017-06-18 NOTE — Progress Notes (Signed)
Patient ID: Shane Robinson, male    DOB: 1950/01/08, 67 y.o.   MRN: [id number]  PCP: Donita BrooksPickard, Warren T, MD  Chief Complaint  Patient presents with  . right ear pressure    symptoms for 2 days     Subjective:   Shane Robinson is a 67 y.o. male, presents to clinic with CC of right ear stopped up and mildly irritated with severely decreased hearing x 2 days.  No pain, swelling, redness, drainage.  He's had this before.  He denies using q-tips or any instrumentation of right ear.  No dizziness, vertigo, URI sx, HA, fevers.  Wants ear washed out to remove wax.   Patient Active Problem List   Diagnosis Date Noted  . Pulmonary nodules   . Lumbar pars defect   . HYPERLIPIDEMIA TYPE I / IV 03/14/2009  . HYPERLIPIDEMIA-MIXED 03/14/2009  . HYPERTENSION, UNSPECIFIED 03/14/2009     Prior to Admission medications   Medication Sig Start Date End Date Taking? Authorizing Provider  benazepril (LOTENSIN) 20 MG tablet Take 1 tablet (20 mg total) daily by mouth. 11/10/16  Yes Donita BrooksPickard, Warren T, MD  rosuvastatin (CRESTOR) 20 MG tablet TAKE 1 TABLET BY MOUTH DAILY 02/19/17  Yes Donita BrooksPickard, Warren T, MD  neomycin-polymyxin-hydrocortisone (CORTISPORIN) OTIC solution Place 4 drops into the right ear 4 (four) times daily for 5 days. 06/18/17 06/23/17  Danelle Berryapia, Brandyn Lowrey, PA-C     Allergies  Allergen Reactions  . Latex Hives     Family History  Problem Relation Age of Onset  . Cancer Mother        stomach  . Heart disease Father   . Stroke Father      Social History   Socioeconomic History  . Marital status: Married    Spouse name: Not on file  . Number of children: Not on file  . Years of education: Not on file  . Highest education level: Not on file  Occupational History  . Not on file  Social Needs  . Financial resource strain: Not on file  . Food insecurity:    Worry: Not on file    Inability: Not on file  . Transportation needs:    Medical: Not on file    Non-medical: Not on file    Tobacco Use  . Smoking status: Never Smoker  . Smokeless tobacco: Never Used  Substance and Sexual Activity  . Alcohol use: Yes    Comment: occ  . Drug use: No  . 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 Physical activity:    Days per week: Not on file    Minutes per session: Not on file  . Stress: Not on file  Relationships  . Social connections:    Talks on phone: Not on file    Gets together: Not on file    Attends religious service: Not on file    Active member of club or organization: Not on file    Attends meetings of clubs or organizations: Not on file    Relationship status: Not on file  . Intimate partner violence:    Fear of current or ex partner: Not on file    Emotionally abused: Not on file    Physically abused: Not on file    Forced sexual activity: Not on file  Other Topics Concern  . Not on file  Social History Narrative  . Not on file     Review  of Systems  Constitutional: Negative.   HENT: Negative for congestion, ear discharge, ear pain, facial swelling, postnasal drip, sinus pressure, sinus pain, sore throat, tinnitus and voice change.   Eyes: Negative.   Skin: Negative.   Neurological: Negative.  Negative for dizziness and headaches.  All other systems reviewed and are negative.      Objective:    Vitals:   06/18/17 1549  BP: 118/62  Pulse: 74  Resp: 16  Temp: 97.7 F (36.5 C)  TempSrc: Oral  SpO2: 96%  Weight: 227 lb (103 kg)  Height: 5' 9.5" (1.765 m)      Physical Exam  Constitutional: He appears well-developed and well-nourished. No distress.  HENT:  Head: Normocephalic and atraumatic.  Right Ear: External ear normal. No drainage, swelling or tenderness. No mastoid tenderness. Decreased hearing is noted.  Left Ear: Hearing, tympanic membrane, external ear and ear canal normal.  Nose: Nose normal.  Mouth/Throat: Oropharynx is clear and moist. No oropharyngeal exudate.  Impacted cerumen  to right canal, no edema, no drainage  Eyes: Conjunctivae are normal. Right eye exhibits no discharge. Left eye exhibits no discharge.  Neck: No tracheal deviation present.  Cardiovascular: Normal rate and regular rhythm.  Pulmonary/Chest: Effort normal. No stridor. No respiratory distress.  Musculoskeletal: Normal range of motion.  Neurological: He is alert. He exhibits normal muscle tone. Coordination normal.  Skin: Skin is warm and dry. No rash noted. He is not diaphoretic.  Psychiatric: He has a normal mood and affect. His behavior is normal.  Nursing note and vitals reviewed.    Procedure: Cerumen Disimpaction  Gentle ear lavage with warm water and hydrogen peroxide performed on the right. There were no complications and following the disimpaction the tympanic membrane was visible on the right. Tympanic membranes are intact following the procedure.  Auditory canals are inflamed.  The patient reported moderate improvement of symptoms after removal of cerumen.      Assessment & Plan:      ICD-10-CM   1. Impacted cerumen of right ear H61.21 neomycin-polymyxin-hydrocortisone (CORTISPORIN) OTIC solution    Impacted cerumen of right ear, removed with irrigation, TM intact after, but slightly inflamed with canal inflamed as well.  Will cover with cortisporin drops.   Danelle Berry, PA-C 06/18/17 4:46 PM

## 2017-06-18 NOTE — Patient Instructions (Signed)
Use ear drops if you have any ear irritation or tenderness.   Please call us if you have any swelling, redness, drainage, change in hearing, loss of balance/vertigo/spinning/dizziness    Earwax Buildup, Adult The ears produce a substance called earwax that helps keep bacteria out of the ear and protects the skin in the ear canal. Occasionally, earwax can build up in the ear and cause discomfort or hearing loss. What increases the risk? This condition is more likely to develop in people who:  Are male.  Are elderly.  Naturally produce more earwax.  Clean their ears often with cotton swabs.  Use earplugs often.  Use in-ear headphones often.  Wear hearing aids.  Have narrow ear canals.  Have earwax that is overly thick or sticky.  Have eczema.  Are dehydrated.  Have excess hair in the ear canal.  What are the signs or symptoms? Symptoms of this condition include:  Reduced or muffled hearing.  A feeling of fullness in the ear or feeling that the ear is plugged.  Fluid coming from the ear.  Ear pain.  Ear itch.  Ringing in the ear.  Coughing.  An obvious piece of earwax that can be seen inside the ear canal.  How is this diagnosed? This condition may be diagnosed based on:  Your symptoms.  Your medical history.  An ear exam. During the exam, your health care provider will look into your ear with an instrument called an otoscope.  You may have tests, including a hearing test. How is this treated? This condition may be treated by:  Using ear drops to soften the earwax.  Having the earwax removed by a health care provider. The health care provider may: ? Flush the ear with water. ? Use an instrument that has a loop on the end (curette). ? Use a suction device.  Surgery to remove the wax buildup. This may be done in severe cases.  Follow these instructions at home:  Take over-the-counter and prescription medicines only as told by your health care  provider.  Do not put any objects, including cotton swabs, into your ear. You can clean the opening of your ear canal with a washcloth or facial tissue.  Follow instructions from your health care provider about cleaning your ears. Do not over-clean your ears.  Drink enough fluid to keep your urine clear or pale yellow. This will help to thin the earwax.  Keep all follow-up visits as told by your health care provider. If earwax builds up in your ears often or if you use hearing aids, consider seeing your health care provider for routine, preventive ear cleanings. Ask your health care provider how often you should schedule your cleanings.  If you have hearing aids, clean them according to instructions from the manufacturer and your health care provider. Contact a health care provider if:  You have ear pain.  You develop a fever.  You have blood, pus, or other fluid coming from your ear.  You have hearing loss.  You have ringing in your ears that does not go away.  Your symptoms do not improve with treatment.  You feel like the room is spinning (vertigo). Summary  Earwax can build up in the ear and cause discomfort or hearing loss.  The most common symptoms of this condition include reduced or muffled hearing and a feeling of fullness in the ear or feeling that the ear is plugged.  This condition may be diagnosed based on your symptoms, your medical  history, and an ear exam.  This condition may be treated by using ear drops to soften the earwax or by having the earwax removed by a health care provider.  Do not put any objects, including cotton swabs, into your ear. You can clean the opening of your ear canal with a washcloth or facial tissue. This information is not intended to replace advice given to you by your health care provider. Make sure you discuss any questions you have with your health care provider. Document Released: 01/30/2004 Document Revised: 03/04/2016 Document  Reviewed: 03/04/2016 Elsevier Interactive Patient Education  [employer]2018 [employer]

## 2017-06-25 ENCOUNTER — Ambulatory Visit: Payer: Self-pay | Admitting: Orthopedic Surgery

## 2017-07-14 NOTE — Patient Instructions (Signed)
Shane Robinson  07/14/2017   Your procedure is scheduled on: 07-21-17   [facility]    Report to admitting at 6:30AM    Call this number if you have problems the morning of surgery [phone]     Remember: Do not eat food or drink liquids :After Midnight.     Take these medicines the morning of surgery with A SIP OF WATER: ROSUVASTATIN(CRESTOR)                                You may not have any metal on your body including hair pins and              piercings  Do not wear jewelry, make-up, lotions, powders or perfumes, deodorant                 Men may shave face and neck.   Do not bring valuables to the hospital. [facility] IS NOT             RESPONSIBLE   FOR VALUABLES.  Contacts, dentures or bridgework may not be worn into surgery.  Leave suitcase in the car. After surgery it may be brought to your room.                 Please read over the following fact sheets you were given: _____________________________________________________________________             [facility] - Preparing for [facility] Before surgery, you can play an important role.  Because skin is not sterile, your skin needs to be as free of germs as possible.  You can reduce the number of germs on your skin by washing with CHG (chlorahexidine gluconate) soap before surgery.  CHG is an antiseptic cleaner which kills germs and bonds with the skin to continue killing germs even after washing. Please DO NOT use if you have an allergy to CHG or antibacterial soaps.  If your skin becomes reddened/irritated stop using the CHG and inform your nurse when you arrive at Short Stay. Do not shave (including legs and underarms) for at least 48 hours prior to the first CHG shower.  You may shave your face/neck. Please follow these instructions carefully:  1.  Shower with CHG Soap the night before surgery and the  morning of Surgery.  2.  If you choose to wash your hair,  wash your hair first as usual with your  normal  shampoo.  3.  After you shampoo, rinse your hair and body thoroughly to remove the  shampoo.                           4.  Use CHG as you would any other liquid soap.  You can apply chg directly  to the skin and wash                       Gently with a scrungie or clean washcloth.  5.  Apply the CHG Soap to your body ONLY FROM THE NECK DOWN.   Do not use on face/ open                           Wound or open sores.  Avoid contact with eyes, ears mouth and genitals (private parts).                       Wash face,  Genitals (private parts) with your normal soap.             6.  Wash thoroughly, paying special attention to the area where your surgery  will be performed.  7.  Thoroughly rinse your body with warm water from the neck down.  8.  DO NOT shower/wash with your normal soap after using and rinsing off  the CHG Soap.                9.  Pat yourself dry with a clean towel.            10.  Wear clean pajamas.            11.  Place clean sheets on your bed the night of your first shower and do not  sleep with pets. Day of Surgery : Do not apply any lotions/deodorants the morning of surgery.  Please wear clean clothes to the hospital/surgery center.  FAILURE TO FOLLOW THESE INSTRUCTIONS MAY RESULT IN THE CANCELLATION OF YOUR SURGERY PATIENT SIGNATURE_________________________________  NURSE SIGNATURE__________________________________  ________________________________________________________________________   Shane MireIncentive Spirometer  An incentive spirometer is a tool that can help keep your lungs clear and active. This tool measures how well you are filling your lungs with each breath. Taking long deep breaths may help reverse or decrease the chance of developing breathing (pulmonary) problems (especially infection) following:  A long period of time when you are unable to move or be active. BEFORE THE PROCEDURE   If the spirometer includes an  indicator to show your best effort, your nurse or respiratory therapist will set it to a desired goal.  If possible, sit up straight or lean slightly forward. Try not to slouch.  Hold the incentive spirometer in an upright position. INSTRUCTIONS FOR USE  1. Sit on the edge of your bed if possible, or sit up as far as you can in bed or on a chair. 2. Hold the incentive spirometer in an upright position. 3. Breathe out normally. 4. Place the mouthpiece in your mouth and seal your lips tightly around it. 5. Breathe in slowly and as deeply as possible, raising the piston or the ball toward the top of the column. 6. Hold your breath for 3-5 seconds or for as long as possible. Allow the piston or ball to fall to the bottom of the column. 7. Remove the mouthpiece from your mouth and breathe out normally. 8. Rest for a few seconds and repeat Steps 1 through 7 at least 10 times every 1-2 hours when you are awake. Take your time and take a few normal breaths between deep breaths. 9. The spirometer may include an indicator to show your best effort. Use the indicator as a goal to work toward during each repetition. 10. After each set of 10 deep breaths, practice coughing to be sure your lungs are clear. If you have an incision (the cut made at the time of surgery), support your incision when coughing by placing a pillow or rolled up towels firmly against it. Once you are able to get out of bed, walk around indoors and cough well. You may stop using the incentive spirometer when instructed by your caregiver.  RISKS AND COMPLICATIONS  Take your time so you do not get dizzy or light-headed.  If you are in pain, you may need to take or ask for pain medication before doing incentive spirometry. It is harder to take a deep breath if you are having pain. AFTER USE  Rest and breathe slowly and easily.  It can be helpful to keep track of a log of your progress. Your caregiver can provide you with a simple table  to help with this. If you are using the spirometer at home, follow these instructions: [name] IF:   You are having difficultly using the spirometer.  You have trouble using the spirometer as often as instructed.  Your pain medication is not giving enough relief while using the spirometer.  You develop fever of 100.5 F (38.1 C) or higher. SEEK IMMEDIATE MEDICAL CARE IF:   You cough up bloody sputum that had not been present before.  You develop fever of 102 F (38.9 C) or greater.  You develop worsening pain at or near the incision site. MAKE SURE YOU:   Understand these instructions.  Will watch your condition.  Will get help right away if you are not doing well or get worse. Document Released: 05/04/2006 Document Revised: 03/16/2011 Document Reviewed: 07/05/2006 [facility] Patient Information 2014 [place], [place].   ________________________________________________________________________

## 2017-07-14 NOTE — Progress Notes (Signed)
LOV CARDIOTHORACIC DR Evelene CroonBRYAN BARTLE 05-26-17 Epic   CT CHEST 05-26-17 Epic

## 2017-07-15 ENCOUNTER — Encounter (HOSPITAL_COMMUNITY): Payer: Self-pay

## 2017-07-15 ENCOUNTER — Encounter (HOSPITAL_COMMUNITY)
Admission: RE | Admit: 2017-07-15 | Discharge: 2017-07-15 | Disposition: A | Discharge status: Home / Self Care | Payer: Medicare Other | Source: Ambulatory Visit | Attending: Specialist | Admitting: Specialist

## 2017-07-15 ENCOUNTER — Other Ambulatory Visit: Payer: Self-pay

## 2017-07-15 DIAGNOSIS — M1711 Unilateral primary osteoarthritis, right knee: Secondary | ICD-10-CM | POA: Insufficient documentation

## 2017-07-15 DIAGNOSIS — I10 Essential (primary) hypertension: Secondary | ICD-10-CM | POA: Insufficient documentation

## 2017-07-15 DIAGNOSIS — Z01812 Encounter for preprocedural laboratory examination: Secondary | ICD-10-CM | POA: Diagnosis not present

## 2017-07-15 DIAGNOSIS — Z01818 Encounter for other preprocedural examination: Secondary | ICD-10-CM | POA: Insufficient documentation

## 2017-07-15 HISTORY — DX: Personal history of urinary calculi: Z87.442

## 2017-07-15 LAB — URINALYSIS, ROUTINE W REFLEX MICROSCOPIC
Bilirubin Urine: NEGATIVE
Glucose, UA: NEGATIVE mg/dL
KETONES UR: NEGATIVE mg/dL
Leukocytes, UA: NEGATIVE
Nitrite: NEGATIVE
PH: 5 (ref 5.0–8.0)
Protein, ur: NEGATIVE mg/dL
SPECIFIC GRAVITY, URINE: 1.019 (ref 1.005–1.030)

## 2017-07-15 LAB — PROTIME-INR
INR: 1.1
PROTHROMBIN TIME: 14.1 s (ref 11.4–15.2)

## 2017-07-15 LAB — CBC
HCT: 43.8 % (ref 39.0–52.0)
HEMOGLOBIN: 14.1 g/dL (ref 13.0–17.0)
MCH: 30.9 pg (ref 26.0–34.0)
MCHC: 32.2 g/dL (ref 30.0–36.0)
MCV: 96.1 fL (ref 78.0–100.0)
PLATELETS: 196 10*3/uL (ref 150–400)
RBC: 4.56 MIL/uL (ref 4.22–5.81)
RDW: 13.2 % (ref 11.5–15.5)
WBC: 8.7 10*3/uL (ref 4.0–10.5)

## 2017-07-15 LAB — BASIC METABOLIC PANEL
Anion gap: 8 (ref 5–15)
BUN: 25 mg/dL — ABNORMAL HIGH (ref 8–23)
CHLORIDE: 107 mmol/L (ref 98–111)
CO2: 27 mmol/L (ref 22–32)
Calcium: 9.1 mg/dL (ref 8.9–10.3)
Creatinine, Ser: 1.09 mg/dL (ref 0.61–1.24)
GFR calc non Af Amer: >60 mL/min (ref >60)
Glucose, Bld: 90 mg/dL (ref 70–99)
Potassium: 4.6 mmol/L (ref 3.5–5.1)
Sodium: 142 mmol/L (ref 135–145)

## 2017-07-15 LAB — APTT: aPTT: 29 seconds (ref 24–36)

## 2017-07-16 ENCOUNTER — Ambulatory Visit: Payer: Self-pay | Admitting: Orthopedic Surgery

## 2017-07-16 LAB — SURGICAL PCR SCREEN
MRSA, PCR: NEGATIVE
Staphylococcus aureus: POSITIVE — AB

## 2017-07-16 NOTE — H&P (Signed)
Shane Robinson is an 67 y.o. male.   Chief Complaint: R knee pain HPI: The patient is here today for his history and physical. The patient is scheduled for surgery on 07/21/2017 at [facility]. Dr. Tonita Cong will be doing a right knee arthroplasty.  Shane Robinson reports chronic progressively worsening right knee pain refractory to conservative treatment including medications, activity modifications, relative rest, home exercise program, stem cell trial, cortisone and Visco supplementation injections, bracing, and a prior arthroscopic debridement. At this point his pain is limiting his quality-of-life and activities of daily living and he desires to proceed with further intervention which would be a total knee replacement.  Dr. Tonita Cong and the patient mutually agreed to proceed with a total knee replacement Right knee. Risks and benefits of the procedure were discussed including stiffness, suboptimal range of motion, persistent pain, infection requiring removal of prosthesis and reinsertion, need for prophylactic antibiotics in the future, for example, dental procedures, possible need for manipulation, revision in the future and also anesthetic complications including DVT, PE, etc. We discussed the perioperative course, time in the hospital, postoperative recovery and the need for elevation to control swelling. We also discussed the predicted range of motion and the probability that squatting and kneeling would be unobtainable in the future. In addition, postoperative anticoagulation was discussed. We have obtained preoperative medical clearance as necessary. Provided illustrated handout and discussed it in detail. They will enroll in the total joint replacement educational forum at the hospital.  Past Medical History:  Diagnosis Date  . Allergy   . Elevated lipids   . History of kidney stones    X 2 PASSED ON OWN  . Hyperlipidemia   . Hypertension   . Lumbar pars defect   . Pulmonary nodules   .  Thoracic aortic aneurysm without rupture (HCC)    STABLE 4.1 CM STABLE PER DR BATRLE 05-26-17 EPIC NOTE    Past Surgical History:  Procedure Laterality Date  . HERNIA REPAIR  1975   INGUINAL  . MENISCUS REPAIR RIGHT KNEE  2016  . SHOULDER SURGERY Right 2002   ROTATOR CUFF     Family History  Problem Relation Age of Onset  . Cancer Mother        stomach  . Heart disease Father   . Stroke Father    Social History:  reports that he has never smoked. He has never used smokeless tobacco. He reports that he drinks alcohol. He reports that he does not use drugs.  Allergies:  Allergies  Allergen Reactions  . Latex Hives   Medications: benazepril 20 mg tablet hydroCHLOROthiazide rosuvastatin 20 mg tablet   Results for orders placed or performed during the hospital encounter of 07/15/17 (from the past 48 hour(s))  APTT     Status: None   Collection Time: 07/15/17 11:56 AM  Result Value Ref Range   aPTT 29 24 - 36 seconds    Comment: Performed at [facility], [place] [address], [place], [place] [address]  Basic metabolic panel     Status: Abnormal   Collection Time: 07/15/17 11:56 AM  Result Value Ref Range   Sodium 142 135 - 145 mmol/L   Potassium 4.6 3.5 - 5.1 mmol/L   Chloride 107 98 - 111 mmol/L    Comment: Please note change in reference range.   CO2 27 22 - 32 mmol/L   Glucose, Bld 90 70 - 99 mg/dL    Comment: Please note change in reference range.  BUN 25 (H) 8 - 23 mg/dL    Comment: Please note change in reference range.   Creatinine, Ser 1.09 0.61 - 1.24 mg/dL   Calcium 9.1 8.9 - 10.3 mg/dL   GFR calc non Af Amer >60 >60 mL/min   GFR calc Af Amer >60 >60 mL/min    Comment: (NOTE) The eGFR has been calculated using the CKD EPI equation. This calculation has not been validated in all clinical situations. eGFR's persistently <60 mL/min signify possible Chronic Kidney Disease.    Anion gap 8 5 - 15    Comment: Performed at [facility], [place] [address], [place], [place] [address]  CBC     Status: None   Collection Time: 07/15/17 11:56 AM  Result Value Ref Range   WBC 8.7 4.0 - 10.5 K/uL   RBC 4.56 4.22 - 5.81 MIL/uL   Hemoglobin 14.1 13.0 - 17.0 g/dL   HCT 43.8 39.0 - 52.0 %   MCV 96.1 78.0 - 100.0 fL   MCH 30.9 26.0 - 34.0 pg   MCHC 32.2 30.0 - 36.0 g/dL   RDW 13.2 11.5 - 15.5 %   Platelets 196 150 - 400 K/uL    Comment: Performed at [facility], [place] [address] [place], [place] [address]  Protime-INR     Status: None   Collection Time: 07/15/17 11:56 AM  Result Value Ref Range   Prothrombin Time 14.1 11.4 - 15.2 seconds   INR 1.10     Comment: Performed at [facility], [place] [address] [place], [place] [address]  Urinalysis, Routine w reflex microscopic     Status: Abnormal   Collection Time: 07/15/17 11:56 AM  Result Value Ref Range   Color, Urine YELLOW YELLOW   APPearance CLEAR CLEAR   Specific Gravity, Urine 1.019 1.005 - 1.030   pH 5.0 5.0 - 8.0   Glucose, UA NEGATIVE NEGATIVE mg/dL   Hgb urine dipstick SMALL (A) NEGATIVE   Bilirubin Urine NEGATIVE NEGATIVE   Ketones, ur NEGATIVE NEGATIVE mg/dL   Protein, ur NEGATIVE NEGATIVE mg/dL   Nitrite NEGATIVE NEGATIVE   Leukocytes, UA NEGATIVE NEGATIVE   RBC / HPF 0-5 0 - 5 RBC/hpf   WBC, UA 0-5 0 - 5 WBC/hpf   Bacteria, UA RARE (A) NONE SEEN   Squamous Epithelial / LPF 0-5 0 - 5   Mucus PRESENT     Comment: Performed at [facility], [place] [address], [place], [place] [address]  Surgical pcr screen     Status: Abnormal   Collection Time: 07/15/17 11:56 AM  Result Value Ref Range   MRSA, PCR NEGATIVE NEGATIVE   Staphylococcus aureus POSITIVE (A) NEGATIVE    Comment: (NOTE) The Xpert SA Assay (FDA approved for NASAL specimens in patients 64 years of age and older), is one component of a comprehensive surveillance program. It is not intended to diagnose infection nor to guide or monitor  treatment. Performed at [facility], [place] [address], [place], [place] [address]    No results found.  Review of Systems  Constitutional: Negative.   HENT: Negative.   Eyes: Negative.   Respiratory: Negative.   Cardiovascular: Negative.   Gastrointestinal: Negative.   Genitourinary: Negative.   Musculoskeletal: Positive for joint pain.  Skin: Negative.   Neurological: Negative.   Endo/Heme/Allergies: Negative.     There were no vitals taken for this visit. Physical Exam  Constitutional: He is oriented to person, place, and  time. He appears well-developed and well-nourished.  HENT:  Head: Normocephalic.  Eyes: Pupils are equal, round, and reactive to light.  Neck: Normal range of motion.  Cardiovascular: Normal rate.  Respiratory: Effort normal.  GI: Soft.  Musculoskeletal:  Patient is a 67 year old male.  Constitutional: General Appearance: healthy-appearing and NAD.  Gait and Station: Appearance: antalgic gait.  Cardiovascular System: Arterial Pulses Right: femoral normal, popliteal normal, dorsalis pedis normal, and posterior tibialis normal. Edema Right: no edema. Varicosities Right: no varicosities. Varicosities Left: no varicosities and capillary refill test normal.  Lymph Nodes: Inspection/Palpation Right: no inguinal LAD.  Knees: Inspection Right: no deformity. Bony Palpation Right: no tenderness of the inferior pole patella, the superior pole patella, the tibial tubercle, the medial joint line, the medial tibial plateau, Gerdy's tubercle, or the neck of fibula and tenderness of the medial femoral condyle and the lateral joint line. Soft Tissue Palpation Right: no tenderness of the quadriceps tendon, the prepatellar bursa, the patellar tendon, the medial collateral ligament, the saphenous nerve, the lateral collateral ligament, the infrapatellar tendon, or the common peroneal nerve. Active Range of Motion Right: flexion (90 deg) and extension (-5  deg.) and normal. Stability Right: no laxity or ligamentous instability and anterior drawer sign negative, posterior drawer sign negative, and Lachman test negative. Strength Right: no hamstring weakness or quadriceps weakness and flexion 5/5 and extension 5/5.  Skin: Right Lower Extremity: normal.  Neurologic: Ankle Reflex Right: normal (2). Knee Reflex Right: normal (2). Sensation on the Right: L2 normal, L3 normal, L4 normal, L5 normal, and S1 normal.  Psychiatric: Mood and Affect: active and alert and normal mood.  Neurological: He is alert and oriented to person, place, and time.  Skin: Skin is dry.    X-rays on the cone system reviewed with end-stage degenerative changes.  Assessment/Plan Impression: Right knee end-stage osteoarthritis  Plan: Pt with end-stage Right knee DJD, bone-on-bone, refractory to conservative tx, scheduled for Right total knee replacement by Dr. Tonita Cong on July 21, 2017. We again discussed the procedure itself as well as risks, complications and alternatives, including but not limited to DVT, PE, infx, bleeding, failure of procedure, need for secondary procedure including manipulation, nerve injury, ongoing pain/symptoms, anesthesia risk, even stroke or death. Also discussed typical post-op protocols, activity restrictions, need for PT, flexion/extension exercises, time out of work. Discussed need for DVT ppx post-op per protocol. Discussed dental ppx and infx prevention. Also discussed limitations post-operatively such as kneeling and squatting. All questions were answered. Patient desires to proceed with surgery as scheduled.  Will hold supplements, ASA and NSAIDs accordingly. Will remain NPO after midnight the night before surgery. Will present to [facility] for pre-op testing. Anticipate hospital stay to include at least 2 midnights given medical history and to ensure proper pain control. Plan ASA 376m BID for DVT ppx post-op. Plan Percocet, Robaxin, Colace, Miralax. Plan  home with HHPT post-op with family members at home for assistance. Will follow up 10-14 days post-op for Staple removal and xrays.  Anticipated LOS equal to or greater than 2 midnights due to - Age [age]and older with one or more of the following:  - Obesity  - Expected need for hospital services (PT, OT, Nursing) required for safe  discharge  - Anticipated need for postoperative skilled nursing care or inpatient rehab  - Active co-morbidities: None   Plan right total knee arthroplasty  BCecilie Kicks, PA-C for Dr. BTonita Cong7/12/2017, 12:45 PM

## 2017-07-16 NOTE — H&P (View-Only) (Signed)
Shane Robinson is an 67 y.o. male.   Chief Complaint: R knee pain HPI: The patient is here today for his history and physical. The patient is scheduled for surgery on 07/21/2017 at [facility]. Dr. Tonita Cong will be doing a right knee arthroplasty.  [name] reports chronic progressively worsening right knee pain refractory to conservative treatment including medications, activity modifications, relative rest, home exercise program, stem cell trial, cortisone and Visco supplementation injections, bracing, and a prior arthroscopic debridement. At this point his pain is limiting his quality-of-life and activities of daily living and he desires to proceed with further intervention which would be a total knee replacement.  Dr. Tonita Cong and the patient mutually agreed to proceed with a total knee replacement Right knee. Risks and benefits of the procedure were discussed including stiffness, suboptimal range of motion, persistent pain, infection requiring removal of prosthesis and reinsertion, need for prophylactic antibiotics in the future, for example, dental procedures, possible need for manipulation, revision in the future and also anesthetic complications including DVT, PE, etc. We discussed the perioperative course, time in the hospital, postoperative recovery and the need for elevation to control swelling. We also discussed the predicted range of motion and the probability that squatting and kneeling would be unobtainable in the future. In addition, postoperative anticoagulation was discussed. We have obtained preoperative medical clearance as necessary. Provided illustrated handout and discussed it in detail. They will enroll in the total joint replacement educational forum at the hospital.  Past Medical History:  Diagnosis Date  . Allergy   . Elevated lipids   . History of kidney stones    X 2 PASSED ON OWN  . Hyperlipidemia   . Hypertension   . Lumbar pars defect   . Pulmonary nodules   .  Thoracic aortic aneurysm without rupture (HCC)    STABLE 4.1 CM STABLE PER DR BATRLE 05-26-17 EPIC NOTE    Past Surgical History:  Procedure Laterality Date  . HERNIA REPAIR  1975   INGUINAL  . MENISCUS REPAIR RIGHT KNEE  2016  . SHOULDER SURGERY Right 2002   ROTATOR CUFF     Family History  Problem Relation Age of Onset  . Cancer Mother        stomach  . Heart disease Father   . Stroke Father    Social History:  reports that he has never smoked. He has never used smokeless tobacco. He reports that he drinks alcohol. He reports that he does not use drugs.  Allergies:  Allergies  Allergen Reactions  . Latex Hives   Medications: benazepril 20 mg tablet hydroCHLOROthiazide rosuvastatin 20 mg tablet   Results for orders placed or performed during the hospital encounter of 07/15/17 (from the past 48 hour(s))  APTT     Status: None   Collection Time: 07/15/17 11:56 AM  Result Value Ref Range   aPTT 29 24 - 36 seconds    Comment: Performed at [facility], [place] [address], [place], [place] [address]  Basic metabolic panel     Status: Abnormal   Collection Time: 07/15/17 11:56 AM  Result Value Ref Range   Sodium 142 135 - 145 mmol/L   Potassium 4.6 3.5 - 5.1 mmol/L   Chloride 107 98 - 111 mmol/L    Comment: Please note change in reference range.   CO2 27 22 - 32 mmol/L   Glucose, Bld 90 70 - 99 mg/dL    Comment: Please note change in reference range.  BUN 25 (H) 8 - 23 mg/dL    Comment: Please note change in reference range.   Creatinine, Ser 1.09 0.61 - 1.24 mg/dL   Calcium 9.1 8.9 - 10.3 mg/dL   GFR calc non Af Amer >60 >60 mL/min   GFR calc Af Amer >60 >60 mL/min    Comment: (NOTE) The eGFR has been calculated using the CKD EPI equation. This calculation has not been validated in all clinical situations. eGFR's persistently <60 mL/min signify possible Chronic Kidney Disease.    Anion gap 8 5 - 15    Comment: Performed at [facility], [place] [address], [place], [place] [address]  CBC     Status: None   Collection Time: 07/15/17 11:56 AM  Result Value Ref Range   WBC 8.7 4.0 - 10.5 K/uL   RBC 4.56 4.22 - 5.81 MIL/uL   Hemoglobin 14.1 13.0 - 17.0 g/dL   HCT 43.8 39.0 - 52.0 %   MCV 96.1 78.0 - 100.0 fL   MCH 30.9 26.0 - 34.0 pg   MCHC 32.2 30.0 - 36.0 g/dL   RDW 13.2 11.5 - 15.5 %   Platelets 196 150 - 400 K/uL    Comment: Performed at [facility], [place] [address] [place], [place] [address]  Protime-INR     Status: None   Collection Time: 07/15/17 11:56 AM  Result Value Ref Range   Prothrombin Time 14.1 11.4 - 15.2 seconds   INR 1.10     Comment: Performed at [facility], [place] [address] [place], [place] [address]  Urinalysis, Routine w reflex microscopic     Status: Abnormal   Collection Time: 07/15/17 11:56 AM  Result Value Ref Range   Color, Urine YELLOW YELLOW   APPearance CLEAR CLEAR   Specific Gravity, Urine 1.019 1.005 - 1.030   pH 5.0 5.0 - 8.0   Glucose, UA NEGATIVE NEGATIVE mg/dL   Hgb urine dipstick SMALL (A) NEGATIVE   Bilirubin Urine NEGATIVE NEGATIVE   Ketones, ur NEGATIVE NEGATIVE mg/dL   Protein, ur NEGATIVE NEGATIVE mg/dL   Nitrite NEGATIVE NEGATIVE   Leukocytes, UA NEGATIVE NEGATIVE   RBC / HPF 0-5 0 - 5 RBC/hpf   WBC, UA 0-5 0 - 5 WBC/hpf   Bacteria, UA RARE (A) NONE SEEN   Squamous Epithelial / LPF 0-5 0 - 5   Mucus PRESENT     Comment: Performed at [facility], [place] [address], [place], [place] [address]  Surgical pcr screen     Status: Abnormal   Collection Time: 07/15/17 11:56 AM  Result Value Ref Range   MRSA, PCR NEGATIVE NEGATIVE   Staphylococcus aureus POSITIVE (A) NEGATIVE    Comment: (NOTE) The Xpert SA Assay (FDA approved for NASAL specimens in patients 64 years of age and older), is one component of a comprehensive surveillance program. It is not intended to diagnose infection nor to guide or monitor  treatment. Performed at [facility], [place] [address], [place], [place] [address]    No results found.  Review of Systems  Constitutional: Negative.   HENT: Negative.   Eyes: Negative.   Respiratory: Negative.   Cardiovascular: Negative.   Gastrointestinal: Negative.   Genitourinary: Negative.   Musculoskeletal: Positive for joint pain.  Skin: Negative.   Neurological: Negative.   Endo/Heme/Allergies: Negative.     There were no vitals taken for this visit. Physical Exam  Constitutional: He is oriented to person, place, and  time. He appears well-developed and well-nourished.  HENT:  Head: Normocephalic.  Eyes: Pupils are equal, round, and reactive to light.  Neck: Normal range of motion.  Cardiovascular: Normal rate.  Respiratory: Effort normal.  GI: Soft.  Musculoskeletal:  Patient is a 67 year old male.  Constitutional: General Appearance: healthy-appearing and NAD.  Gait and Station: Appearance: antalgic gait.  Cardiovascular System: Arterial Pulses Right: femoral normal, popliteal normal, dorsalis pedis normal, and posterior tibialis normal. Edema Right: no edema. Varicosities Right: no varicosities. Varicosities Left: no varicosities and capillary refill test normal.  Lymph Nodes: Inspection/Palpation Right: no inguinal LAD.  Knees: Inspection Right: no deformity. Bony Palpation Right: no tenderness of the inferior pole patella, the superior pole patella, the tibial tubercle, the medial joint line, the medial tibial plateau, Gerdy's tubercle, or the neck of fibula and tenderness of the medial femoral condyle and the lateral joint line. Soft Tissue Palpation Right: no tenderness of the quadriceps tendon, the prepatellar bursa, the patellar tendon, the medial collateral ligament, the saphenous nerve, the lateral collateral ligament, the infrapatellar tendon, or the common peroneal nerve. Active Range of Motion Right: flexion (90 deg) and extension (-5  deg.) and normal. Stability Right: no laxity or ligamentous instability and anterior drawer sign negative, posterior drawer sign negative, and Lachman test negative. Strength Right: no hamstring weakness or quadriceps weakness and flexion 5/5 and extension 5/5.  Skin: Right Lower Extremity: normal.  Neurologic: Ankle Reflex Right: normal (2). Knee Reflex Right: normal (2). Sensation on the Right: L2 normal, L3 normal, L4 normal, L5 normal, and S1 normal.  Psychiatric: Mood and Affect: active and alert and normal mood.  Neurological: He is alert and oriented to person, place, and time.  Skin: Skin is dry.    X-rays on the cone system reviewed with end-stage degenerative changes.  Assessment/Plan Impression: Right knee end-stage osteoarthritis  Plan: Pt with end-stage Right knee DJD, bone-on-bone, refractory to conservative tx, scheduled for Right total knee replacement by Dr. Tonita Cong on July 21, 2017. We again discussed the procedure itself as well as risks, complications and alternatives, including but not limited to DVT, PE, infx, bleeding, failure of procedure, need for secondary procedure including manipulation, nerve injury, ongoing pain/symptoms, anesthesia risk, even stroke or death. Also discussed typical post-op protocols, activity restrictions, need for PT, flexion/extension exercises, time out of work. Discussed need for DVT ppx post-op per protocol. Discussed dental ppx and infx prevention. Also discussed limitations post-operatively such as kneeling and squatting. All questions were answered. Patient desires to proceed with surgery as scheduled.  Will hold supplements, ASA and NSAIDs accordingly. Will remain NPO after midnight the night before surgery. Will present to [facility] for pre-op testing. Anticipate hospital stay to include at least 2 midnights given medical history and to ensure proper pain control. Plan ASA 376m BID for DVT ppx post-op. Plan Percocet, Robaxin, Colace, Miralax. Plan  home with HHPT post-op with family members at home for assistance. Will follow up 10-14 days post-op for Staple removal and xrays.  Anticipated LOS equal to or greater than 2 midnights due to - Age 7261and older with one or more of the following:  - Obesity  - Expected need for hospital services (PT, OT, Nursing) required for safe  discharge  - Anticipated need for postoperative skilled nursing care or inpatient rehab  - Active co-morbidities: None   Plan right total knee arthroplasty  BCecilie Kicks, PA-C for Dr. BTonita Cong7/12/2017, 12:45 PM

## 2017-07-20 ENCOUNTER — Encounter (HOSPITAL_COMMUNITY): Payer: Self-pay | Admitting: Certified Registered Nurse Anesthetist

## 2017-07-20 MED ORDER — TRANEXAMIC ACID 1000 MG/10ML IV SOLN
1000.0000 mg | INTRAVENOUS | Status: AC
  Administered 2017-07-21: 1000 mg via INTRAVENOUS
  Filled 2017-07-20: qty 1100

## 2017-07-21 ENCOUNTER — Inpatient Hospital Stay (HOSPITAL_COMMUNITY): Payer: Medicare Other

## 2017-07-21 ENCOUNTER — Encounter (HOSPITAL_COMMUNITY): Payer: Self-pay

## 2017-07-21 ENCOUNTER — Ambulatory Visit (HOSPITAL_COMMUNITY): Payer: Medicare Other | Admitting: Certified Registered Nurse Anesthetist

## 2017-07-21 ENCOUNTER — Other Ambulatory Visit: Payer: Self-pay

## 2017-07-21 ENCOUNTER — Encounter (HOSPITAL_COMMUNITY)
Admission: AD | Disposition: A | Discharge status: Home / Self Care | Payer: Self-pay | Source: Ambulatory Visit | Attending: Specialist

## 2017-07-21 ENCOUNTER — Ambulatory Visit (HOSPITAL_COMMUNITY)
Admission: AD | Admit: 2017-07-21 | Discharge: 2017-07-22 | Disposition: A | Discharge status: Home / Self Care | Payer: Medicare Other | Source: Ambulatory Visit | Attending: Specialist | Admitting: Specialist

## 2017-07-21 DIAGNOSIS — E785 Hyperlipidemia, unspecified: Secondary | ICD-10-CM | POA: Diagnosis not present

## 2017-07-21 DIAGNOSIS — Z79899 Other long term (current) drug therapy: Secondary | ICD-10-CM | POA: Diagnosis not present

## 2017-07-21 DIAGNOSIS — I10 Essential (primary) hypertension: Secondary | ICD-10-CM | POA: Diagnosis not present

## 2017-07-21 DIAGNOSIS — I712 Thoracic aortic aneurysm, without rupture: Secondary | ICD-10-CM | POA: Diagnosis not present

## 2017-07-21 DIAGNOSIS — M21161 Varus deformity, not elsewhere classified, right knee: Secondary | ICD-10-CM | POA: Insufficient documentation

## 2017-07-21 DIAGNOSIS — M1711 Unilateral primary osteoarthritis, right knee: Secondary | ICD-10-CM | POA: Diagnosis not present

## 2017-07-21 DIAGNOSIS — R918 Other nonspecific abnormal finding of lung field: Secondary | ICD-10-CM | POA: Diagnosis not present

## 2017-07-21 DIAGNOSIS — Z8249 Family history of ischemic heart disease and other diseases of the circulatory system: Secondary | ICD-10-CM | POA: Diagnosis not present

## 2017-07-21 DIAGNOSIS — Z823 Family history of stroke: Secondary | ICD-10-CM | POA: Insufficient documentation

## 2017-07-21 DIAGNOSIS — Z800 Family history of malignant neoplasm of digestive organs: Secondary | ICD-10-CM | POA: Insufficient documentation

## 2017-07-21 DIAGNOSIS — Z91040 Latex allergy status: Secondary | ICD-10-CM | POA: Insufficient documentation

## 2017-07-21 DIAGNOSIS — Z87442 Personal history of urinary calculi: Secondary | ICD-10-CM | POA: Diagnosis not present

## 2017-07-21 DIAGNOSIS — I739 Peripheral vascular disease, unspecified: Secondary | ICD-10-CM | POA: Diagnosis not present

## 2017-07-21 DIAGNOSIS — Z96659 Presence of unspecified artificial knee joint: Secondary | ICD-10-CM

## 2017-07-21 HISTORY — PX: TOTAL KNEE ARTHROPLASTY: SHX125

## 2017-07-21 SURGERY — ARTHROPLASTY, KNEE, TOTAL
Anesthesia: Spinal | Site: Knee | Laterality: Right

## 2017-07-21 MED ORDER — DIPHENHYDRAMINE HCL 12.5 MG/5ML PO ELIX: 12.5000 mg | ORAL_SOLUTION | ORAL | Status: DC | PRN

## 2017-07-21 MED ORDER — ONDANSETRON HCL 4 MG/2ML IJ SOLN
INTRAMUSCULAR | Status: DC | PRN
  Administered 2017-07-21: 4 mg via INTRAVENOUS

## 2017-07-21 MED ORDER — CEFAZOLIN SODIUM-DEXTROSE 2-4 GM/100ML-% IV SOLN
2.0000 g | INTRAVENOUS | Status: AC
  Administered 2017-07-21: 2 g via INTRAVENOUS
  Filled 2017-07-21: qty 100

## 2017-07-21 MED ORDER — OXYCODONE HCL 5 MG PO TABS
5.0000 mg | ORAL_TABLET | ORAL | Status: DC | PRN
  Administered 2017-07-21 – 2017-07-22 (×4): 5 mg via ORAL
  Filled 2017-07-21 (×4): qty 1

## 2017-07-21 MED ORDER — PROPOFOL 10 MG/ML IV BOLUS
INTRAVENOUS | Status: AC
  Filled 2017-07-21: qty 20

## 2017-07-21 MED ORDER — PROPOFOL 500 MG/50ML IV EMUL
INTRAVENOUS | Status: DC | PRN
  Administered 2017-07-21: 100 ug/kg/min via INTRAVENOUS

## 2017-07-21 MED ORDER — MIDAZOLAM HCL 2 MG/2ML IJ SOLN
INTRAMUSCULAR | Status: AC
  Filled 2017-07-21: qty 2

## 2017-07-21 MED ORDER — HYDROMORPHONE HCL 1 MG/ML IJ SOLN: 0.5000 mg | INTRAMUSCULAR | Status: DC | PRN

## 2017-07-21 MED ORDER — DEXAMETHASONE SODIUM PHOSPHATE 10 MG/ML IJ SOLN
INTRAMUSCULAR | Status: DC | PRN
Start: 2017-07-21 — End: 2017-07-21
  Administered 2017-07-21: 10 mg via INTRAVENOUS

## 2017-07-21 MED ORDER — ROPIVACAINE HCL 5 MG/ML IJ SOLN
INTRAMUSCULAR | Status: DC | PRN
  Administered 2017-07-21 (×2): 5 mL via PERINEURAL

## 2017-07-21 MED ORDER — FENTANYL CITRATE (PF) 100 MCG/2ML IJ SOLN
INTRAMUSCULAR | Status: DC | PRN
  Administered 2017-07-21 (×2): 50 ug via INTRAVENOUS

## 2017-07-21 MED ORDER — ONDANSETRON HCL 4 MG PO TABS: 4.0000 mg | ORAL_TABLET | Freq: Four times a day (QID) | ORAL | Status: DC | PRN

## 2017-07-21 MED ORDER — BUPIVACAINE-EPINEPHRINE 0.25% -1:200000 IJ SOLN
INTRAMUSCULAR | Status: DC | PRN
  Administered 2017-07-21: 60 mL

## 2017-07-21 MED ORDER — DEXAMETHASONE SODIUM PHOSPHATE 10 MG/ML IJ SOLN
INTRAMUSCULAR | Status: AC
  Filled 2017-07-21: qty 1

## 2017-07-21 MED ORDER — PROPOFOL 10 MG/ML IV BOLUS
INTRAVENOUS | Status: AC
  Filled 2017-07-21: qty 60

## 2017-07-21 MED ORDER — HYDROMORPHONE HCL 1 MG/ML IJ SOLN: 0.2500 mg | INTRAMUSCULAR | Status: DC | PRN

## 2017-07-21 MED ORDER — CLINDAMYCIN PHOSPHATE 900 MG/50ML IV SOLN
900.0000 mg | INTRAVENOUS | Status: AC
  Administered 2017-07-21: 900 mg via INTRAVENOUS
  Filled 2017-07-21: qty 50

## 2017-07-21 MED ORDER — METHOCARBAMOL 1000 MG/10ML IJ SOLN
500.0000 mg | Freq: Four times a day (QID) | INTRAVENOUS | Status: DC | PRN
  Administered 2017-07-21: 500 mg via INTRAVENOUS
  Filled 2017-07-21: qty 550

## 2017-07-21 MED ORDER — FENTANYL CITRATE (PF) 100 MCG/2ML IJ SOLN
INTRAMUSCULAR | Status: AC
  Filled 2017-07-21: qty 2

## 2017-07-21 MED ORDER — PROMETHAZINE HCL 25 MG/ML IJ SOLN: 6.2500 mg | INTRAMUSCULAR | Status: DC | PRN

## 2017-07-21 MED ORDER — ACETAMINOPHEN 500 MG PO TABS
1000.0000 mg | ORAL_TABLET | Freq: Four times a day (QID) | ORAL | Status: AC
  Administered 2017-07-21 – 2017-07-22 (×4): 1000 mg via ORAL
  Filled 2017-07-21 (×4): qty 2

## 2017-07-21 MED ORDER — MEPERIDINE HCL 50 MG/ML IJ SOLN: 6.2500 mg | INTRAMUSCULAR | Status: DC | PRN

## 2017-07-21 MED ORDER — ACETAMINOPHEN 10 MG/ML IV SOLN
1000.0000 mg | INTRAVENOUS | Status: AC
  Administered 2017-07-21: 1000 mg via INTRAVENOUS
  Filled 2017-07-21: qty 100

## 2017-07-21 MED ORDER — ONDANSETRON HCL 4 MG/2ML IJ SOLN
INTRAMUSCULAR | Status: AC
  Filled 2017-07-21: qty 2

## 2017-07-21 MED ORDER — ASPIRIN EC 325 MG PO TBEC: 325.0000 mg | DELAYED_RELEASE_TABLET | Freq: Two times a day (BID) | ORAL | 1 refills | Status: AC

## 2017-07-21 MED ORDER — POLYETHYLENE GLYCOL 3350 17 G PO PACK
17.0000 g | PACK | Freq: Every day | ORAL | Status: DC
  Administered 2017-07-21 – 2017-07-22 (×2): 17 g via ORAL
  Filled 2017-07-21 (×2): qty 1

## 2017-07-21 MED ORDER — BUPIVACAINE-EPINEPHRINE (PF) 0.25% -1:200000 IJ SOLN
INTRAMUSCULAR | Status: AC
  Filled 2017-07-21: qty 30

## 2017-07-21 MED ORDER — ONDANSETRON HCL 4 MG/2ML IJ SOLN
4.0000 mg | Freq: Four times a day (QID) | INTRAMUSCULAR | Status: DC | PRN
Start: 2017-07-21 — End: 2017-07-22

## 2017-07-21 MED ORDER — KCL IN DEXTROSE-NACL 20-5-0.45 MEQ/L-%-% IV SOLN
INTRAVENOUS | Status: AC
  Administered 2017-07-21: 15:00:00 via INTRAVENOUS
  Filled 2017-07-21 (×2): qty 1000

## 2017-07-21 MED ORDER — DOCUSATE SODIUM 100 MG PO CAPS: 100.0000 mg | ORAL_CAPSULE | Freq: Two times a day (BID) | ORAL | 1 refills | Status: DC | PRN

## 2017-07-21 MED ORDER — SODIUM CHLORIDE 0.9 % IV SOLN
INTRAVENOUS | Status: AC
  Filled 2017-07-21: qty 500000

## 2017-07-21 MED ORDER — ASPIRIN EC 325 MG PO TBEC
325.0000 mg | DELAYED_RELEASE_TABLET | Freq: Two times a day (BID) | ORAL | Status: DC
  Administered 2017-07-22 (×2): 325 mg via ORAL
  Filled 2017-07-21 (×2): qty 1

## 2017-07-21 MED ORDER — SODIUM CHLORIDE 0.9 % IR SOLN
Status: DC | PRN
  Administered 2017-07-21: 1000 mL

## 2017-07-21 MED ORDER — POLYMYXIN B SULFATE 500000 UNITS IJ SOLR
INTRAMUSCULAR | Status: DC | PRN
  Administered 2017-07-21: 500 mL

## 2017-07-21 MED ORDER — BISACODYL 5 MG PO TBEC: 5.0000 mg | DELAYED_RELEASE_TABLET | Freq: Every day | ORAL | Status: DC | PRN

## 2017-07-21 MED ORDER — MAGNESIUM CITRATE PO SOLN: 1.0000 | Freq: Once | ORAL | Status: DC | PRN

## 2017-07-21 MED ORDER — CEFAZOLIN SODIUM-DEXTROSE 2-4 GM/100ML-% IV SOLN
2.0000 g | Freq: Four times a day (QID) | INTRAVENOUS | Status: AC
  Administered 2017-07-21 – 2017-07-22 (×3): 2 g via INTRAVENOUS
  Filled 2017-07-21 (×3): qty 100

## 2017-07-21 MED ORDER — BENAZEPRIL HCL 20 MG PO TABS
20.0000 mg | ORAL_TABLET | Freq: Every day | ORAL | Status: DC
  Administered 2017-07-22: 20 mg via ORAL
  Filled 2017-07-21: qty 1
  Filled 2017-07-21: qty 2

## 2017-07-21 MED ORDER — METOCLOPRAMIDE HCL 5 MG PO TABS: 5.0000 mg | ORAL_TABLET | Freq: Three times a day (TID) | ORAL | Status: DC | PRN

## 2017-07-21 MED ORDER — RISAQUAD PO CAPS
1.0000 | ORAL_CAPSULE | Freq: Every day | ORAL | Status: DC
  Administered 2017-07-22: 1 via ORAL
  Filled 2017-07-21: qty 1

## 2017-07-21 MED ORDER — ROPIVACAINE HCL 7.5 MG/ML IJ SOLN
INTRAMUSCULAR | Status: DC | PRN
  Administered 2017-07-21 (×4): 5 mL via PERINEURAL

## 2017-07-21 MED ORDER — MENTHOL 3 MG MT LOZG: 1.0000 | LOZENGE | OROMUCOSAL | Status: DC | PRN

## 2017-07-21 MED ORDER — BUPIVACAINE IN DEXTROSE 0.75-8.25 % IT SOLN
INTRATHECAL | Status: AC
  Filled 2017-07-21: qty 2

## 2017-07-21 MED ORDER — BUPIVACAINE HCL (PF) 0.75 % IJ SOLN
INTRAMUSCULAR | Status: DC | PRN
  Administered 2017-07-21: 2 mL via INTRATHECAL

## 2017-07-21 MED ORDER — PHENOL 1.4 % MT LIQD: 1.0000 | OROMUCOSAL | Status: DC | PRN

## 2017-07-21 MED ORDER — DOCUSATE SODIUM 100 MG PO CAPS
100.0000 mg | ORAL_CAPSULE | Freq: Two times a day (BID) | ORAL | Status: DC
  Administered 2017-07-21 – 2017-07-22 (×2): 100 mg via ORAL
  Filled 2017-07-21 (×2): qty 1

## 2017-07-21 MED ORDER — ACETAMINOPHEN 325 MG PO TABS: 325.0000 mg | ORAL_TABLET | Freq: Four times a day (QID) | ORAL | Status: DC | PRN

## 2017-07-21 MED ORDER — MIDAZOLAM HCL 5 MG/5ML IJ SOLN
INTRAMUSCULAR | Status: DC | PRN
  Administered 2017-07-21: 2 mg via INTRAVENOUS

## 2017-07-21 MED ORDER — ALUM & MAG HYDROXIDE-SIMETH 200-200-20 MG/5ML PO SUSP: 30.0000 mL | ORAL | Status: DC | PRN

## 2017-07-21 MED ORDER — METHOCARBAMOL 500 MG PO TABS: 500.0000 mg | ORAL_TABLET | Freq: Four times a day (QID) | ORAL | 1 refills | Status: DC | PRN

## 2017-07-21 MED ORDER — PHENYLEPHRINE 40 MCG/ML (10ML) SYRINGE FOR IV PUSH (FOR BLOOD PRESSURE SUPPORT)
PREFILLED_SYRINGE | INTRAVENOUS | Status: DC | PRN
  Administered 2017-07-21 (×2): 80 ug via INTRAVENOUS
  Administered 2017-07-21: 40 ug via INTRAVENOUS

## 2017-07-21 MED ORDER — OXYCODONE HCL 5 MG PO TABS: 10.0000 mg | ORAL_TABLET | ORAL | Status: DC | PRN

## 2017-07-21 MED ORDER — METHOCARBAMOL 500 MG PO TABS: 500.0000 mg | ORAL_TABLET | Freq: Four times a day (QID) | ORAL | Status: DC | PRN

## 2017-07-21 MED ORDER — LACTATED RINGERS IV SOLN
INTRAVENOUS | Status: DC
  Administered 2017-07-21 (×2): via INTRAVENOUS

## 2017-07-21 MED ORDER — EPHEDRINE 5 MG/ML INJ
INTRAVENOUS | Status: AC
  Filled 2017-07-21: qty 10

## 2017-07-21 MED ORDER — KETOROLAC TROMETHAMINE 30 MG/ML IJ SOLN: 15.0000 mg | Freq: Once | INTRAMUSCULAR | Status: DC | PRN

## 2017-07-21 MED ORDER — EPHEDRINE SULFATE-NACL 50-0.9 MG/10ML-% IV SOSY
PREFILLED_SYRINGE | INTRAVENOUS | Status: DC | PRN
  Administered 2017-07-21 (×4): 5 mg via INTRAVENOUS

## 2017-07-21 MED ORDER — POLYETHYLENE GLYCOL 3350 17 G PO PACK: 17.0000 g | PACK | Freq: Every day | ORAL | 1 refills | Status: DC

## 2017-07-21 MED ORDER — OXYCODONE HCL 5 MG PO TABS: 5.0000 mg | ORAL_TABLET | ORAL | 0 refills | Status: DC | PRN

## 2017-07-21 MED ORDER — PHENYLEPHRINE 40 MCG/ML (10ML) SYRINGE FOR IV PUSH (FOR BLOOD PRESSURE SUPPORT)
PREFILLED_SYRINGE | INTRAVENOUS | Status: AC
  Filled 2017-07-21: qty 10

## 2017-07-21 MED ORDER — METOCLOPRAMIDE HCL 5 MG/ML IJ SOLN: 5.0000 mg | Freq: Three times a day (TID) | INTRAMUSCULAR | Status: DC | PRN

## 2017-07-21 MED ORDER — PROPOFOL 10 MG/ML IV BOLUS
INTRAVENOUS | Status: DC | PRN
  Administered 2017-07-21: 20 mg via INTRAVENOUS

## 2017-07-21 SURGICAL SUPPLY — 71 items
ANCHOR ROTATOR CUFF #2 (Anchor) ×3 IMPLANT
BAG ZIPLOCK 12X15 (MISCELLANEOUS) IMPLANT
BANDAGE ACE 4X5 VEL STRL LF (GAUZE/BANDAGES/DRESSINGS) ×3 IMPLANT
BANDAGE ACE 6X5 VEL STRL LF (GAUZE/BANDAGES/DRESSINGS) ×3 IMPLANT
BIT DRILL 2.0X128 (BIT) ×2 IMPLANT
BIT DRILL 2.0X128MM (BIT) ×1
BLADE SAG 18X100X1.27 (BLADE) ×3 IMPLANT
BLADE SAW SGTL 11.0X1.19X90.0M (BLADE) ×3 IMPLANT
BLADE SAW SGTL 13.0X1.19X90.0M (BLADE) ×3 IMPLANT
CAPT KNEE TOTAL 3 ATTUNE ×3 IMPLANT
CEMENT HV SMART SET (Cement) ×6 IMPLANT
CLOSURE WOUND 1/2 X4 (GAUZE/BANDAGES/DRESSINGS)
CLOTH 2% CHLOROHEXIDINE 3PK (PERSONAL CARE ITEMS) ×3 IMPLANT
COVER SURGICAL LIGHT HANDLE (MISCELLANEOUS) ×3 IMPLANT
CUFF TOURN SGL QUICK 34 (TOURNIQUET CUFF) ×2
CUFF TRNQT CYL 34X4X40X1 (TOURNIQUET CUFF) ×1 IMPLANT
DECANTER SPIKE VIAL GLASS SM (MISCELLANEOUS) ×3 IMPLANT
DRAPE INCISE IOBAN 66X45 STRL (DRAPES) IMPLANT
DRAPE LG THREE QUARTER DISP (DRAPES) ×2 IMPLANT
DRAPE ORTHO SPLIT 77X108 STRL (DRAPES) ×4
DRAPE SHEET LG 3/4 BI-LAMINATE (DRAPES) ×3 IMPLANT
DRAPE SURG ORHT 6 SPLT 77X108 (DRAPES) ×2 IMPLANT
DRAPE U-SHAPE 47X51 STRL (DRAPES) ×3 IMPLANT
DRSG AQUACEL AG ADV 3.5X10 (GAUZE/BANDAGES/DRESSINGS) ×3 IMPLANT
DRSG TEGADERM 4X4.75 (GAUZE/BANDAGES/DRESSINGS) IMPLANT
DURAPREP 26ML APPLICATOR (WOUND CARE) ×3 IMPLANT
ELECT BLADE TIP CTD 4 INCH (ELECTRODE) ×3 IMPLANT
ELECT REM PT RETURN 15FT ADLT (MISCELLANEOUS) ×3 IMPLANT
EVACUATOR 1/8 PVC DRAIN (DRAIN) IMPLANT
GAUZE SPONGE 2X2 8PLY STRL LF (GAUZE/BANDAGES/DRESSINGS) IMPLANT
GLOVE BIOGEL PI IND STRL 7.5 (GLOVE) ×3 IMPLANT
GLOVE BIOGEL PI IND STRL 8 (GLOVE) ×1 IMPLANT
GLOVE BIOGEL PI INDICATOR 7.5 (GLOVE) ×6
GLOVE BIOGEL PI INDICATOR 8 (GLOVE) ×2
GLOVE SURG SS PI 7.5 STRL IVOR (GLOVE) ×6 IMPLANT
GLOVE SURG SS PI 8.0 STRL IVOR (GLOVE) ×6 IMPLANT
GOWN STRL REUS W/TWL XL LVL3 (GOWN DISPOSABLE) ×6 IMPLANT
HANDPIECE INTERPULSE COAX TIP (DISPOSABLE) ×2
HEMOSTAT SPONGE AVITENE ULTRA (HEMOSTASIS) IMPLANT
HOLDER FOLEY CATH W/STRAP (MISCELLANEOUS) IMPLANT
IMMOBILIZER KNEE 20 (SOFTGOODS) ×3
IMMOBILIZER KNEE 20 THIGH 36 (SOFTGOODS) ×1 IMPLANT
MANIFOLD NEPTUNE II (INSTRUMENTS) ×3 IMPLANT
NDL SAFETY ECLIPSE 18X1.5 (NEEDLE) IMPLANT
NEEDLE HYPO 18GX1.5 SHARP (NEEDLE)
NS IRRIG 1000ML POUR BTL (IV SOLUTION) IMPLANT
PACK TOTAL KNEE CUSTOM (KITS) ×3 IMPLANT
POSITIONER SURGICAL ARM (MISCELLANEOUS) ×3 IMPLANT
SEALER BIPOLAR AQUA 6.0 (INSTRUMENTS) IMPLANT
SET HNDPC FAN SPRY TIP SCT (DISPOSABLE) ×1 IMPLANT
SPONGE GAUZE 2X2 STER 10/PKG (GAUZE/BANDAGES/DRESSINGS)
SPONGE SURGIFOAM ABS GEL 100 (HEMOSTASIS) IMPLANT
STAPLER VISISTAT (STAPLE) ×3 IMPLANT
STRIP CLOSURE SKIN 1/2X4 (GAUZE/BANDAGES/DRESSINGS) IMPLANT
SUT BONE WAX W31G (SUTURE) ×3 IMPLANT
SUT MNCRL AB 4-0 PS2 18 (SUTURE) IMPLANT
SUT STRATAFIX 0 PDS 27 VIOLET (SUTURE) ×3
SUT VIC AB 1 CT1 27 (SUTURE) ×8
SUT VIC AB 1 CT1 27XBRD ANTBC (SUTURE) ×4 IMPLANT
SUT VIC AB 1 CTX 36 (SUTURE)
SUT VIC AB 1 CTX36XBRD ANBCTR (SUTURE) IMPLANT
SUT VIC AB 2-0 CT1 27 (SUTURE) ×6
SUT VIC AB 2-0 CT1 TAPERPNT 27 (SUTURE) ×3 IMPLANT
SUTURE STRATFX 0 PDS 27 VIOLET (SUTURE) ×1 IMPLANT
SYR 3ML LL SCALE MARK (SYRINGE) IMPLANT
SYR 50ML LL SCALE MARK (SYRINGE) ×3 IMPLANT
TOWER CARTRIDGE SMART MIX (DISPOSABLE) ×3 IMPLANT
TRAY FOLEY MTR SLVR 16FR STAT (SET/KITS/TRAYS/PACK) ×3 IMPLANT
WATER STERILE IRR 1000ML POUR (IV SOLUTION) ×3 IMPLANT
WRAP KNEE MAXI GEL POST OP (GAUZE/BANDAGES/DRESSINGS) ×3 IMPLANT
YANKAUER SUCT BULB TIP 10FT TU (MISCELLANEOUS) ×3 IMPLANT

## 2017-07-21 NOTE — Interval H&P Note (Signed)
History and Physical Interval Note:  07/21/2017 8:34 AM  Shane Robinson  has presented today for surgery, with the diagnosis of Right knee osteoarthritis  The various methods of treatment have been discussed with the patient and family. After consideration of risks, benefits and other options for treatment, the patient has consented to  Procedure(s) with comments: RIGHT TOTAL KNEE ARTHROPLASTY (Right) - 120 mins as a surgical intervention .  The patient's history has been reviewed, patient examined, no change in status, stable for surgery.  I have reviewed the patient's chart and labs.  Questions were answered to the patient's satisfaction.     Elic Vencill C

## 2017-07-21 NOTE — Interval H&P Note (Signed)
History and Physical Interval Note:  07/21/2017 8:33 AM  Shane Robinson  has presented today for surgery, with the diagnosis of Right knee osteoarthritis  The various methods of treatment have been discussed with the patient and family. After consideration of risks, benefits and other options for treatment, the patient has consented to  Procedure(s) with comments: RIGHT TOTAL KNEE ARTHROPLASTY (Right) - 120 mins as a surgical intervention .  The patient's history has been reviewed, patient examined, no change in status, stable for surgery.  I have reviewed the patient's chart and labs.  Questions were answered to the patient's satisfaction.     Artis Buechele C

## 2017-07-21 NOTE — Anesthesia Procedure Notes (Signed)
Spinal  Start time: 07/21/2017 8:37 AM End time: 07/21/2017 8:39 AM Staffing Resident/CRNA: Vanessa Durhamochran, Kiasia Chou Glenn, CRNA Performed: resident/CRNA  Preanesthetic Checklist Completed: patient identified, site marked, surgical consent, pre-op evaluation, timeout performed, IV checked, risks and benefits discussed and monitors and equipment checked Spinal Block Patient position: sitting Prep: DuraPrep Patient monitoring: heart rate, continuous pulse ox and blood pressure Approach: midline Location: L3-4 Injection technique: single-shot Needle Needle type: Pencan  Needle gauge: 25 G Needle length: 9 cm Needle insertion depth: 8.5 cm Assessment Sensory level: T6 Additional Notes Expiration dates checked, tolerated without difficulty

## 2017-07-21 NOTE — Evaluation (Signed)
Physical Therapy Evaluation Patient Details Name: Shane Robinson MRN: [id number] DOB: 10/17/1950 Today's Date: 07/21/2017   History of Present Illness  Pt s/p R TKR   Clinical Impression  Pt s/p R TKR and presents with decreased R LE strength/ROM and post op pain limiting functional mobility.  Pt should progress to dc home with family assist.    Follow Up Recommendations Home health PT    Equipment Recommendations  None recommended by PT    Recommendations for Other Services       Precautions / Restrictions Precautions Precautions: Fall;Knee Required Braces or Orthoses: Knee Immobilizer - Right Knee Immobilizer - Right: Discontinue once straight leg raise with < 10 degree lag Restrictions Weight Bearing Restrictions: No Other Position/Activity Restrictions: WBAT      Mobility  Bed Mobility Overal bed mobility: Needs Assistance Bed Mobility: Supine to Sit     Supine to sit: Min assist     General bed mobility comments: cues for sequence and use of L LE to self assist  Transfers Overall transfer level: Needs assistance Equipment used: Rolling walker (2 wheeled) Transfers: Sit to/from Stand Sit to Stand: Min assist         General transfer comment: cues for LE management and use of UEs to self assist  Ambulation/Gait Ambulation/Gait assistance: Min assist Gait Distance (Feet): 35 Feet Assistive device: Rolling walker (2 wheeled) Gait Pattern/deviations: Step-to pattern;Decreased step length - right;Decreased step length - left;Shuffle;Trunk flexed Gait velocity: decr   General Gait Details: cues for sequence, posture and position from [employer]  Stairs            Wheelchair Mobility    Modified Rankin (Stroke Patients Only)       Balance                                             Pertinent Vitals/Pain Pain Assessment: 0-10 Pain Score: 2  Pain Location: R knee Pain Descriptors / Indicators: Aching;Sore Pain Intervention(s):  Limited activity within patient's tolerance;Monitored during session;Premedicated before session;Ice applied    Home Living Family/patient expects to be discharged to:: Private residence Living Arrangements: Spouse/significant other Available Help at Discharge: Family Type of Home: House Home Access: Stairs to enter Entrance Stairs-Rails: None Entrance Stairs-Number of Steps: 2 Home Layout: Able to live on main level with bedroom/bathroom Home Equipment: Cane - single point;Walker - standard;Bedside commode      Prior Function Level of Independence: Independent               Hand Dominance        Extremity/Trunk Assessment   Upper Extremity Assessment Upper Extremity Assessment: Overall WFL for tasks assessed    Lower Extremity Assessment Lower Extremity Assessment: RLE deficits/detail    Cervical / Trunk Assessment Cervical / Trunk Assessment: Normal  Communication   Communication: No difficulties  Cognition Arousal/Alertness: Awake/alert Behavior During Therapy: WFL for tasks assessed/performed Overall Cognitive Status: Within Functional Limits for tasks assessed                                        General Comments      Exercises Total Joint Exercises Ankle Circles/Pumps: AROM;Both;15 reps;Supine   Assessment/Plan    PT Assessment Patient needs continued PT services  PT Problem List Decreased  strength;Decreased range of motion;Decreased activity tolerance;Decreased mobility;Decreased knowledge of use of DME;Pain       PT Treatment Interventions DME instruction;Gait training;Stair training;Functional mobility training;Therapeutic activities;Therapeutic exercise;Patient/family education    PT Goals (Current goals can be found in the Care Plan section)  Acute Rehab PT Goals Patient Stated Goal: Regain IND PT Goal Formulation: With patient Time For Goal Achievement: 07/28/17 Potential to Achieve Goals: Good    Frequency 7X/week    Barriers to discharge        Co-evaluation               AM-PAC PT "6 Clicks" Daily Activity  Outcome Measure Difficulty turning over in bed (including adjusting bedclothes, sheets and blankets)?: Unable Difficulty moving from lying on back to sitting on the side of the bed? : Unable Difficulty sitting down on and standing up from a chair with arms (e.g., wheelchair, bedside commode, etc,.)?: Unable Help needed moving to and from a bed to chair (including a wheelchair)?: A Little Help needed walking in hospital room?: A Little Help needed climbing 3-5 steps with a railing? : A Lot 6 Click Score: 11    End of Session Equipment Utilized During Treatment: Gait belt;Right knee immobilizer Activity Tolerance: Patient tolerated treatment well Patient left: in chair;with call bell/phone within reach;with family/visitor present Nurse Communication: Mobility status PT Visit Diagnosis: Difficulty in walking, not elsewhere classified (R26.2)    Time: 1027-2536 PT Time Calculation (min) (ACUTE ONLY): 30 min   Charges:   PT Evaluation $PT Eval Low Complexity: 1 Low PT Treatments $Gait Training: 8-22 mins   PT G Codes:        Pg [phone]   Coree Riester 07/21/2017, 5:48 PM

## 2017-07-21 NOTE — Brief Op Note (Signed)
07/21/2017  11:19 AM  PATIENT:  Rosezena SensorKelly V Faraj  67 y.o. male  PRE-OPERATIVE DIAGNOSIS:  Right knee osteoarthritis  POST-OPERATIVE DIAGNOSIS:  Right knee osteoarthritis  PROCEDURE:  Procedure(s) with comments: RIGHT TOTAL KNEE ARTHROPLASTY, PERTIAL PATELLECTOMY, REPAIR QUADRICEPS TENDON (Right) - 120 mins  SURGEON:  Surgeon(s) and Role:    Jene Every* Glendy Barsanti, MD - Primary  PHYSICIAN ASSISTANT:   ASSISTANTS: Bissell   ANESTHESIA:   general  EBL:  100 mL   BLOOD ADMINISTERED:none  DRAINS: none   LOCAL MEDICATIONS USED:  MARCAINE     SPECIMEN:  No Specimen  DISPOSITION OF SPECIMEN:  N/A  COUNTS:  YES  TOURNIQUET:   Total Tourniquet Time Documented: Thigh (Right) - -67596 minutes Total: Thigh (Right) - -67596 minutes   DICTATION: .Other Dictation: Dictation Number [id number]  PLAN OF CARE: Admit to inpatient   PATIENT DISPOSITION:  PACU - hemodynamically stable.   Delay start of Pharmacological VTE agent (>24hrs) due to surgical blood loss or risk of bleeding: no

## 2017-07-21 NOTE — Anesthesia Procedure Notes (Signed)
Date/Time: 07/21/2017 8:39 AM Performed by: Vanessa Durhamochran, Tasmia Blumer Glenn, CRNA Oxygen Delivery Method: Simple face mask

## 2017-07-21 NOTE — Anesthesia Procedure Notes (Signed)
Anesthesia Regional Block: Adductor canal block   Pre-Anesthetic Checklist: ,, timeout performed, Correct Patient, Correct Site, Correct Laterality, Correct Procedure, Correct Position, site marked, Risks and benefits discussed,  Surgical consent,  Pre-op evaluation,  At surgeon's request and post-op pain management  Laterality: Right and Lower  Prep: chloraprep       Needles:  Injection technique: Single-shot     Needle Length: 10cm  Needle Gauge: 21   Needle insertion depth: 3 cm   Additional Needles:   Procedures:,,,, ultrasound used (permanent image in chart),,,,  Narrative:  Start time: 07/21/2017 7:52 AM End time: 07/21/2017 8:01 AM Injection made incrementally with aspirations every 5 mL.

## 2017-07-21 NOTE — Anesthesia Postprocedure Evaluation (Signed)
Anesthesia Post Note  Patient: Rosezena SensorKelly V Frakes  Procedure(s) Performed: RIGHT TOTAL KNEE ARTHROPLASTY, PERTIAL PATELLECTOMY, REPAIR QUADRICEPS TENDON (Right Knee)     Patient location during evaluation: PACU Anesthesia Type: Spinal Level of consciousness: awake Pain management: pain level controlled Vital Signs Assessment: post-procedure vital signs reviewed and stable Respiratory status: spontaneous breathing Cardiovascular status: stable Postop Assessment: no headache, no backache, spinal receding, patient able to bend at knees and no apparent nausea or vomiting Anesthetic complications: no    Last Vitals:  Vitals:   07/21/17 1230 07/21/17 1300  BP: 105/74   Pulse: (!) 56 (!) 58  Resp: 20 15  Temp: 36.5 C   SpO2: 100% 97%    Last Pain:  Vitals:   07/21/17 1300  TempSrc:   PainSc: 0-No pain   Pain Goal:    LLE Motor Response: Purposeful movement (07/21/17 1300)   RLE Motor Response: Purposeful movement (07/21/17 1300)   L Sensory Level: L3-Anterior knee, lower leg (07/21/17 1300) R Sensory Level: L3-Anterior knee, lower leg (07/21/17 1300)  Richie Bonanno JR,JOHN Kaelyn Innocent

## 2017-07-21 NOTE — Transfer of Care (Signed)
Immediate Anesthesia Transfer of Care Note  Patient: Rosezena SensorKelly V Schlack  Procedure(s) Performed: RIGHT TOTAL KNEE ARTHROPLASTY, PERTIAL PATELLECTOMY, REPAIR QUADRICEPS TENDON (Right Knee)  Patient Location: PACU  Anesthesia Type:Spinal  Level of Consciousness: drowsy and patient cooperative  Airway & Oxygen Therapy: Patient Spontanous Breathing and Patient connected to face mask  Post-op Assessment: Report given to RN and Post -op Vital signs reviewed and stable  Post vital signs: Reviewed and stable  Last Vitals:  Vitals Value Taken Time  BP 106/72 07/21/2017 11:42 AM  Temp    Pulse 58 07/21/2017 11:45 AM  Resp 17 07/21/2017 11:45 AM  SpO2 100 % 07/21/2017 11:45 AM  Vitals shown include unvalidated device data.  Last Pain:  Vitals:   07/21/17 0709  TempSrc: Oral  PainSc:          Complications: No apparent anesthesia complications

## 2017-07-21 NOTE — Discharge Instructions (Signed)

## 2017-07-21 NOTE — Care Management Obs Status (Signed)
MEDICARE OBSERVATION STATUS NOTIFICATION   Patient Details  Name: Rosezena SensorKelly V Falck MRN: [id number] Date of Birth: 1950-05-18   Medicare Observation Status Notification Given:  Yes    Alexis Goodelleele, Irish Piech K, RN 07/21/2017, 3:43 PM

## 2017-07-21 NOTE — Op Note (Signed)
NAME: Shane Robinson, Shane V. MEDICAL RECORD [id number] ACCOUNT [id number] DATE OF BIRTH:October 29, 1950 FACILITY: [facility] LOCATION: [facility] PHYSICIAN:Sokhna Christoph Connye Burkitt. Jania Steinke, MD  OPERATIVE REPORT  DATE OF PROCEDURE:  07/21/2017  PREOPERATIVE DIAGNOSES:   1.  End-stage osteoarthrosis varus deformity of the right knee. 2.  Previous nonunion of the patella fracture.  POSTOPERATIVE DIAGNOSES:   1.  End-stage osteoarthrosis varus deformity of the right knee. 2.  Previous nonunion of the patella fracture.  PROCEDURE PERFORMED: 1.  Right total knee arthroplasty utilizing DePuy Attune rotating platform #8 femur, #8 tibia 5 insert, 41 patella. 2.  Partial patellectomy. 3.  Partial repair of quadriceps tendon.  ANESTHESIA:  General.  SURGEON:  Javier DockerJeffrey C. Breiana Stratmann, MD  ASSISTANT:  Andrez GrimeJaclyn Bissell PA.  INDICATIONS: A 67 year-old, end-stage osteoarthrosis of the knee in the medial compartment and varus deformity refractory to conservative treatment.  Range of motion is -15 to 90.  Bone-on-bone medial compartment, patellofemoral joint.  History of a  fracture of the patella with the fragments laterally.  He was indicated for repair.  Repair was indicated for replacement of the degenerative joint, possible excision of those fragments.  Risks and benefits discussed including bleeding, infection, damage  to neurovascular structures, no change in symptoms, worsening symptoms, DVT, PE, anesthetic complications, loosening, need for revision, etc.  DESCRIPTION OF PROCEDURE:  The patient placed in supine position.  After induction of adequate general anesthesia, 2 grams Kefzol.  The right lower extremity was prepped and draped and exsanguinated in usual sterile fashion.  Thigh tourniquet inflated to  225 mmHg.  Midline incision was then made over the knee.  Full thickness flaps developed.  Median parapatellar arthrotomy performed.  Patella was translated laterally.  Knee flexed.  Tricompartmental osteoarthrosis was noted  particularly in the medial  compartment.  Remnants of medial and lateral menisci and the ACL were removed.  Osteophytes were removed with a rongeur.  Step drill was utilized to enter the femoral canal.  This was then irrigated.  A 5 degree right intramedullary was placed with off  the distal femur due to the flexion contracture.  A distal femoral cut was then performed.  We then sized off the anterior cortex of the femur to a #8.  This was then pinned in 3 degrees of external rotation and placed a distal femoral cutting block.  I  then performed anterior, posterior and chamfer cuts.  Subluxed the tibia.  Bone-on-bone medial compartment to off the defect, which is medial bisecting the tibiotalar joint, 3 degree slope parallel to the shaft.  This was then pinned and we performed our  cut.  Extension gap was satisfactory  and stable.  We then performed our notch cut centrally, bisecting the canal without difficulty.  We then turned our attention to the patella.  It was everted.  It was highly shaved, thick on the medial aspect  thinning out laterally with a sclerotic border and 2 separate segments that were mobile, connected with fibrous tissue.  I then planed to a 16 mm thickness of the patella, which was flush with the lowest portion of the patella.  I then further  destabilize the lateral fragments.  We then felt that it was appropriate to remove these fragments as it would affect tracking of the patella and instability of the patella.  These were then shelled out with electrocautery and residual osteophytes were  removed.  We sized the patella to a 41 and the residual thickness.  Drilled our peg holes medialized them.  I placed  a trial patella, reduced it and had excellent patellofemoral tracking and good stability to varus valgus stress in 30 degrees.  Negative  anterior drawer.  All instrumentation was removed and we checked posteriorly, cauterized geniculates.  The popliteus was intact,  pulsatile lavage  was used to clean the joint.  Knee was flexed.  All surfaces thoroughly dried as well as the patella.   Next, cement back table in the appropriate fashion.  I placed cement on the components.  I then injected into the tibial canal digitally pressurizing it.  Following this, we impacted the tibial tray.  Redundant cement removed.  We cemented and impacted  the femur.  Redundant cement removed.  I placed a 5 trial insert, reduced it and the 5 trial, which we had used previously.  After the trial components were in, it was optimized at  a 5.  We reduced it to full extension and held axial load throughout the  curing of the cement.  We cemented the patella as well.  Redundant cement removed.  After appropriate curing of the cement, the tourniquet was deflated.  Minimal bleeding was noted.  It was cauterized.  We felt the 5 was appropriate to flex the knee.   Meticulously removed all cement.  We copiously irrigated with pulsatile lavage and inserted our 5 insert and reduced it.  We had full extension, full flexion, good stability to varus valgus stress in 30 degrees.  Negative anterior drawer.  There was good  patellofemoral tracking.  There is a small lateral portion of the quadriceps tendon that was inserted to the previous patellar fragments.  It was repaired to the patella.  I took a 2-prong Mitek suture anchor and impacted into the lateral aspect of the  patella medially between both cortices and then, threaded the Ethibond suture through this portion of the quadriceps tendon and pulled it into the cancellous bed of the exposed patella.  Following this, reduced it and with towel clips.  We had excellent  patellofemoral tracking in slight flexion.  I then repaired the patellar arthrotomy with #1 Vicryl interrupted figure-of-eight sutures oversewn with a running StrataFix.  The patient had excellent patellofemoral tracking following that and good  stability.  Subcu after copious irrigation with 2-0 and skin  with staples.  Wound was dressed sterilely and flexion to gravity at 90 degrees.  Sterile dressing was applied.    The patient was placed in immobilizer, extubated without difficulty and transported to the recovery room in satisfactory condition.  The patient tolerated the procedure well.  No complications.  Minimal blood loss.  Tourniquet time was 70 minutes.  AN/NUANCE  D:07/21/2017 T:07/21/2017 JOB:[id number]

## 2017-07-21 NOTE — Care Management CC44 (Signed)
Condition Code 44 Documentation Completed  Patient Details  Name: Shane Robinson MRN: [id number] Date of Birth: [birth year]   Condition Code 44 given:  Yes Patient signature on Condition Code 44 notice:  Yes Documentation of 2 MD's agreement:  Yes Code 44 added to claim:  Yes    Alexis Goodelleele, Amdrew Oboyle K, RN 07/21/2017, 3:43 PM

## 2017-07-21 NOTE — Anesthesia Preprocedure Evaluation (Signed)
Anesthesia Evaluation  Patient identified by MRN, date of birth, ID band Patient awake    Reviewed: Allergy & Precautions, NPO status , Patient's Chart, lab work & pertinent test results  Airway Mallampati: I       Dental no notable dental hx. (+) Teeth Intact   Pulmonary neg pulmonary ROS,    Pulmonary exam normal breath sounds clear to auscultation       Cardiovascular hypertension, Pt. on medications + Peripheral Vascular Disease  Normal cardiovascular exam Rhythm:Regular Rate:Normal     Neuro/Psych negative neurological ROS  negative psych ROS   GI/Hepatic negative GI ROS, Neg liver ROS,   Endo/Other  negative endocrine ROS  Renal/GU negative Renal ROS     Musculoskeletal  (+) Arthritis , Osteoarthritis,    Abdominal (+) + obese,   Peds  Hematology negative hematology ROS (+)   Anesthesia Other Findings   Reproductive/Obstetrics                             Anesthesia Physical Anesthesia Plan  ASA: II  Anesthesia Plan: Spinal   Post-op Pain Management:  Regional for Post-op pain   Induction:   PONV Risk Score and Plan: 1 and Ondansetron  Airway Management Planned: Natural Airway, Nasal Cannula and Simple Face Mask  Additional Equipment:   Intra-op Plan:   Post-operative Plan:   Informed Consent: I have reviewed the patients History and Physical, chart, labs and discussed the procedure including the risks, benefits and alternatives for the proposed anesthesia with the patient or authorized representative who has indicated his/her understanding and acceptance.     Plan Discussed with: CRNA and Surgeon  Anesthesia Plan Comments:         Anesthesia Quick Evaluation

## 2017-07-22 DIAGNOSIS — M1711 Unilateral primary osteoarthritis, right knee: Secondary | ICD-10-CM | POA: Diagnosis not present

## 2017-07-22 LAB — BASIC METABOLIC PANEL
Anion gap: 7 (ref 5–15)
BUN: 19 mg/dL (ref 8–23)
CALCIUM: 8.4 mg/dL — AB (ref 8.9–10.3)
CO2: 24 mmol/L (ref 22–32)
Chloride: 107 mmol/L (ref 98–111)
Creatinine, Ser: 0.84 mg/dL (ref 0.61–1.24)
GFR calc Af Amer: >60 mL/min (ref >60)
Glucose, Bld: 156 mg/dL — ABNORMAL HIGH (ref 70–99)
POTASSIUM: 4.6 mmol/L (ref 3.5–5.1)
Sodium: 138 mmol/L (ref 135–145)

## 2017-07-22 LAB — CBC
HCT: 41 % (ref 39.0–52.0)
Hemoglobin: 13.3 g/dL (ref 13.0–17.0)
MCH: 31.6 pg (ref 26.0–34.0)
MCHC: 32.4 g/dL (ref 30.0–36.0)
MCV: 97.4 fL (ref 78.0–100.0)
PLATELETS: 163 10*3/uL (ref 150–400)
RBC: 4.21 MIL/uL — ABNORMAL LOW (ref 4.22–5.81)
RDW: 13.2 % (ref 11.5–15.5)
WBC: 16.9 10*3/uL — ABNORMAL HIGH (ref 4.0–10.5)

## 2017-07-22 NOTE — Discharge Summary (Signed)
Physician Discharge Summary   Patient ID: Shane Robinson MRN: [id number] DOB/AGE: 66-Feb-1952 66 y.o.  Admit date: 07/21/2017 Discharge date: 07/22/17  Primary Diagnosis: right knee primary osteoarthritis  Admission Diagnoses:  Past Medical History:  Diagnosis Date  . Allergy   . Elevated lipids   . History of kidney stones    X 2 PASSED ON OWN  . Hyperlipidemia   . Hypertension   . Lumbar pars defect   . Pulmonary nodules   . Thoracic aortic aneurysm without rupture (HCC)    STABLE 4.1 CM STABLE PER DR BATRLE 05-26-17 EPIC NOTE   Discharge Diagnoses:   Active Problems:   Right knee DJD  Estimated body mass index is 33.62 kg/m as calculated from the following:   Height as of this encounter: 5' 9.5" (1.765 m).   Weight as of this encounter: 104.8 kg (231 lb).  Procedure:  Procedure(s) (LRB): RIGHT TOTAL KNEE ARTHROPLASTY, PERTIAL PATELLECTOMY, REPAIR QUADRICEPS TENDON (Right)   Consults: None  HPI: see H&P Laboratory Data: Admission on 07/21/2017  Component Date Value Ref Range Status  . WBC 07/22/2017 16.9* 4.0 - 10.5 K/uL Final  . RBC 07/22/2017 4.21* 4.22 - 5.81 MIL/uL Final  . Hemoglobin 07/22/2017 13.3  13.0 - 17.0 g/dL Final  . HCT 07/22/2017 41.0  39.0 - 52.0 % Final  . MCV 07/22/2017 97.4  78.0 - 100.0 fL Final  . MCH 07/22/2017 31.6  26.0 - 34.0 pg Final  . MCHC 07/22/2017 32.4  30.0 - 36.0 g/dL Final  . RDW 07/22/2017 13.2  11.5 - 15.5 % Final  . Platelets 07/22/2017 163  150 - 400 K/uL Final   Performed at [facility], [place] [address] [place], [place] [address]  . Sodium 07/22/2017 138  135 - 145 mmol/L Final  . Potassium 07/22/2017 4.6  3.5 - 5.1 mmol/L Final  . Chloride 07/22/2017 107  98 - 111 mmol/L Final   Please note change in reference range.  . CO2 07/22/2017 24  22 - 32 mmol/L Final  . Glucose, Bld 07/22/2017 156* 70 - 99 mg/dL Final   Please note change in reference range.  . BUN 07/22/2017 19  8 - 23 mg/dL Final   Please note change in reference range.  . Creatinine, Ser 07/22/2017 0.84  0.61 - 1.24 mg/dL Final  . Calcium 07/22/2017 8.4* 8.9 - 10.3 mg/dL Final  . GFR calc non Af Amer 07/22/2017 >60  >60 mL/min Final  . GFR calc Af Amer 07/22/2017 >60  >60 mL/min Final   Comment: (NOTE) The eGFR has been calculated using the CKD EPI equation. This calculation has not been validated in all clinical situations. eGFR's persistently <60 mL/min signify possible Chronic Kidney Disease.   Georgiann Hahn gap 07/22/2017 7  5 - 15 Final   Performed at [facility], [place] [address], [place], [place] [address]  Hospital Outpatient Visit on 07/15/2017  Component Date Value Ref Range Status  . aPTT 07/15/2017 29  24 - 36 seconds Final   Performed at [facility], [place] [address], [place], [place] [address]  . Sodium 07/15/2017 142  135 - 145 mmol/L Final  . Potassium 07/15/2017 4.6  3.5 - 5.1 mmol/L Final  . Chloride 07/15/2017 107  98 - 111 mmol/L Final   Please note change in reference range.  . CO2 07/15/2017 27  22 - 32 mmol/L Final  . Glucose, Bld 07/15/2017 90  70 - 99 mg/dL Final  Please note change in reference range.  . BUN 07/15/2017 25* 8 - 23 mg/dL Final   Please note change in reference range.  . Creatinine, Ser 07/15/2017 1.09  0.61 - 1.24 mg/dL Final  . Calcium 07/15/2017 9.1  8.9 - 10.3 mg/dL Final  . GFR calc non Af Amer 07/15/2017 >60  >60 mL/min Final  . GFR calc Af Amer 07/15/2017 >60  >60 mL/min Final   Comment: (NOTE) The eGFR has been calculated using the CKD EPI equation. This calculation has not been validated in all clinical situations. eGFR's persistently <60 mL/min signify possible Chronic Kidney Disease.   Georgiann Hahn gap 07/15/2017 8  5 - 15 Final   Performed at [facility], [place] [address], [place], [place] [address]  . WBC 07/15/2017 8.7  4.0 - 10.5 K/uL Final  . RBC 07/15/2017 4.56  4.22 - 5.81 MIL/uL Final  . Hemoglobin  07/15/2017 14.1  13.0 - 17.0 g/dL Final  . HCT 07/15/2017 43.8  39.0 - 52.0 % Final  . MCV 07/15/2017 96.1  78.0 - 100.0 fL Final  . MCH 07/15/2017 30.9  26.0 - 34.0 pg Final  . MCHC 07/15/2017 32.2  30.0 - 36.0 g/dL Final  . RDW 07/15/2017 13.2  11.5 - 15.5 % Final  . Platelets 07/15/2017 196  150 - 400 K/uL Final   Performed at [facility], [place] [address], [place], [place] [address]  . Prothrombin Time 07/15/2017 14.1  11.4 - 15.2 seconds Final  . INR 07/15/2017 1.10   Final   Performed at [facility], [place] [address] [place], [place] [address]  . Color, Urine 07/15/2017 YELLOW  YELLOW Final  . APPearance 07/15/2017 CLEAR  CLEAR Final  . Specific Gravity, Urine 07/15/2017 1.019  1.005 - 1.030 Final  . pH 07/15/2017 5.0  5.0 - 8.0 Final  . Glucose, UA 07/15/2017 NEGATIVE  NEGATIVE mg/dL Final  . Hgb urine dipstick 07/15/2017 SMALL* NEGATIVE Final  . Bilirubin Urine 07/15/2017 NEGATIVE  NEGATIVE Final  . Ketones, ur 07/15/2017 NEGATIVE  NEGATIVE mg/dL Final  . Protein, ur 07/15/2017 NEGATIVE  NEGATIVE mg/dL Final  . Nitrite 07/15/2017 NEGATIVE  NEGATIVE Final  . Leukocytes, UA 07/15/2017 NEGATIVE  NEGATIVE Final  . RBC / HPF 07/15/2017 0-5  0 - 5 RBC/hpf Final  . WBC, UA 07/15/2017 0-5  0 - 5 WBC/hpf Final  . Bacteria, UA 07/15/2017 RARE* NONE SEEN Final  . Squamous Epithelial / LPF 07/15/2017 0-5  0 - 5 Final  . Mucus 07/15/2017 PRESENT   Final   Performed at [facility], [place] [address] [place], [place] [address]  . MRSA, PCR 07/15/2017 NEGATIVE  NEGATIVE Final  . Staphylococcus aureus 07/15/2017 POSITIVE* NEGATIVE Final   Comment: (NOTE) The Xpert SA Assay (FDA approved for NASAL specimens in patients 3 years of age and older), is one component of a comprehensive surveillance program. It is not intended to diagnose infection nor to guide or monitor treatment. Performed at [facility], [place]  [address] [place], [place] [address]      X-Rays:Dg Knee Right Port  Result Date: 07/21/2017 CLINICAL DATA:  Right total knee arthroplasty EXAM: PORTABLE RIGHT KNEE - 1-2 VIEW COMPARISON:  None. FINDINGS: Status post right total knee arthroplasty, with well-positioned right distal femoral and right proximal tibial prostheses. No acute osseous fracture. No dislocation. Expected gas within and surrounding the right knee joint. Vertical skin staples overlie the midline anterior right knee.  Focal irregular calcification posterior to the right knee joint on the lateral view. IMPRESSION: Satisfactory immediate postoperative appearance status post right total knee arthroplasty. Irregular focal calcification posterior to the right knee joint on the lateral view, potentially ossific fragments within a right-sided Baker's cyst. Electronically Signed   By: Ilona Sorrel M.D.   On: 07/21/2017 12:17    EKG: Orders placed or performed during the hospital encounter of 07/15/17  . EKG 12 lead  . EKG 12 lead     Hospital Course: Shane Robinson is a 67 y.o. who was admitted to [facility] They were brought to the operating room on 07/21/2017 and underwent Procedure(s): RIGHT TOTAL KNEE ARTHROPLASTY, PERTIAL PATELLECTOMY, REPAIR QUADRICEPS TENDON.  Patient tolerated the procedure well and was later transferred to the recovery room and then to the orthopaedic floor for postoperative care.  They were given PO and IV analgesics for pain control following their surgery.  They were given 24 hours of postoperative antibiotics of  Anti-infectives (From admission, onward)   Start     Dose/Rate Route Frequency Ordered Stop   07/21/17 1430  ceFAZolin (ANCEF) IVPB 2g/100 mL premix     2 g 200 mL/hr over 30 Minutes Intravenous Every 6 hours 07/21/17 1325 07/22/17 0329   07/21/17 0923  polymyxin B 500,000 Units, bacitracin 50,000 Units in sodium chloride 0.9 % 500 mL irrigation  Status:  Discontinued       As needed  07/21/17 0923 07/21/17 1139   07/21/17 0645  ceFAZolin (ANCEF) IVPB 2g/100 mL premix     2 g 200 mL/hr over 30 Minutes Intravenous On call to O.R. 07/21/17 [id number] 07/21/17 0854   07/21/17 0645  clindamycin (CLEOCIN) IVPB 900 mg     900 mg 100 mL/hr over 30 Minutes Intravenous On call to O.R. 07/21/17 9935 07/21/17 0903     and started on DVT prophylaxis in the form of Aspirin, TED hose and SCDs.   PT and OT were ordered for total joint protocol.  Discharge planning consulted to help with postop disposition and equipment needs.  Patient had a good night on the evening of surgery.  They started to get up OOB with therapy on day one.  Continued to work with therapy into day one post op. By the afternoon on POD1, the patient had progressed with therapy and meeting their goals.  Incision was healing well.  Patient was seen in rounds and was ready to go home.   Diet: Regular diet Activity:WBAT Follow-up:in 10-14 days Disposition - Home Discharged Condition: good    Allergies as of 07/22/2017      Reactions   Latex Hives      Medication List    STOP taking these medications   rosuvastatin 20 MG tablet Commonly known as:  CRESTOR     TAKE these medications   aspirin EC 325 MG tablet Take 1 tablet (325 mg total) by mouth 2 (two) times daily.   benazepril 20 MG tablet Commonly known as:  LOTENSIN Take 1 tablet (20 mg total) daily by mouth.   docusate sodium 100 MG capsule Commonly known as:  COLACE Take 1 capsule (100 mg total) by mouth 2 (two) times daily as needed for mild constipation.   ibuprofen 200 MG tablet Commonly known as:  ADVIL,MOTRIN Take 600 mg by mouth every 6 (six) hours as needed for headache or moderate pain.   methocarbamol 500 MG tablet Commonly known as:  ROBAXIN Take 1 tablet (500 mg total) by  mouth every 6 (six) hours as needed for muscle spasms.   oxyCODONE 5 MG immediate release tablet Commonly known as:  ROXICODONE Take 1-2 tablets (5-10 mg total) by  mouth every 4 (four) hours as needed.   polyethylene glycol packet Commonly known as:  MIRALAX Take 17 g by mouth daily.      Follow-up Information    Susa Day, MD Follow up in 2 week(s).   Specialty:  Orthopedic Surgery Contact information: [address] [place] [address] [phone]           Signed: Lacie Draft, PA-C Orthopaedic Surgery 07/22/2017, 8:39 AM

## 2017-07-22 NOTE — Progress Notes (Signed)
Physical Therapy Treatment Patient Details Name: Shane Robinson MRN: [id number] DOB: 19-Apr-1950 Today's Date: 07/22/2017    History of Present Illness Pt s/p R TKR     PT Comments    Pt motivated and progressing well with mobility.   Follow Up Recommendations  Home health PT     Equipment Recommendations  None recommended by PT    Recommendations for Other Services       Precautions / Restrictions Precautions Precautions: Fall;Knee Required Braces or Orthoses: Knee Immobilizer - Right Knee Immobilizer - Right: Discontinue once straight leg raise with < 10 degree lag Restrictions Weight Bearing Restrictions: No Other Position/Activity Restrictions: WBAT    Mobility  Bed Mobility Overal bed mobility: Needs Assistance Bed Mobility: Supine to Sit     Supine to sit: Min guard     General bed mobility comments: cues for sequence and use of L LE to self assist  Transfers Overall transfer level: Needs assistance Equipment used: Rolling walker (2 wheeled) Transfers: Sit to/from Stand Sit to Stand: Min guard         General transfer comment: cues for LE management and use of UEs to self assist  Ambulation/Gait Ambulation/Gait assistance: Min guard Gait Distance (Feet): 150 Feet Assistive device: Rolling walker (2 wheeled) Gait Pattern/deviations: Step-to pattern;Decreased step length - right;Decreased step length - left;Shuffle;Trunk flexed Gait velocity: decr   General Gait Details: cues for sequence, posture and position from [employer]             Wheelchair Mobility    Modified Rankin (Stroke Patients Only)       Balance Overall balance assessment: No apparent balance deficits (not formally assessed)                                          Cognition Arousal/Alertness: Awake/alert Behavior During Therapy: WFL for tasks assessed/performed Overall Cognitive Status: Within Functional Limits for tasks assessed                                        Exercises Total Joint Exercises Ankle Circles/Pumps: AROM;Both;15 reps;Supine Quad Sets: AROM;Both;10 reps;Supine Heel Slides: AAROM;Left;20 reps;Supine Hip ABduction/ADduction: AAROM;Left;15 reps;Supine    General Comments        Pertinent Vitals/Pain Pain Assessment: 0-10 Pain Score: 1  Pain Location: R knee Pain Descriptors / Indicators: Aching;Sore Pain Intervention(s): Limited activity within patient's tolerance;Monitored during session;Premedicated before session;Ice applied    Home Living                      Prior Function            PT Goals (current goals can now be found in the care plan section) Acute Rehab PT Goals Patient Stated Goal: Regain IND PT Goal Formulation: With patient Time For Goal Achievement: 07/28/17 Potential to Achieve Goals: Good Progress towards PT goals: Progressing toward goals    Frequency    7X/week      PT Plan Current plan remains appropriate    Co-evaluation              AM-PAC PT "6 Clicks" Daily Activity  Outcome Measure  Difficulty turning over in bed (including adjusting bedclothes, sheets and blankets)?: A Lot Difficulty moving from lying on back to  sitting on the side of the bed? : A Lot Difficulty sitting down on and standing up from a chair with arms (e.g., wheelchair, bedside commode, etc,.)?: A Lot Help needed moving to and from a bed to chair (including a wheelchair)?: A Little Help needed walking in hospital room?: A Little Help needed climbing 3-5 steps with a railing? : A Little 6 Click Score: 15    End of Session Equipment Utilized During Treatment: Gait belt;Right knee immobilizer Activity Tolerance: Patient tolerated treatment well Patient left: in chair;with call bell/phone within reach;with family/visitor present Nurse Communication: Mobility status PT Visit Diagnosis: Difficulty in walking, not elsewhere classified (R26.2)     Time:  [id number] PT Time Calculation (min) (ACUTE ONLY): 30 min  Charges:  $Gait Training: 8-22 mins $Therapeutic Exercise: 8-22 mins                    G Codes:       Pg [phone]    Milanni Ayub 07/22/2017, 10:54 AM

## 2017-07-22 NOTE — Progress Notes (Signed)
CSW consult- SNF placement Plan: Home Health  Vivi BarrackNicole Hiba Garry, [place], MSW Clinical Social Worker  [phone] 07/22/2017  8:56 AM

## 2017-07-22 NOTE — Progress Notes (Signed)
Patient ID: Shane Robinson, male   DOB: [birth year], 67 y.o.   MRN: [id number] Subjective: 1 Day Post-Op Procedure(s) (LRB): RIGHT TOTAL KNEE ARTHROPLASTY, PERTIAL PATELLECTOMY, REPAIR QUADRICEPS TENDON (Right) Patient reports pain as 2 on 0-10 scale.    Patient has complaints of R thigh pain (tourniquet pain). No significant knee pain. No calf pain. No numbness or tingling. No N/V, CP, SOB. No other c/o.  We will start therapy today. Plan is to go home with HHPT after hospital stay. He has asked if he can go today.   Objective: Vital signs in last 24 hours: Temp:  [97.4 F (36.3 C)-98.6 F (37 C)] 97.7 F (36.5 C) (07/18 0537) Pulse Rate:  [56-77] 61 (07/18 0537) Resp:  [15-20] 17 (07/18 0537) BP: (104-132)/(72-87) 127/85 (07/18 0537) SpO2:  [95 %-100 %] 100 % (07/18 0537)  Intake/Output from previous day:  Intake/Output Summary (Last 24 hours) at 07/22/2017 0832 Last data filed at 07/22/2017 0558 Gross per 24 hour  Intake 3782.5 ml  Output 3600 ml  Net 182.5 ml    Intake/Output this shift: No intake/output data recorded.  Labs: Results for orders placed or performed during the hospital encounter of 07/21/17  CBC  Result Value Ref Range   WBC 16.9 (H) 4.0 - 10.5 K/uL   RBC 4.21 (L) 4.22 - 5.81 MIL/uL   Hemoglobin 13.3 13.0 - 17.0 g/dL   HCT 40.941.0 81.139.0 - 91.452.0 %   MCV 97.4 78.0 - 100.0 fL   MCH 31.6 26.0 - 34.0 pg   MCHC 32.4 30.0 - 36.0 g/dL   RDW 78.213.2 95.611.5 - 21.315.5 %   Platelets 163 150 - 400 K/uL  Basic metabolic panel  Result Value Ref Range   Sodium 138 135 - 145 mmol/L   Potassium 4.6 3.5 - 5.1 mmol/L   Chloride 107 98 - 111 mmol/L   CO2 24 22 - 32 mmol/L   Glucose, Bld 156 (H) 70 - 99 mg/dL   BUN 19 8 - 23 mg/dL   Creatinine, Ser 0.860.84 0.61 - 1.24 mg/dL   Calcium 8.4 (L) 8.9 - 10.3 mg/dL   GFR calc non Af Amer >60 >60 mL/min   GFR calc Af Amer >60 >60 mL/min   Anion gap 7 5 - 15    Exam - Neurologically intact ABD soft Neurovascular intact Sensation intact  distally Intact pulses distally Dorsiflexion/Plantar flexion intact Incision: dressing C/D/I and no drainage No cellulitis present Compartment soft no calf pain or sign of DVT Dressing - clean, dry, no drainage Motor function intact - moving foot and toes well on exam.   Assessment/Plan: 1 Day Post-Op Procedure(s) (LRB): RIGHT TOTAL KNEE ARTHROPLASTY, PERTIAL PATELLECTOMY, REPAIR QUADRICEPS TENDON (Right)  Advance diet Up with therapy D/C IV fluids Past Medical History:  Diagnosis Date  . Allergy   . Elevated lipids   . History of kidney stones    X 2 PASSED ON OWN  . Hyperlipidemia   . Hypertension   . Lumbar pars defect   . Pulmonary nodules   . Thoracic aortic aneurysm without rupture (HCC)    STABLE 4.1 CM STABLE PER DR BATRLE 05-26-17 EPIC NOTE    Patient's anticipated LOS is less than 2 midnights, meeting these requirements: - Younger than [id number] - Lives within 1 hour of care - Has a competent adult at home to recover with post-op recover - NO history of  - Chronic pain requiring opiods  - Diabetes  - Coronary Artery Disease  - Heart  failure  - Heart attack  - Stroke  - DVT/VTE  - Cardiac arrhythmia  - Respiratory Failure/COPD  - Renal failure  - Anemia  - Advanced Liver disease   DVT Prophylaxis - ASA 325mg  BID Protocol Weight-Bearing as tolerated to Right leg No vaccines. Discussed with Dr. Shelle Iron Possible D/C home later today as long as pain remains well controlled with PT Will order HHPT Discussed D/C instructions  Jozlin Bently M. 07/22/2017, 8:32 AM

## 2017-07-22 NOTE — Progress Notes (Signed)
Discharge planning, spoke with patient and spouse at bedside. Have chosen [facility] for HH PT, evaluate and treat. Contacted [facility] for referral. Has DME. [phone] 

## 2017-07-22 NOTE — Progress Notes (Signed)
Physical Therapy Treatment Patient Details Name: Shane Robinson MRN: [id number] DOB: 1950-01-11 Today's Date: 07/22/2017    History of Present Illness Pt s/p R TKR     PT Comments    Pt progressing well with mobility and eager for dc home.  Reviewed home therex program and stairs with written instruction provided.   Follow Up Recommendations  Home health PT     Equipment Recommendations  None recommended by PT    Recommendations for Other Services       Precautions / Restrictions Precautions Precautions: Fall;Knee Required Braces or Orthoses: Knee Immobilizer - Right Knee Immobilizer - Right: Discontinue once straight leg raise with < 10 degree lag(Pt performed IND SLR) Restrictions Weight Bearing Restrictions: No Other Position/Activity Restrictions: WBAT    Mobility  Bed Mobility Overal bed mobility: Needs Assistance Bed Mobility: Sit to Supine       Sit to supine: Supervision   General bed mobility comments: cues for sequence and use of L LE to self assist  Transfers Overall transfer level: Needs assistance Equipment used: Rolling walker (2 wheeled) Transfers: Sit to/from Stand Sit to Stand: Supervision         General transfer comment: cues for LE management and use of UEs to self assist  Ambulation/Gait Ambulation/Gait assistance: Min guard;Supervision Gait Distance (Feet): 100 Feet Assistive device: Rolling walker (2 wheeled) Gait Pattern/deviations: Step-to pattern;Decreased step length - right;Decreased step length - left;Shuffle;Trunk flexed Gait velocity: decr   General Gait Details: cues for sequence, posture and position from RW   Stairs Stairs: Yes Stairs assistance: Min assist Stair Management: No rails;Step to pattern;Backwards;With walker Number of Stairs: 4 General stair comments: 2 steps twice bkwd with RW and cues for sequence and RW/foot placement   Wheelchair Mobility    Modified Rankin (Stroke Patients Only)        Balance Overall balance assessment: No apparent balance deficits (not formally assessed)                                          Cognition Arousal/Alertness: Awake/alert Behavior During Therapy: WFL for tasks assessed/performed Overall Cognitive Status: Within Functional Limits for tasks assessed                                        Exercises Total Joint Exercises Ankle Circles/Pumps: AROM;Both;15 reps;Supine Quad Sets: AROM;Both;10 reps;Supine Heel Slides: AAROM;Left;20 reps;Supine Hip ABduction/ADduction: AAROM;Left;15 reps;Supine Goniometric ROM: AAROM R knee -10 - 90    General Comments        Pertinent Vitals/Pain Pain Assessment: 0-10 Pain Score: 2  Pain Location: R knee Pain Descriptors / Indicators: Aching;Sore Pain Intervention(s): Limited activity within patient's tolerance;Monitored during session;Premedicated before session    Home Living                      Prior Function            PT Goals (current goals can now be found in the care plan section) Acute Rehab PT Goals Patient Stated Goal: Regain IND PT Goal Formulation: With patient Time For Goal Achievement: 07/28/17 Potential to Achieve Goals: Good Progress towards PT goals: Progressing toward goals    Frequency    7X/week      PT Plan Current plan remains appropriate  Co-evaluation              AM-PAC PT "6 Clicks" Daily Activity  Outcome Measure  Difficulty turning over in bed (including adjusting bedclothes, sheets and blankets)?: A Lot Difficulty moving from lying on back to sitting on the side of the bed? : A Lot Difficulty sitting down on and standing up from a chair with arms (e.g., wheelchair, bedside commode, etc,.)?: A Lot Help needed moving to and from a bed to chair (including a wheelchair)?: A Little Help needed walking in hospital room?: A Little Help needed climbing 3-5 steps with a railing? : A Little 6 Click Score:  15    End of Session Equipment Utilized During Treatment: Gait belt;Right knee immobilizer Activity Tolerance: Patient tolerated treatment well Patient left: with call bell/phone within reach;in bed Nurse Communication: Mobility status PT Visit Diagnosis: Difficulty in walking, not elsewhere classified (R26.2)     Time: 1440-1520 PT Time Calculation (min) (ACUTE ONLY): 40 min  Charges:  $Gait Training: 8-22 mins $Therapeutic Exercise: 8-22 mins                    G Codes:       Pg [phone]    Shane Robinson 07/22/2017, 4:58 PM

## 2017-07-26 ENCOUNTER — Other Ambulatory Visit: Payer: Self-pay | Admitting: Family Medicine

## 2017-08-23 ENCOUNTER — Ambulatory Visit: Payer: Medicare Other | Admitting: Family Medicine

## 2017-08-23 ENCOUNTER — Other Ambulatory Visit: Payer: Self-pay | Admitting: Family Medicine

## 2017-08-23 ENCOUNTER — Encounter: Payer: Self-pay | Admitting: Family Medicine

## 2017-08-23 VITALS — BP 140/70 | HR 78 | Temp 97.8°F | Resp 18 | Ht 69.0 in | Wt 228.0 lb

## 2017-08-23 DIAGNOSIS — M7989 Other specified soft tissue disorders: Secondary | ICD-10-CM | POA: Diagnosis not present

## 2017-08-23 MED ORDER — DOXYCYCLINE HYCLATE 100 MG PO TABS: 100.0000 mg | ORAL_TABLET | Freq: Two times a day (BID) | ORAL | 0 refills | Status: DC

## 2017-08-23 NOTE — Progress Notes (Signed)
Subjective:    Patient ID: Shane Robinson, male    DOB: [birth year], 67 y.o.   MRN: [id number] Patient recently had a right knee replacement.  This is approximately 4 weeks ago.  He did well postoperatively and was discharged home.  Just a few days after being discharged home from the hospital however his right shin became "fiery red", his leg became so painful he did not want the sheet to touch it as he slept.  The leg was swollen distal to the knee.  He was seen by his orthopedist who started him on Keflex for cellulitis.  He took a 10-day course of Keflex with no improvement in the erythema the swelling or the pain.  He was then switched to Bactrim for 7 days to cover possible MRSA.  He states that after he started to take the Bactrim, the redness slowly begin to fade however it only improved 20 or 30%.  The swelling has never improved.  The pain gradually improved however it is still painful even today.  He was started on a second round of Bactrim however had to discontinue the medication after 3 days due to nausea and stomach upset.  He presents today with redness in his right lower extremity distal to his knee, warmth, pitting edema that is trace to +1, and pain.  The pain is on the anterior portion of his leg where it is erythematous.  He has a negative Homans sign.  He is taking aspirin and he is ambulating.  Denies any chest pain or shortness of breath. Past Medical History:  Diagnosis Date  . Allergy   . Elevated lipids   . History of kidney stones    X 2 PASSED ON OWN  . Hyperlipidemia   . Hypertension   . Lumbar pars defect   . Pulmonary nodules   . Thoracic aortic aneurysm without rupture (HCC)    STABLE 4.1 CM STABLE PER DR BATRLE 05-26-17 EPIC NOTE   Past Surgical History:  Procedure Laterality Date  . HERNIA REPAIR  1975   INGUINAL  . MENISCUS REPAIR RIGHT KNEE  2016  . SHOULDER SURGERY Right 2002   ROTATOR CUFF   . TOTAL KNEE ARTHROPLASTY Right 07/21/2017   Procedure: RIGHT  TOTAL KNEE ARTHROPLASTY, PERTIAL PATELLECTOMY, REPAIR QUADRICEPS TENDON;  Surgeon: Jene Every, MD;  Location: [facility];  Service: Orthopedics;  Laterality: Right;  120 mins   Current Outpatient Medications on File Prior to Visit  Medication Sig Dispense Refill  . aspirin EC 325 MG tablet Take 1 tablet (325 mg total) by mouth 2 (two) times daily. 60 tablet 1  . benazepril (LOTENSIN) 20 MG tablet TAKE 1 TABLET BY MOUTH DAILY 90 tablet 0  . docusate sodium (COLACE) 100 MG capsule Take 1 capsule (100 mg total) by mouth 2 (two) times daily as needed for mild constipation. 40 capsule 1  . ibuprofen (ADVIL,MOTRIN) 200 MG tablet Take 600 mg by mouth every 6 (six) hours as needed for headache or moderate pain.    . methocarbamol (ROBAXIN) 500 MG tablet Take 1 tablet (500 mg total) by mouth every 6 (six) hours as needed for muscle spasms. 40 tablet 1  . polyethylene glycol (MIRALAX) packet Take 17 g by mouth daily. 14 each 1  . sulfamethoxazole-trimethoprim (BACTRIM DS,SEPTRA DS) 800-160 MG tablet 1/2 tab po qd     No current facility-administered medications on file prior to visit.    Allergies  Allergen Reactions  . Latex Hives  Social History   Socioeconomic History  . Marital status: Married    Spouse name: Not on file  . Number of children: Not on file  . Years of education: Not on file  . Highest education level: Not on file  Occupational History  . Not on file  Social Needs  . Financial resource strain: Not on file  . Food insecurity:    Worry: Not on file    Inability: Not on file  . Transportation needs:    Medical: Not on file    Non-medical: Not on file  Tobacco Use  . Smoking status: Never Smoker  . Smokeless tobacco: Never Used  Substance and Sexual Activity  . Alcohol use: Yes    Comment: occ  . Drug use: No  . 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 Physical activity:    Days per week: Not on file    Minutes per session: Not  on file  . Stress: Not on file  Relationships  . Social connections:    Talks on phone: Not on file    Gets together: Not on file    Attends religious service: Not on file    Active member of club or organization: Not on file    Attends meetings of clubs or organizations: Not on file    Relationship status: Not on file  . Intimate partner violence:    Fear of current or ex partner: Not on file    Emotionally abused: Not on file    Physically abused: Not on file    Forced sexual activity: Not on file  Other Topics Concern  . Not on file  Social History Narrative  . Not on file     Past Medical History:  Diagnosis Date  . Allergy   . Elevated lipids   . History of kidney stones    X 2 PASSED ON OWN  . Hyperlipidemia   . Hypertension   . Lumbar pars defect   . Pulmonary nodules   . Thoracic aortic aneurysm without rupture (HCC)    STABLE 4.1 CM STABLE PER DR BATRLE 05-26-17 EPIC NOTE   Past Surgical History:  Procedure Laterality Date  . HERNIA REPAIR  1975   INGUINAL  . MENISCUS REPAIR RIGHT KNEE  2016  . SHOULDER SURGERY Right 2002   ROTATOR CUFF   . TOTAL KNEE ARTHROPLASTY Right 07/21/2017   Procedure: RIGHT TOTAL KNEE ARTHROPLASTY, PERTIAL PATELLECTOMY, REPAIR QUADRICEPS TENDON;  Surgeon: Jene Every, MD;  Location: [facility];  Service: Orthopedics;  Laterality: Right;  120 mins   Current Outpatient Medications on File Prior to Visit  Medication Sig Dispense Refill  . aspirin EC 325 MG tablet Take 1 tablet (325 mg total) by mouth 2 (two) times daily. 60 tablet 1  . benazepril (LOTENSIN) 20 MG tablet TAKE 1 TABLET BY MOUTH DAILY 90 tablet 0  . docusate sodium (COLACE) 100 MG capsule Take 1 capsule (100 mg total) by mouth 2 (two) times daily as needed for mild constipation. 40 capsule 1  . ibuprofen (ADVIL,MOTRIN) 200 MG tablet Take 600 mg by mouth every 6 (six) hours as needed for headache or moderate pain.    . methocarbamol (ROBAXIN) 500 MG tablet Take 1 tablet (500  mg total) by mouth every 6 (six) hours as needed for muscle spasms. 40 tablet 1  . polyethylene glycol (MIRALAX) packet Take 17 g by mouth daily. 14 each 1  .  sulfamethoxazole-trimethoprim (BACTRIM DS,SEPTRA DS) 800-160 MG tablet 1/2 tab po qd     No current facility-administered medications on file prior to visit.     Allergies  Allergen Reactions  . Latex Hives   Social History   Socioeconomic History  . Marital status: Married    Spouse name: Not on file  . Number of children: Not on file  . Years of education: Not on file  . Highest education level: Not on file  Occupational History  . Not on file  Social Needs  . Financial resource strain: Not on file  . Food insecurity:    Worry: Not on file    Inability: Not on file  . Transportation needs:    Medical: Not on file    Non-medical: Not on file  Tobacco Use  . Smoking status: Never Smoker  . Smokeless tobacco: Never Used  Substance and Sexual Activity  . Alcohol use: Yes    Comment: occ  . Drug use: No  . 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 Physical activity:    Days per week: Not on file    Minutes per session: Not on file  . Stress: Not on file  Relationships  . Social connections:    Talks on phone: Not on file    Gets together: Not on file    Attends religious service: Not on file    Active member of club or organization: Not on file    Attends meetings of clubs or organizations: Not on file    Relationship status: Not on file  . Intimate partner violence:    Fear of current or ex partner: Not on file    Emotionally abused: Not on file    Physically abused: Not on file    Forced sexual activity: Not on file  Other Topics Concern  . Not on file  Social History Narrative  . Not on file      Review of Systems  All other systems reviewed and are negative.      Objective:   Physical Exam  Cardiovascular: Normal rate, regular rhythm and normal heart  sounds.  Pulmonary/Chest: Effort normal and breath sounds normal. No respiratory distress. He has no wheezes. He has no rales.  Abdominal: There is no tenderness.  Musculoskeletal: He exhibits edema.       Right lower leg: He exhibits tenderness, swelling and edema.       Legs: Neurological: He is alert. He has normal reflexes. He exhibits normal muscle tone.  Skin: There is erythema.  Vitals reviewed.         Assessment & Plan:  Right leg swelling - Plan: US Venous Img Lower Unilateral Right  Most likely this represents cellulitis.  Given the fact he did not improve on Keflex, I suspect MRSA.  That may explain why the erythema faded slightly while taking Bactrim.  However I am concerned that after 10 days of Bactrim, the erythema did not improve any more than it has already.  Furthermore the patient still has pain and swelling raising the concern for a DVT postoperatively.  Therefore I will schedule venous ultrasound of the right lower extremity to evaluate for DVT.  We discussed the risk and the patient elects to go ahead and begin Xarelto 15 mg p.o. twice daily until we can get the ultrasound scheduled tomorrow morning as is already 5:00 at the time of this evaluation.  I believe the  pretest probability for DVT is extremely low and therefore I will not send the patient to the emergency room however he would feel more comfortable treating with Xarelto until knowing the test results.  I will switch him to doxycycline 100 mg p.o. twice daily to cover MRSA as well as Streptococcus await the results of the ultrasound.  We will discontinue Xarelto if the ultrasound is negative for DVT.

## 2017-08-24 ENCOUNTER — Ambulatory Visit (HOSPITAL_COMMUNITY)
Admission: RE | Admit: 2017-08-24 | Discharge: 2017-08-24 | Disposition: A | Discharge status: Home / Self Care | Payer: Medicare Other | Source: Ambulatory Visit | Attending: Family Medicine | Admitting: Family Medicine

## 2017-08-24 DIAGNOSIS — M7989 Other specified soft tissue disorders: Secondary | ICD-10-CM | POA: Insufficient documentation

## 2017-09-03 ENCOUNTER — Ambulatory Visit: Payer: Medicare Other | Admitting: Family Medicine

## 2017-09-07 ENCOUNTER — Other Ambulatory Visit: Payer: Self-pay

## 2017-09-07 DIAGNOSIS — I8311 Varicose veins of right lower extremity with inflammation: Secondary | ICD-10-CM

## 2017-09-08 ENCOUNTER — Ambulatory Visit (HOSPITAL_COMMUNITY)
Admission: RE | Admit: 2017-09-08 | Discharge: 2017-09-08 | Disposition: A | Discharge status: Home / Self Care | Payer: Medicare Other | Source: Ambulatory Visit | Attending: Vascular Surgery | Admitting: Vascular Surgery

## 2017-09-08 ENCOUNTER — Encounter: Payer: Self-pay | Admitting: Vascular Surgery

## 2017-09-08 ENCOUNTER — Ambulatory Visit: Payer: Medicare Other | Admitting: Vascular Surgery

## 2017-09-08 VITALS — BP 124/77 | HR 79 | Temp 98.0°F | Resp 16 | Ht 69.5 in | Wt 227.0 lb

## 2017-09-08 DIAGNOSIS — I83811 Varicose veins of right lower extremities with pain: Secondary | ICD-10-CM

## 2017-09-08 DIAGNOSIS — I872 Venous insufficiency (chronic) (peripheral): Secondary | ICD-10-CM | POA: Insufficient documentation

## 2017-09-08 DIAGNOSIS — I868 Varicose veins of other specified sites: Secondary | ICD-10-CM

## 2017-09-08 DIAGNOSIS — I8311 Varicose veins of right lower extremity with inflammation: Secondary | ICD-10-CM | POA: Insufficient documentation

## 2017-09-08 NOTE — Progress Notes (Signed)
Referring Physician: Dr. Shelle Iron  Patient name: Shane Robinson MRN: [id number] DOB: 1950-06-26 Sex: male  REASON FOR CONSULT: Symptomatic varicose veins with pain  HPI: BRIDGET WOZNICK is a 67 y.o. male underwent right total knee replacement about 6 weeks ago.  Around that time he noticed an area on his calf that was painful and inflamed and swollen.  He was sent for a DVT ultrasound which was negative.  He states that the pain is still present when anything touches his skin over this area.  He has also noticed that the skin in this area has turned brown.  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  He does not really report much swelling but states the skin gets very irritated if anything brushes across it.  He has a family history of varicose veins in his father and brother.  He has not worn compression stockings in the past but has worn TED hose.  He has had no other procedures on his leg other than his knee replacement.  He still has some residual swelling around the knee replacement.  Other medical problems include Asik aneurysm followed by Dr. Virgel Gess, hypertension, hyperlipidemia all of which are currently stable.  Past Medical History:  Diagnosis Date  . Allergy   . Elevated lipids   . History of kidney stones    X 2 PASSED ON OWN  . Hyperlipidemia   . Hypertension   . Lumbar pars defect   . Pulmonary nodules   . Thoracic aortic aneurysm without rupture (HCC)    STABLE 4.1 CM STABLE PER DR BATRLE 05-26-17 EPIC NOTE   Past Surgical History:  Procedure Laterality Date  . HERNIA REPAIR  1975   INGUINAL  . MENISCUS REPAIR RIGHT KNEE  2016  . SHOULDER SURGERY Right 2002   ROTATOR CUFF   . TOTAL KNEE ARTHROPLASTY Right 07/21/2017   Procedure: RIGHT TOTAL KNEE ARTHROPLASTY, PERTIAL PATELLECTOMY, REPAIR QUADRICEPS TENDON;  Surgeon: Jene Every, MD;  Location: [facility];  Service: Orthopedics;  Laterality: Right;  120 mins    Family History  Problem Relation Age of Onset    . Cancer Mother        stomach  . Heart disease Father   . Stroke Father     SOCIAL HISTORY: Social History   Socioeconomic History  . Marital status: Married    Spouse name: Not on file  . Number of children: Not on file  . Years of education: Not on file  . Highest education level: Not on file  Occupational History  . Not on file  Social Needs  . Financial resource strain: Not on file  . Food insecurity:    Worry: Not on file    Inability: Not on file  . Transportation needs:    Medical: Not on file    Non-medical: Not on file  Tobacco Use  . Smoking status: Never Smoker  . Smokeless tobacco: Never Used  Substance and Sexual Activity  . Alcohol use: Yes    Comment: occ  . Drug use: No  . 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 Physical activity:    Days per week: Not on file    Minutes per session: Not on file  . Stress: Not on file  Relationships  . Social connections:    Talks on phone: Not on file    Gets together: Not on file  Attends religious service: Not on file    Active member of club or organization: Not on file    Attends meetings of clubs or organizations: Not on file    Relationship status: Not on file  . Intimate partner violence:    Fear of current or ex partner: Not on file    Emotionally abused: Not on file    Physically abused: Not on file    Forced sexual activity: Not on file  Other Topics Concern  . Not on file  Social History Narrative  . Not on file    Allergies  Allergen Reactions  . Latex Hives    Current Outpatient Medications  Medication Sig Dispense Refill  . aspirin EC 325 MG tablet Take 1 tablet (325 mg total) by mouth 2 (two) times daily. 60 tablet 1  . benazepril (LOTENSIN) 20 MG tablet TAKE 1 TABLET BY MOUTH DAILY 90 tablet 0  . docusate sodium (COLACE) 100 MG capsule Take 1 capsule (100 mg total) by mouth 2 (two) times daily as needed for mild constipation. 40 capsule 1  .  doxycycline (VIBRA-TABS) 100 MG tablet Take 1 tablet (100 mg total) by mouth 2 (two) times daily. 20 tablet 0  . ibuprofen (ADVIL,MOTRIN) 200 MG tablet Take 600 mg by mouth every 6 (six) hours as needed for headache or moderate pain.    . methocarbamol (ROBAXIN) 500 MG tablet Take 1 tablet (500 mg total) by mouth every 6 (six) hours as needed for muscle spasms. 40 tablet 1  . sulfamethoxazole-trimethoprim (BACTRIM DS,SEPTRA DS) 800-160 MG tablet 1/2 tab po qd    . polyethylene glycol (MIRALAX) packet Take 17 g by mouth daily. (Patient not taking: Reported on 09/08/2017) 14 each 1   No current facility-administered medications for this visit.     ROS:   General:  No weight loss, Fever, chills  HEENT: No recent headaches, no nasal bleeding, no visual changes, no sore throat  Neurologic: No dizziness, blackouts, seizures. No recent symptoms of stroke or mini- stroke. No recent episodes of slurred speech, or temporary blindness.  Cardiac: No recent episodes of chest pain/pressure, no shortness of breath at rest.  No shortness of breath with exertion.  Denies history of atrial fibrillation or irregular heartbeat  Vascular: No history of rest pain in feet.  No history of claudication.  No history of non-healing ulcer, No history of DVT   Pulmonary: No home oxygen, no productive cough, no hemoptysis,  No asthma or wheezing  Musculoskeletal:  [X]  Arthritis, [ ]  Low back pain,  [X]  Joint pain  Hematologic:No history of hypercoagulable state.  No history of easy bleeding.  No history of anemia  Gastrointestinal: No hematochezia or melena,  No gastroesophageal reflux, no trouble swallowing  Urinary: [ ]  chronic Kidney disease, [ ]  on HD - [ ]  MWF or [ ]  TTHS, [ ]  Burning with urination, [ ]  Frequent urination, [ ]  Difficulty urinating;   Skin: No rashes  Psychological: No history of anxiety,  No history of depression   Physical Examination  Vitals:   09/08/17 1303  BP: 124/77  Pulse: 79    Resp: 16  Temp: 98 F (36.7 C)  SpO2: 96%  Weight: 227 lb (103 kg)  Height: 5' 9.5" (1.765 m)    Body mass index is 33.04 kg/m.  General:  Alert and oriented, no acute distress HEENT: Normal Neck: No bruit or JVD Pulmonary: Clear to auscultation bilaterally Cardiac: Regular Rate and Rhythm  Abdomen: Soft, non-tender,  non-distended, no mass Skin: No rash, brawny hemosiderin staining diffusely circumferential right calf extending to the ankle palpable cluster of varicosities right posterior medial knee 5 to 6 mm diameter veins over an area of 7 cm Extremity Pulses:  2+ radial, brachial, femoral, dorsalis pedis, posterior tibial pulses bilaterally Musculoskeletal: No deformity diffuse edema right leg extending from the knee all the way down to the ankle  Neurologic: Upper and lower extremity motor 5/5 and symmetric  DATA:  Patient had a venous reflux exam today which showed diffuse reflux throughout the greater saphenous vein although the saphenofemoral junction was competent.  He also had deep vein reflux.  Vein diameter was 6 to 10 mm.  ASSESSMENT: Symptomatic varicose veins with superficial venous reflux in the right greater saphenous vein resulting in skin changes with hemosiderin staining and pain and skin irritation.  History also sounds like he may have had an episode of thrombophlebitis after his knee replacement.   PLAN: Patient was given a prescription today for long leg compression stockings.  He will wear these daily and return in 3 months for further follow-up for consideration of laser ablation of his right greater saphenous vein pending insurance approval.  Fabienne Bruns, MD [facility] Office: [phone] Pager: [phone]

## 2017-09-14 ENCOUNTER — Encounter: Payer: Self-pay | Admitting: Family Medicine

## 2017-11-01 ENCOUNTER — Other Ambulatory Visit: Payer: Self-pay | Admitting: Family Medicine

## 2017-11-10 DIAGNOSIS — Z96651 Presence of right artificial knee joint: Secondary | ICD-10-CM | POA: Insufficient documentation

## 2017-12-15 ENCOUNTER — Ambulatory Visit: Payer: Medicare Other | Admitting: Vascular Surgery

## 2017-12-16 ENCOUNTER — Encounter: Payer: Self-pay | Admitting: Vascular Surgery

## 2018-01-17 ENCOUNTER — Other Ambulatory Visit: Payer: Self-pay | Admitting: Family Medicine

## 2018-02-14 ENCOUNTER — Other Ambulatory Visit: Payer: Self-pay | Admitting: Family Medicine

## 2018-03-02 ENCOUNTER — Encounter: Payer: Self-pay | Admitting: Vascular Surgery

## 2018-03-02 ENCOUNTER — Ambulatory Visit: Payer: Medicare Other | Admitting: Vascular Surgery

## 2018-03-02 ENCOUNTER — Other Ambulatory Visit: Payer: Self-pay

## 2018-03-02 VITALS — BP 128/78 | HR 70 | Temp 97.3°F | Resp 18 | Ht 67.0 in | Wt 228.6 lb

## 2018-03-02 DIAGNOSIS — I83811 Varicose veins of right lower extremities with pain: Secondary | ICD-10-CM

## 2018-03-02 NOTE — Progress Notes (Signed)
Patient is a 68 year old male who returns for follow-up today.  He was last seen September 2019.  At that time he was complaining of symptoms of swelling pain and aching in his right lower extremity from varicose veins.  Was given a prescription for thigh-high compression stockings.  He has been wearing these good compliance.  He works a job that requires him to stand on his feet all day and this is limiting to him.  He has not yet had any skin breakdown but has had some skin changes with brownish discoloration of the skin.  He is also had what sounds like at least one episode of thrombophlebitis.  Review of systems: He denies shortness of breath.  He denies chest pain.  Social History   Socioeconomic History  . Marital status: Married    Spouse name: Not on file  . Number of children: Not on file  . Years of education: Not on file  . Highest education level: Not on file  Occupational History  . Not on file  Social Needs  . Financial resource strain: Not on file  . Food insecurity:    Worry: Not on file    Inability: Not on file  . Transportation needs:    Medical: Not on file    Non-medical: Not on file  Tobacco Use  . Smoking status: Never Smoker  . Smokeless tobacco: Never Used  Substance and Sexual Activity  . Alcohol use: Yes    Comment: occ  . Drug use: No  . 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 Physical activity:    Days per week: Not on file    Minutes per session: Not on file  . Stress: Not on file  Relationships  . Social connections:    Talks on phone: Not on file    Gets together: Not on file    Attends religious service: Not on file    Active member of club or organization: Not on file    Attends meetings of clubs or organizations: Not on file    Relationship status: Not on file  . Intimate partner violence:    Fear of current or ex partner: Not on file    Emotionally abused: Not on file    Physically abused: Not on  file    Forced sexual activity: Not on file  Other Topics Concern  . Not on file  Social History Narrative  . Not on file     Current Outpatient Medications on File Prior to Visit  Medication Sig Dispense Refill  . aspirin EC 325 MG tablet Take 1 tablet (325 mg total) by mouth 2 (two) times daily. 60 tablet 1  . benazepril (LOTENSIN) 20 MG tablet TAKE 1 TABLET BY MOUTH ONCE DAILY 90 tablet 0  . ibuprofen (ADVIL,MOTRIN) 200 MG tablet Take 600 mg by mouth every 6 (six) hours as needed for headache or moderate pain.    . rosuvastatin (CRESTOR) 20 MG tablet TAKE 1 TABLET BY MOUTH ONCE DAILY 90 tablet 0  . docusate sodium (COLACE) 100 MG capsule Take 1 capsule (100 mg total) by mouth 2 (two) times daily as needed for mild constipation. (Patient not taking: Reported on 03/02/2018) 40 capsule 1  . doxycycline (VIBRA-TABS) 100 MG tablet Take 1 tablet (100 mg total) by mouth 2 (two) times daily. (Patient not taking: Reported on 03/02/2018) 20 tablet 0  . methocarbamol (ROBAXIN) 500 MG tablet Take 1 tablet (500  mg total) by mouth every 6 (six) hours as needed for muscle spasms. (Patient not taking: Reported on 03/02/2018) 40 tablet 1  . polyethylene glycol (MIRALAX) packet Take 17 g by mouth daily. (Patient not taking: Reported on 09/08/2017) 14 each 1  . sulfamethoxazole-trimethoprim (BACTRIM DS,SEPTRA DS) 800-160 MG tablet 1/2 tab po qd     No current facility-administered medications on file prior to visit.      Past Medical History:  Diagnosis Date  . Allergy   . Elevated lipids   . History of kidney stones    X 2 PASSED ON OWN  . Hyperlipidemia   . Hypertension   . Lumbar pars defect   . Pulmonary nodules   . Thoracic aortic aneurysm without rupture (HCC)    STABLE 4.1 CM STABLE PER DR BATRLE 05-26-17 EPIC NOTE    Physical exam:  Vitals:   03/02/18 1406  BP: 128/78  Pulse: 70  Resp: 18  Temp: (!) 97.3 F (36.3 C)  TempSrc: Oral  SpO2: 96%  Weight: 228 lb 9.9 oz (103.7 kg)    Height: 5\' 7"  (1.702 m)    Right lower extremity: 2+ dorsalis pedis pulse asymmetry of leg right leg is approximately 10% larger than the left, large palpable and visible on the skin varicosities right medial knee 6 to 8 mm diameter  Skin: Hemosiderin staining and changes consistent with venous reflux in the mid calf circumferentially right leg, no ulcer          Data: I did a SonoSite ultrasound exam of the patient's right lower extremity today which revealed a 6 mm greater saphenous vein extending from the knee all the way up to the mid thigh then taper slightly to about 4 mm up to the saphenofemoral junction.   Assessment: Symptomatic varicose veins with pain aching swelling and skin changes.  This has not significantly resolved with compression alone.  He has evidence of reflux on his greater saphenous on duplex ultrasound.  Plan: Laser ablation with stab avulsions 5-10 right lower extremity in the near future pending insurance approval.  Risk benefits possible complications and procedure details were discussed with patient today including not limited to bleeding deep vein thrombosis pain swelling post procedure.  He understands agrees to proceed.  Fabienne Bruns, MD [facility] Office: [phone] Pager: [phone]

## 2018-04-04 ENCOUNTER — Other Ambulatory Visit: Payer: Self-pay | Admitting: Surgery

## 2018-04-04 DIAGNOSIS — I712 Thoracic aortic aneurysm, without rupture, unspecified: Secondary | ICD-10-CM

## 2018-04-04 DIAGNOSIS — R911 Solitary pulmonary nodule: Secondary | ICD-10-CM

## 2018-04-18 ENCOUNTER — Other Ambulatory Visit: Payer: Self-pay | Admitting: Family Medicine

## 2018-05-16 ENCOUNTER — Other Ambulatory Visit: Payer: Self-pay | Admitting: Family Medicine

## 2018-06-01 ENCOUNTER — Ambulatory Visit: Payer: Medicare Other | Admitting: Surgery

## 2018-06-01 ENCOUNTER — Other Ambulatory Visit: Payer: Self-pay

## 2018-06-01 ENCOUNTER — Ambulatory Visit
Admission: RE | Admit: 2018-06-01 | Discharge: 2018-06-01 | Disposition: A | Discharge status: Home / Self Care | Payer: Medicare Other | Source: Ambulatory Visit | Attending: Surgery | Admitting: Surgery

## 2018-06-01 DIAGNOSIS — I712 Thoracic aortic aneurysm, without rupture, unspecified: Secondary | ICD-10-CM

## 2018-06-01 DIAGNOSIS — R911 Solitary pulmonary nodule: Secondary | ICD-10-CM

## 2018-06-02 ENCOUNTER — Encounter: Payer: Self-pay | Admitting: Surgery

## 2018-06-02 ENCOUNTER — Ambulatory Visit: Payer: Medicare Other | Admitting: Surgery

## 2018-06-02 VITALS — BP 150/85 | HR 74 | Temp 97.7°F | Resp 20 | Ht 69.0 in | Wt 230.0 lb

## 2018-06-02 DIAGNOSIS — I712 Thoracic aortic aneurysm, without rupture, unspecified: Secondary | ICD-10-CM

## 2018-06-02 DIAGNOSIS — R911 Solitary pulmonary nodule: Secondary | ICD-10-CM

## 2018-06-02 NOTE — Progress Notes (Signed)
HPI:  The patient returns today for follow-up of a 4.1 cm fusiform ascending aortic aneurysm and multiple small bilateral pulmonary nodules.  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  He did have a right knee replaced in July 2019.  He subsequently developed some varicose veins and swelling in the right lower extremity.  He was seen by Dr. Darrick Pennafields and try compression stockings for several months which seem to help initially but he continued to have swelling so he stopped using them.  He said that over the past couple months his swelling has actually improved without using the compression stockings.  He denies any chest or back pain.  Denies shortness of breath.  Current Outpatient Medications  Medication Sig Dispense Refill  . aspirin EC 325 MG tablet Take 1 tablet (325 mg total) by mouth 2 (two) times daily. 60 tablet 1  . benazepril (LOTENSIN) 20 MG tablet Take 1 tablet by mouth once daily 90 tablet 1  . docusate sodium (COLACE) 100 MG capsule Take 1 capsule (100 mg total) by mouth 2 (two) times daily as needed for mild constipation. 40 capsule 1  . doxycycline (VIBRA-TABS) 100 MG tablet Take 1 tablet (100 mg total) by mouth 2 (two) times daily. 20 tablet 0  . ibuprofen (ADVIL,MOTRIN) 200 MG tablet Take 600 mg by mouth every 6 (six) hours as needed for headache or moderate pain.    . methocarbamol (ROBAXIN) 500 MG tablet Take 1 tablet (500 mg total) by mouth every 6 (six) hours as needed for muscle spasms. 40 tablet 1  . polyethylene glycol (MIRALAX) packet Take 17 g by mouth daily. 14 each 1  . rosuvastatin (CRESTOR) 20 MG tablet Take 1 tablet by mouth once daily 90 tablet 0  . sulfamethoxazole-trimethoprim (BACTRIM DS,SEPTRA DS) 800-160 MG tablet 1/2 tab po qd     No current facility-administered medications for this visit.      Physical Exam: BP (!) 150/85   Pulse 74   Temp 97.7 F (36.5 C) (Skin)   Resp 20   Ht 5'  9" (1.753 m)   Wt 230 lb (104.3 kg)   SpO2 95% Comment: RA  BMI 33.97 kg/m  He looks well. Cardiac exam shows regular rate and rhythm with normal heart sounds.  There is no murmur. Lungs are clear. There is minimal edema in the right ankle.  Diagnostic Tests:  CLINICAL DATA:  Thoracic aortic aneurysm without rupture. Pulmonary nodules.  EXAM: CT CHEST WITHOUT CONTRAST  TECHNIQUE: Multidetector CT imaging of the chest was performed following the standard protocol without IV contrast.  COMPARISON:  CT scan of May 26, 2017.  FINDINGS: Cardiovascular: Grossly stable 4.1 cm ascending thoracic aortic aneurysm is noted. Normal cardiac size. No pericardial effusion is noted. Atherosclerosis of thoracic aorta is noted. Coronary artery calcifications are noted as well.  Mediastinum/Nodes: No enlarged mediastinal or axillary lymph nodes. Thyroid gland, trachea, and esophagus demonstrate no significant findings.  Lungs/Pleura: No pneumothorax or pleural effusion is noted. Stable 7 mm nodule is noted in right lateral costophrenic sulcus. Stable 6 mm subpleural nodule is noted in left lower lobe. Stable 5 mm subpleural nodule is noted in left lower lobe laterally. Stable 8 mm nodule is noted in the lateral portion of the right lower lobe. Multiple other smaller nodules are noted all of which are stable compared to prior exam. No consolidative process is noted.  Upper  Abdomen: No acute abnormality.  Musculoskeletal: No chest wall mass or suspicious bone lesions identified.  IMPRESSION: Grossly stable 4.1 cm ascending thoracic aortic aneurysm. Recommend annual imaging followup by CTA or MRA. This recommendation follows 2010 ACCF/AHA/AATS/ACR/ASA/SCA/SCAI/SIR/STS/SVM Guidelines for the Diagnosis and Management of Patients with Thoracic Aortic Disease. Circulation. 2010; 121: K240-X735. Aortic aneurysm NOS (ICD10-I71.9).  Stable bilateral pulmonary nodules are noted.  These can be considered benign at this point with no further follow-up required.  Coronary artery calcifications are noted.  Aortic Atherosclerosis (ICD10-I70.0).   Electronically Signed   By: Lupita Raider M.D.   On: 06/01/2018 09:55   Impression:  He has a stable 4.1 cm fusiform ascending aortic aneurysm which is not changed since it was diagnosed on CT scan in October 2017.  I reviewed the CT scan images with him and answered his questions.  I stressed the importance of good blood pressure control and preventing further enlargement and aortic dissection.  This is well below the surgical threshold of 5.5 cm.  He also has multiple small stable bilateral pulmonary nodules which have not changed and are considered benign at this point.  Plan:  I will plan to see him back in 1 year with a CT scan of the chest without contrast.  I spent 15 minutes performing this established patient evaluation and > 50% of this time was spent face to face counseling and coordinating the care of this patient's aortic aneurysm.    Alleen Borne, MD [facility] [phone]

## 2018-07-22 ENCOUNTER — Other Ambulatory Visit: Payer: Self-pay | Admitting: Family Medicine

## 2018-08-17 ENCOUNTER — Other Ambulatory Visit: Payer: Self-pay | Admitting: Family Medicine

## 2018-09-20 ENCOUNTER — Other Ambulatory Visit: Payer: Self-pay | Admitting: Family Medicine

## 2018-09-23 ENCOUNTER — Encounter: Payer: Self-pay | Admitting: Family Medicine

## 2018-09-23 ENCOUNTER — Ambulatory Visit (INDEPENDENT_AMBULATORY_CARE_PROVIDER_SITE_OTHER): Payer: Medicare Other | Admitting: Family Medicine

## 2018-09-23 ENCOUNTER — Other Ambulatory Visit: Payer: Self-pay

## 2018-09-23 ENCOUNTER — Telehealth: Payer: Self-pay | Admitting: *Deleted

## 2018-09-23 VITALS — BP 110/68 | HR 78 | Temp 97.9°F | Resp 18 | Ht 69.0 in | Wt 227.0 lb

## 2018-09-23 DIAGNOSIS — Z23 Encounter for immunization: Secondary | ICD-10-CM

## 2018-09-23 DIAGNOSIS — R739 Hyperglycemia, unspecified: Secondary | ICD-10-CM | POA: Diagnosis not present

## 2018-09-23 DIAGNOSIS — E7800 Pure hypercholesterolemia, unspecified: Secondary | ICD-10-CM

## 2018-09-23 DIAGNOSIS — I10 Essential (primary) hypertension: Secondary | ICD-10-CM

## 2018-09-23 DIAGNOSIS — Z125 Encounter for screening for malignant neoplasm of prostate: Secondary | ICD-10-CM | POA: Diagnosis not present

## 2018-09-23 NOTE — Telephone Encounter (Signed)
Received verbal orders for Cologuard.   Order placed via [employer].   Cologuard (Order [id number])

## 2018-09-23 NOTE — Progress Notes (Signed)
Subjective:    Patient ID: Shane Robinson, male    DOB: 1950/04/08, 68 y.o.   MRN: [id number]  Medication Refill   Patient presents today for a follow-up of his hypertension.  His blood pressures well controlled today at 110/68.  He denies any chest pain shortness of breath or dyspnea on exertion.  He is overdue for Pneumovax 23 as well as Prevnar 13 however he declines these today.  He would like to get his flu shot.  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  We discussed Cologuard and he is interested in Cologuard.  He is also due for prostate cancer screening with a PSA.  Aside from this he is doing well with no concerns Past Medical History:  Diagnosis Date  . Allergy   . Elevated lipids   . History of kidney stones    X 2 PASSED ON OWN  . Hyperlipidemia   . Hypertension   . Lumbar pars defect   . Pulmonary nodules   . Thoracic aortic aneurysm without rupture (HCC)    STABLE 4.1 CM STABLE PER DR BATRLE 05-26-17 EPIC NOTE   Past Surgical History:  Procedure Laterality Date  . HERNIA REPAIR  1975   INGUINAL  . MENISCUS REPAIR RIGHT KNEE  2016  . SHOULDER SURGERY Right 2002   ROTATOR CUFF   . TOTAL KNEE ARTHROPLASTY Right 07/21/2017   Procedure: RIGHT TOTAL KNEE ARTHROPLASTY, PERTIAL PATELLECTOMY, REPAIR QUADRICEPS TENDON;  Surgeon: Susa Day, MD;  Location: [facility];  Service: Orthopedics;  Laterality: Right;  120 mins   Current Outpatient Medications on File Prior to Visit  Medication Sig Dispense Refill  . benazepril (LOTENSIN) 20 MG tablet Take 1 tablet by mouth once daily 90 tablet 1  . ibuprofen (ADVIL,MOTRIN) 200 MG tablet Take 600 mg by mouth every 6 (six) hours as needed for headache or moderate pain.    . rosuvastatin (CRESTOR) 20 MG tablet TAKE 1 TABLET BY MOUTH ONCE DAILY . APPOINTMENT REQUIRED FOR FUTURE REFILLS 30 tablet 0   No current facility-administered medications on  file prior to visit.    Allergies  Allergen Reactions  . Latex Hives   Social History   Socioeconomic History  . Marital status: Married    Spouse name: Not on file  . Number of children: Not on file  . Years of education: Not on file  . Highest education Robinson: Not on file  Occupational History  . Not on file  Social Needs  . Financial resource strain: Not on file  . Food insecurity    Worry: Not on file    Inability: Not on file  . Transportation needs    Medical: Not on file    Non-medical: Not on file  Tobacco Use  . Smoking status: Never Smoker  . Smokeless tobacco: Never Used  Substance and Sexual Activity  . Alcohol use: Yes    Comment: occ  . Drug use: No  . 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 Physical activity    Days per week: Not on file    Minutes per session: Not on file  . Stress: Not on file  Relationships  . Social Herbalist on phone: Not on file    Gets together: Not on file    Attends religious service: Not on  file    Active member of club or organization: Not on file    Attends meetings of clubs or organizations: Not on file    Relationship status: Not on file  . Intimate partner violence    Fear of current or ex partner: Not on file    Emotionally abused: Not on file    Physically abused: Not on file    Forced sexual activity: Not on file  Other Topics Concern  . Not on file  Social History Narrative  . Not on file      Review of Systems  All other systems reviewed and are negative.      Objective:   Physical Exam  Cardiovascular: Normal rate, regular rhythm and normal heart sounds.  Pulmonary/Chest: Effort normal and breath sounds normal. No respiratory distress. He has no wheezes. He has no rales.  Abdominal: Soft. Bowel sounds are normal. He exhibits no distension. There is no abdominal tenderness. There is no rebound.  Musculoskeletal:        General: No edema.   Neurological: He is alert. He has normal reflexes. He exhibits normal muscle tone.  Vitals reviewed.         Assessment & Plan:  Elevated blood sugar - Plan: CBC with Differential/Platelet, COMPLETE METABOLIC PANEL WITH GFR, Hemoglobin A1c  Prostate cancer screening - Plan: PSA  Pure hypercholesterolemia  Essential hypertension  Patient's blood pressures acceptable.  I will check a CBC, CMP.  While he was hospitalized last year he was found to have an elevated fasting blood sugar in the 150s.  However this was right after surgery and he was battling an infection.  Therefore I will check a hemoglobin A1c to see if this is consistent.  I will screen for prostate cancer with a PSA and I will schedule the patient for Cologuard to screen for colon cancer.  He received his flu shot today.  I recommended Pneumovax 23 and/or Prevnar 13 but he declined both of these.  He can return fasting at any time to check his cholesterol

## 2018-09-23 NOTE — Telephone Encounter (Signed)
-----   Message from Alyson Locket, [place] sent at 09/23/2018 12:10 PM EDT -----  ----- Message ----- From: Susy Frizzle, MD Sent: 09/23/2018  11:17 AM EDT To: Alyson Locket, RMA  Please schedule cologuard

## 2018-09-23 NOTE — Addendum Note (Signed)
Addended by: Shary Decamp B on: 09/23/2018 12:13 PM   Modules accepted: Orders

## 2018-09-24 LAB — CBC WITH DIFFERENTIAL/PLATELET
Absolute Monocytes: 554 cells/uL (ref 200–950)
Basophils Absolute: 59 cells/uL (ref 0–200)
Basophils Relative: 0.7 %
Eosinophils Absolute: 160 cells/uL (ref 15–500)
Eosinophils Relative: 1.9 %
HCT: 44 % (ref 38.5–50.0)
Hemoglobin: 14.7 g/dL (ref 13.2–17.1)
Lymphs Abs: 2974 cells/uL (ref 850–3900)
MCH: 30.6 pg (ref 27.0–33.0)
MCHC: 33.4 g/dL (ref 32.0–36.0)
MCV: 91.7 fL (ref 80.0–100.0)
MPV: 11.7 fL (ref 7.5–12.5)
Monocytes Relative: 6.6 %
Neutro Abs: 4654 cells/uL (ref 1500–7800)
Neutrophils Relative %: 55.4 %
Platelets: 194 10*3/uL (ref 140–400)
RBC: 4.8 10*6/uL (ref 4.20–5.80)
RDW: 12.4 % (ref 11.0–15.0)
Total Lymphocyte: 35.4 %
WBC: 8.4 10*3/uL (ref 3.8–10.8)

## 2018-09-24 LAB — COMPLETE METABOLIC PANEL WITH GFR
AG Ratio: 1.5 (calc) (ref 1.0–2.5)
ALT: 31 U/L (ref 9–46)
AST: 30 U/L (ref 10–35)
Albumin: 4.4 g/dL (ref 3.6–5.1)
Alkaline phosphatase (APISO): 47 U/L (ref 35–144)
BUN: 23 mg/dL (ref 7–25)
CO2: 23 mmol/L (ref 20–32)
Calcium: 9.3 mg/dL (ref 8.6–10.3)
Chloride: 105 mmol/L (ref 98–110)
Creat: 1.12 mg/dL (ref 0.70–1.25)
GFR, Est African American: 78 mL/min/{1.73_m2} (ref >=60)
GFR, Est Non African American: 68 mL/min/{1.73_m2} (ref >=60)
Globulin: 2.9 g/dL (calc) (ref 1.9–3.7)
Glucose, Bld: 95 mg/dL (ref 65–99)
Potassium: 4.1 mmol/L (ref 3.5–5.3)
Sodium: 140 mmol/L (ref 135–146)
Total Bilirubin: 0.6 mg/dL (ref 0.2–1.2)
Total Protein: 7.3 g/dL (ref 6.1–8.1)

## 2018-09-24 LAB — HEMOGLOBIN A1C
Hgb A1c MFr Bld: 5.7 % of total Hgb — ABNORMAL HIGH (ref <5.7)
Mean Plasma Glucose: 117 (calc)
eAG (mmol/L): 6.5 (calc)

## 2018-09-24 LAB — PSA: PSA: 0.4 ng/mL (ref <=4.0)

## 2018-10-17 ENCOUNTER — Encounter: Payer: Self-pay | Admitting: Family Medicine

## 2018-10-17 LAB — COLOGUARD

## 2018-10-18 ENCOUNTER — Other Ambulatory Visit: Payer: Self-pay | Admitting: Family Medicine

## 2018-10-25 NOTE — Telephone Encounter (Signed)
Received the results of Cologuard screening.   Screening noted negative.   A negative result indicates a low likelihood of colorectal cancer is present. Following a negative Cologuard result, the American Cancer Society recommends a Cologuard re-screening interval of 3 years.   Letter sent.   

## 2018-11-15 ENCOUNTER — Other Ambulatory Visit: Payer: Self-pay | Admitting: Family Medicine

## 2019-01-17 DIAGNOSIS — M25512 Pain in left shoulder: Secondary | ICD-10-CM | POA: Diagnosis not present

## 2019-01-25 ENCOUNTER — Other Ambulatory Visit: Payer: Medicare PPO

## 2019-01-28 ENCOUNTER — Ambulatory Visit: Payer: Medicare Other

## 2019-01-28 ENCOUNTER — Ambulatory Visit: Payer: Medicare PPO | Attending: Internal Medicine

## 2019-01-28 DIAGNOSIS — Z23 Encounter for immunization: Secondary | ICD-10-CM | POA: Insufficient documentation

## 2019-01-28 NOTE — Progress Notes (Signed)
   Covid-19 Vaccination Clinic  Name:  Shane Robinson    MRN: [id number] DOB: 18-Jan-1950  01/28/2019  Mr. Shane Robinson was observed post Covid-19 immunization for 15 minutes without incidence. He was provided with Vaccine Information Sheet and instruction to access the V-Safe system.   Mr. Shane Robinson was instructed to call 911 with any severe reactions post vaccine: Marland Kitchen Difficulty breathing  . Swelling of your face and throat  . A fast heartbeat  . A bad rash all over your body  . Dizziness and weakness    Immunizations Administered    Name Date Dose VIS Date Route   Pfizer COVID-19 Vaccine 01/28/2019  1:01 PM 0.3 mL 12/16/2018 Intramuscular   Manufacturer: [employer], [employer]   Lot: [id number]   NDC: [address]-3

## 2019-02-13 ENCOUNTER — Other Ambulatory Visit: Payer: Self-pay | Admitting: Family Medicine

## 2019-02-18 ENCOUNTER — Ambulatory Visit: Payer: Medicare PPO | Attending: Internal Medicine

## 2019-02-18 DIAGNOSIS — Z23 Encounter for immunization: Secondary | ICD-10-CM | POA: Insufficient documentation

## 2019-02-18 NOTE — Progress Notes (Signed)
   Covid-19 Vaccination Clinic  Name:  Shane Robinson    MRN: [id number] DOB: 1950-10-19  02/18/2019  Shane Robinson was observed post Covid-19 immunization for 15 minutes without incidence. He was provided with Vaccine Information Sheet and instruction to access the V-Safe system.   Shane Robinson was instructed to call 911 with any severe reactions post vaccine: Marland Kitchen Difficulty breathing  . Swelling of your face and throat  . A fast heartbeat  . A bad rash all over your body  . Dizziness and weakness    Immunizations Administered    Name Date Dose VIS Date Route   Pfizer COVID-19 Vaccine 02/18/2019 11:41 AM 0.3 mL 12/16/2018 Intramuscular   Manufacturer: [employer], [employer]   Lot: [id number]   NDC: [address]-5

## 2019-03-23 ENCOUNTER — Other Ambulatory Visit: Payer: Self-pay | Admitting: Family Medicine

## 2019-03-23 MED ORDER — ROSUVASTATIN CALCIUM 20 MG PO TABS: ORAL_TABLET | ORAL | 5 refills | Status: DC

## 2019-04-17 ENCOUNTER — Other Ambulatory Visit: Payer: Self-pay | Admitting: Surgery

## 2019-04-17 DIAGNOSIS — R918 Other nonspecific abnormal finding of lung field: Secondary | ICD-10-CM

## 2019-05-30 ENCOUNTER — Other Ambulatory Visit: Payer: Self-pay | Admitting: Surgery

## 2019-05-31 ENCOUNTER — Encounter: Payer: Self-pay | Admitting: Surgery

## 2019-05-31 ENCOUNTER — Other Ambulatory Visit: Payer: Self-pay

## 2019-05-31 ENCOUNTER — Ambulatory Visit: Payer: Medicare PPO | Admitting: Surgery

## 2019-05-31 ENCOUNTER — Ambulatory Visit
Admission: RE | Admit: 2019-05-31 | Discharge: 2019-05-31 | Disposition: A | Discharge status: Home / Self Care | Payer: Medicare PPO | Source: Ambulatory Visit | Attending: Surgery | Admitting: Surgery

## 2019-05-31 VITALS — BP 135/80 | HR 70 | Temp 98.1°F | Resp 20 | Ht 69.0 in | Wt 230.0 lb

## 2019-05-31 DIAGNOSIS — R911 Solitary pulmonary nodule: Secondary | ICD-10-CM | POA: Diagnosis not present

## 2019-05-31 DIAGNOSIS — I712 Thoracic aortic aneurysm, without rupture, unspecified: Secondary | ICD-10-CM

## 2019-05-31 DIAGNOSIS — R918 Other nonspecific abnormal finding of lung field: Secondary | ICD-10-CM

## 2019-05-31 NOTE — Progress Notes (Signed)
HPI:  The patient is a 69 year old gentleman who returns today for follow-up of a stable 4.1 cm fusiform ascending aortic aneurysm that has not changed since it was first diagnosed on CT scan in October 2017.  He continues to feel well without chest or back pain.  Current Outpatient Medications  Medication Sig Dispense Refill  . benazepril (LOTENSIN) 20 MG tablet Take 1 tablet by mouth once daily 90 tablet 2  . ibuprofen (ADVIL,MOTRIN) 200 MG tablet Take 600 mg by mouth every 6 (six) hours as needed for headache or moderate pain.    . rosuvastatin (CRESTOR) 20 MG tablet TAKE 1 TABLET BY MOUTH ONCE DAILY . 30 tablet 5   No current facility-administered medications for this visit.     Physical Exam: BP 135/80   Pulse 70   Temp 98.1 F (36.7 C) (Skin)   Resp 20   Ht 5\' 9"  (1.753 m)   Wt 230 lb (104.3 kg)   SpO2 96% Comment: RA  BMI 33.97 kg/m  He looks well. Cardiac exam shows a regular rate and rhythm with normal heart sounds.  There is no murmur.  Diagnostic Tests:  CLINICAL DATA:  Follow-up of pulmonary nodules and thoracic aortic aneurysm.  EXAM: CT CHEST WITHOUT CONTRAST  TECHNIQUE: Multidetector CT imaging of the chest was performed following the standard protocol without IV contrast.  COMPARISON:  06/01/2018  FINDINGS: Cardiovascular: Bovine arch. Aortic atherosclerosis. Ascending aorta measures maximally 3.5 cm on coronal image 66, similar. Based on transverse images, maximally 3.7 cm on 66/2 within the mid ascending segment, similar. Mild cardiomegaly. Multivessel coronary artery atherosclerosis.  Mediastinum/Nodes: No supraclavicular adenopathy. No mediastinal or definite hilar adenopathy, given limitations of unenhanced CT.  Lungs/Pleura: No pleural fluid. Similar size and distribution of bilateral pulmonary nodules, likely subpleural lymph nodes. These are again presumed benign. Example at the right lung base measuring 7 mm on  119/8.  Upper Abdomen: Normal imaged portions of the liver, spleen, stomach, pancreas, gallbladder, adrenal glands, kidneys.  Musculoskeletal: Lucency through the left scapular spine including on 17/2, new. Lower cervical and lower thoracic spondylosis.  IMPRESSION: 1. Similar bilateral pulmonary nodules, which can be presumed benign. Likely subpleural lymph nodes. 2. Similar ascending aortic size, maximally 3.7 cm. No aneurysmal dilatation. 3. Coronary artery atherosclerosis. Aortic Atherosclerosis (ICD10-I70.0). 4. New lucency through the left scapular spine. Correlate with interval trauma, as nondisplaced fracture is a concern.   Electronically Signed   By: 03-11-2001 M.D.   On: 05/31/2019 14:52   Impression:  He has a stable fusiform ascending aortic aneurysm that was measured slightly smaller today at 3.7 cm compared to previous measurements which have all been at 4.1 cm.  I suspect this is a slight measurement error since his scans have been done without contrast.  This is still well below the surgical threshold of 5.5 cm.  His blood pressure is under good control and I stressed the importance of continued good blood pressure control and preventing further enlargement and acute aortic dissection.  The previously seen bilateral subpleural nodules are unchanged and likely subpleural lymph nodes.  There is a new lucency through the left scapular spine of unclear significance.  He does remember having some pain in this area last year but nothing recent.  I reviewed the CTA images with him and answered his questions.  Since he has a small aneurysm and has been stable for several years I think it is reasonable to wait 2 years to repeat his  scan.  Plan:  I will see him back in 2 years with a CT of the chest without contrast.   I spent 20 minutes performing this established patient evaluation and > 50% of this time was spent face to face counseling and coordinating the care of  this patient's aortic aneurysm and bilateral pulmonary nodules.    Gaye Pollack, MD [facility] [phone]

## 2019-09-19 ENCOUNTER — Other Ambulatory Visit: Payer: Self-pay | Admitting: Family Medicine

## 2019-09-21 ENCOUNTER — Other Ambulatory Visit: Payer: Self-pay | Admitting: Family Medicine

## 2019-09-21 MED ORDER — BENAZEPRIL HCL 20 MG PO TABS: 20.0000 mg | ORAL_TABLET | Freq: Every day | ORAL | 0 refills | Status: DC

## 2019-09-21 NOTE — Telephone Encounter (Signed)
CB# Medication was denied need to schedule appt. Appointment schedule on 09-26-19 need refill Lotensin until his appt

## 2019-09-26 ENCOUNTER — Ambulatory Visit (INDEPENDENT_AMBULATORY_CARE_PROVIDER_SITE_OTHER): Payer: Medicare PPO | Admitting: Family Medicine

## 2019-09-26 ENCOUNTER — Other Ambulatory Visit: Payer: Self-pay

## 2019-09-26 VITALS — BP 116/78 | HR 62 | Temp 97.5°F | Ht 69.0 in | Wt 230.0 lb

## 2019-09-26 DIAGNOSIS — Z23 Encounter for immunization: Secondary | ICD-10-CM | POA: Diagnosis not present

## 2019-09-26 DIAGNOSIS — I10 Essential (primary) hypertension: Secondary | ICD-10-CM | POA: Diagnosis not present

## 2019-09-26 DIAGNOSIS — Z125 Encounter for screening for malignant neoplasm of prostate: Secondary | ICD-10-CM

## 2019-09-26 DIAGNOSIS — E7800 Pure hypercholesterolemia, unspecified: Secondary | ICD-10-CM

## 2019-09-26 NOTE — Progress Notes (Signed)
Subjective:    Patient ID: Shane Robinson, male    DOB: [birth year], 69 y.o.   MRN: [id number]  Patient is here today for a follow-up of his blood pressure.  Blood pressures well controlled at 116/78.  He denies any chest pain shortness of breath or dyspnea on exertion.  He is overdue for a pneumonia shot but he politely declines at today.  He is due for a flu shot.  He would like to receive that today.  He is also due for a booster on his Covid shot in October.  I have recommended that he get this.  He reports generalized myalgias and arthralgias all over his body.  He is on Crestor 20 mg a day.  He has not stopped the Crestor yet to see if the muscle aches could be due to this.  Cologuard last year was negative.  He is due for a PSA to recheck for prostate cancer. Past Medical History:  Diagnosis Date  . Allergy   . Elevated lipids   . History of kidney stones    X 2 PASSED ON OWN  . Hyperlipidemia   . Hypertension   . Lumbar pars defect   . Pulmonary nodules   . Thoracic aortic aneurysm without rupture (HCC)    STABLE 4.1 CM STABLE PER DR BATRLE 05-26-17 EPIC NOTE   Past Surgical History:  Procedure Laterality Date  . HERNIA REPAIR  1975   INGUINAL  . MENISCUS REPAIR RIGHT KNEE  2016  . SHOULDER SURGERY Right 2002   ROTATOR CUFF   . TOTAL KNEE ARTHROPLASTY Right 07/21/2017   Procedure: RIGHT TOTAL KNEE ARTHROPLASTY, PERTIAL PATELLECTOMY, REPAIR QUADRICEPS TENDON;  Surgeon: Jene Every, MD;  Location: [facility];  Service: Orthopedics;  Laterality: Right;  120 mins   Current Outpatient Medications on File Prior to Visit  Medication Sig Dispense Refill  . benazepril (LOTENSIN) 20 MG tablet Take 1 tablet (20 mg total) by mouth daily. 30 tablet 0  . ibuprofen (ADVIL,MOTRIN) 200 MG tablet Take 600 mg by mouth every 6 (six) hours as needed for headache or moderate pain.    . rosuvastatin (CRESTOR) 20 MG tablet TAKE 1 TABLET BY MOUTH ONCE DAILY . 30 tablet 5   No current  facility-administered medications on file prior to visit.   Allergies  Allergen Reactions  . Latex Hives   Social History   Socioeconomic History  . Marital status: Married    Spouse name: Not on file  . Number of children: Not on file  . Years of education: Not on file  . Highest education level: Not on file  Occupational History  . Not on file  Tobacco Use  . Smoking status: Never Smoker  . Smokeless tobacco: Never Used  Vaping Use  . Vaping Use: Never used  Substance and Sexual Activity  . Alcohol use: Yes    Comment: occ  . Drug use: No  . Sexual activity: Not on file    Comment: married, dairy farmer  Other Topics Concern  . Not on file  Social History Narrative  . Not on file   Social Determinants of Health   Financial Resource Strain:   . Difficulty of Paying Living Expenses: Not on file  Food Insecurity:   . Worried About Programme researcher, broadcasting/film/video in the Last Year: Not on file  . Ran Out of Food in the Last Year: Not on file  Transportation Needs:   . Lack of Transportation (Medical): Not on  file  . Lack of Transportation (Non-Medical): Not on file  Physical Activity:   . Days of Exercise per Week: Not on file  . Minutes of Exercise per Session: Not on file  Stress:   . Feeling of Stress : Not on file  Social Connections:   . Frequency of Communication with Friends and Family: Not on file  . Frequency of Social Gatherings with Friends and Family: Not on file  . Attends Religious Services: Not on file  . Active Member of Clubs or Organizations: Not on file  . Attends Banker Meetings: Not on file  . Marital Status: Not on file  Intimate Partner Violence:   . Fear of Current or Ex-Partner: Not on file  . Emotionally Abused: Not on file  . Physically Abused: Not on file  . Sexually Abused: Not on file      Review of Systems  All other systems reviewed and are negative.      Objective:   Physical Exam Vitals reviewed.  Cardiovascular:       Rate and Rhythm: Normal rate and regular rhythm.     Heart sounds: Normal heart sounds.  Pulmonary:     Effort: Pulmonary effort is normal. No respiratory distress.     Breath sounds: Normal breath sounds. No wheezing or rales.  Abdominal:     General: Bowel sounds are normal. There is no distension.     Palpations: Abdomen is soft.     Tenderness: There is no abdominal tenderness. There is no rebound.  Neurological:     Mental Status: He is alert.     Motor: No abnormal muscle tone.     Deep Tendon Reflexes: Reflexes are normal and symmetric.           Assessment & Plan:  Need for immunization against influenza - Plan: Flu Vaccine QUAD 36+ mos IM  Prostate cancer screening - Plan: PSA  Pure hypercholesterolemia - Plan: CBC with Differential/Platelet, COMPLETE METABOLIC PANEL WITH GFR, Lipid panel  Essential hypertension - Plan: CBC with Differential/Platelet, COMPLETE METABOLIC PANEL WITH GFR, Lipid panel  Given the diffuse myalgias and arthralgias, I have recommended the patient temporarily discontinue Crestor for 2 to 3 weeks to see if symptoms improve.  If not, I suspect that the muscle aches are more likely due to degenerative joint disease however I would recommend obtaining a sed rate, rheumatoid factor, CK level, and ANA if the muscle aches continue despite stopping Crestor.  Blood pressure today is well controlled.  Return fasting for CBC, CMP, fasting lipid panel.  Goal LDL cholesterol is less than 100.  Check PSA to screen for prostate cancer.  Patient received his flu shot.  Recommended Pneumovax 23 but patient declined.  Recommended a booster on his Covid vaccination

## 2019-09-27 ENCOUNTER — Other Ambulatory Visit: Payer: Medicare PPO

## 2019-09-27 DIAGNOSIS — I10 Essential (primary) hypertension: Secondary | ICD-10-CM | POA: Diagnosis not present

## 2019-09-27 DIAGNOSIS — Z125 Encounter for screening for malignant neoplasm of prostate: Secondary | ICD-10-CM | POA: Diagnosis not present

## 2019-09-27 DIAGNOSIS — E7800 Pure hypercholesterolemia, unspecified: Secondary | ICD-10-CM | POA: Diagnosis not present

## 2019-09-28 LAB — CBC WITH DIFFERENTIAL/PLATELET
Absolute Monocytes: 748 cells/uL (ref 200–950)
Basophils Absolute: 50 cells/uL (ref 0–200)
Basophils Relative: 0.6 %
Eosinophils Absolute: 151 cells/uL (ref 15–500)
Eosinophils Relative: 1.8 %
HCT: 43.8 % (ref 38.5–50.0)
Hemoglobin: 14.9 g/dL (ref 13.2–17.1)
Lymphs Abs: 1957 cells/uL (ref 850–3900)
MCH: 31.6 pg (ref 27.0–33.0)
MCHC: 34 g/dL (ref 32.0–36.0)
MCV: 93 fL (ref 80.0–100.0)
MPV: 11.7 fL (ref 7.5–12.5)
Monocytes Relative: 8.9 %
Neutro Abs: 5494 cells/uL (ref 1500–7800)
Neutrophils Relative %: 65.4 %
Platelets: 183 10*3/uL (ref 140–400)
RBC: 4.71 10*6/uL (ref 4.20–5.80)
RDW: 12.3 % (ref 11.0–15.0)
Total Lymphocyte: 23.3 %
WBC: 8.4 10*3/uL (ref 3.8–10.8)

## 2019-09-28 LAB — COMPLETE METABOLIC PANEL WITH GFR
AG Ratio: 1.5 (calc) (ref 1.0–2.5)
ALT: 26 U/L (ref 9–46)
AST: 25 U/L (ref 10–35)
Albumin: 4.1 g/dL (ref 3.6–5.1)
Alkaline phosphatase (APISO): 44 U/L (ref 35–144)
BUN: 24 mg/dL (ref 7–25)
CO2: 26 mmol/L (ref 20–32)
Calcium: 9 mg/dL (ref 8.6–10.3)
Chloride: 106 mmol/L (ref 98–110)
Creat: 0.96 mg/dL (ref 0.70–1.25)
GFR, Est African American: 94 mL/min/{1.73_m2} (ref >=60)
GFR, Est Non African American: 81 mL/min/{1.73_m2} (ref >=60)
Globulin: 2.8 g/dL (calc) (ref 1.9–3.7)
Glucose, Bld: 85 mg/dL (ref 65–99)
Potassium: 4.7 mmol/L (ref 3.5–5.3)
Sodium: 138 mmol/L (ref 135–146)
Total Bilirubin: 0.8 mg/dL (ref 0.2–1.2)
Total Protein: 6.9 g/dL (ref 6.1–8.1)

## 2019-09-28 LAB — LIPID PANEL
Cholesterol: 92 mg/dL (ref <200)
HDL: 37 mg/dL — ABNORMAL LOW (ref >=40)
LDL Cholesterol (Calc): 39 mg/dL (calc)
Non-HDL Cholesterol (Calc): 55 mg/dL (calc) (ref <130)
Total CHOL/HDL Ratio: 2.5 (calc) (ref <5.0)
Triglycerides: 84 mg/dL (ref <150)

## 2019-09-28 LAB — PSA: PSA: 0.31 ng/mL (ref <=4.0)

## 2019-11-02 DIAGNOSIS — L820 Inflamed seborrheic keratosis: Secondary | ICD-10-CM | POA: Diagnosis not present

## 2019-11-02 DIAGNOSIS — Z85828 Personal history of other malignant neoplasm of skin: Secondary | ICD-10-CM | POA: Diagnosis not present

## 2019-11-02 DIAGNOSIS — L814 Other melanin hyperpigmentation: Secondary | ICD-10-CM | POA: Diagnosis not present

## 2019-11-02 DIAGNOSIS — D1801 Hemangioma of skin and subcutaneous tissue: Secondary | ICD-10-CM | POA: Diagnosis not present

## 2019-11-02 DIAGNOSIS — L821 Other seborrheic keratosis: Secondary | ICD-10-CM | POA: Diagnosis not present

## 2019-11-02 DIAGNOSIS — D2262 Melanocytic nevi of left upper limb, including shoulder: Secondary | ICD-10-CM | POA: Diagnosis not present

## 2019-11-02 DIAGNOSIS — L570 Actinic keratosis: Secondary | ICD-10-CM | POA: Diagnosis not present

## 2019-11-02 DIAGNOSIS — D225 Melanocytic nevi of trunk: Secondary | ICD-10-CM | POA: Diagnosis not present

## 2019-11-21 ENCOUNTER — Other Ambulatory Visit: Payer: Self-pay | Admitting: Family Medicine

## 2019-12-26 ENCOUNTER — Other Ambulatory Visit: Payer: Self-pay | Admitting: Family Medicine

## 2020-01-25 ENCOUNTER — Other Ambulatory Visit: Payer: Self-pay | Admitting: Family Medicine

## 2020-02-22 ENCOUNTER — Other Ambulatory Visit: Payer: Self-pay | Admitting: Family Medicine

## 2020-03-22 DIAGNOSIS — M48061 Spinal stenosis, lumbar region without neurogenic claudication: Secondary | ICD-10-CM | POA: Diagnosis not present

## 2020-03-22 DIAGNOSIS — Z96651 Presence of right artificial knee joint: Secondary | ICD-10-CM | POA: Diagnosis not present

## 2020-03-22 DIAGNOSIS — M5459 Other low back pain: Secondary | ICD-10-CM | POA: Diagnosis not present

## 2020-03-22 DIAGNOSIS — M5136 Other intervertebral disc degeneration, lumbar region: Secondary | ICD-10-CM | POA: Diagnosis not present

## 2020-03-27 ENCOUNTER — Other Ambulatory Visit: Payer: Self-pay | Admitting: Family Medicine

## 2020-04-10 ENCOUNTER — Other Ambulatory Visit: Payer: Self-pay | Admitting: *Deleted

## 2020-04-10 DIAGNOSIS — I712 Thoracic aortic aneurysm, without rupture, unspecified: Secondary | ICD-10-CM

## 2020-05-29 ENCOUNTER — Other Ambulatory Visit: Payer: Self-pay

## 2020-05-29 ENCOUNTER — Ambulatory Visit
Admission: RE | Admit: 2020-05-29 | Discharge: 2020-05-29 | Disposition: A | Discharge status: Home / Self Care | Payer: Medicare PPO | Source: Ambulatory Visit | Attending: Surgery | Admitting: Surgery

## 2020-05-29 ENCOUNTER — Encounter: Payer: Self-pay | Admitting: Surgery

## 2020-05-29 ENCOUNTER — Ambulatory Visit: Payer: Medicare PPO | Admitting: Surgery

## 2020-05-29 VITALS — BP 135/77 | HR 60 | Resp 20 | Ht 69.0 in | Wt 230.0 lb

## 2020-05-29 DIAGNOSIS — I2510 Atherosclerotic heart disease of native coronary artery without angina pectoris: Secondary | ICD-10-CM | POA: Diagnosis not present

## 2020-05-29 DIAGNOSIS — I712 Thoracic aortic aneurysm, without rupture, unspecified: Secondary | ICD-10-CM

## 2020-05-29 DIAGNOSIS — R918 Other nonspecific abnormal finding of lung field: Secondary | ICD-10-CM | POA: Diagnosis not present

## 2020-05-29 MED ORDER — IOPAMIDOL (ISOVUE-370) INJECTION 76%
75.0000 mL | Freq: Once | INTRAVENOUS | Status: AC | PRN
  Administered 2020-05-29: 75 mL via INTRAVENOUS

## 2020-05-29 NOTE — Progress Notes (Signed)
HPI:  The patient is a 70 year old gentleman who returns for follow-up of a small ascending aortic aneurysm which is varied in measurement on serial studies from 3.7 cm to 4.1 cm.  He continues to feel well without chest pain or shortness of breath.  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  His wife is with him in the office today.  Current Outpatient Medications  Medication Sig Dispense Refill  . benazepril (LOTENSIN) 20 MG tablet Take 1 tablet by mouth once daily 30 tablet 3  . ibuprofen (ADVIL,MOTRIN) 200 MG tablet Take 600 mg by mouth every 6 (six) hours as needed for headache or moderate pain.    . rosuvastatin (CRESTOR) 20 MG tablet Take 1 tablet by mouth once daily 90 tablet 3   No current facility-administered medications for this visit.     Physical Exam: BP 135/77   Pulse 60   Resp 20   Ht 5\' 9"  (1.753 m)   Wt 230 lb (104.3 kg)   SpO2 96% Comment: RA  BMI 33.97 kg/m  He looks well. Cardiac exam shows regular rate and rhythm with normal heart sounds.  There is no murmur. Lungs are clear.  Diagnostic Tests:  Narrative & Impression  CLINICAL DATA:  Thoracic aortic aneurysm follow-up  EXAM: CT ANGIOGRAPHY CHEST WITH CONTRAST  TECHNIQUE: Multidetector CT imaging of the chest was performed using the standard protocol during bolus administration of intravenous contrast. Multiplanar CT image reconstructions and MIPs were obtained to evaluate the vascular anatomy.  CONTRAST:  68mL ISOVUE-370 IOPAMIDOL (ISOVUE-370) INJECTION 76%  COMPARISON:  Prior CT scan of the chest 05/31/2019  FINDINGS: Cardiovascular: Mildly ectatic ascending thoracic aorta with a maximal diameter of 3.7 cm. This does not constitute aneurysm. The aortic root is normal in caliber. The transverse and descending thoracic aorta are also normal in caliber. 2 vessel arch anatomy. The right brachiocephalic and left common carotid artery share a common origin. Normal caliber main pulmonary  artery. Extensive calcifications throughout the coronary arteries. The heart remains normal in size. No pericardial effusion.  Mediastinum/Nodes: Unremarkable CT appearance of the thyroid gland. No suspicious mediastinal or hilar adenopathy. No soft tissue mediastinal mass. The thoracic esophagus is unremarkable.  Lungs/Pleura: No significant interval change in the size, number or configuration of multiple small bilateral pulmonary nodules dating back to prior imaging from May of 2021. The lungs are otherwise clear.  Upper Abdomen: No acute abnormality.  Musculoskeletal: No acute fracture or aggressive appearing lytic or blastic osseous lesion.  Review of the MIP images confirms the above findings.  IMPRESSION: 1. Stable mild ectasia of the ascending thoracic aorta without aneurysm. 2. Stable benign bilateral pulmonary nodules. 3. Extensive coronary artery calcifications. Please note that although the presence of coronary artery calcium documents the presence of coronary artery disease, the severity of this disease and any potential stenosis cannot be assessed on this non-gated CT examination. Assessment for potential risk factor modification, dietary therapy or pharmacologic therapy may be warranted, if clinically indicated.   Electronically Signed   By: 03-04-2002 M.D.   On: 05/29/2020 15:15      Impression:  His ascending aorta was measured at 3.7 which is unchanged from a year ago.  Previous studies have shown measurements up to 4.1 cm.  His descending thoracic aorta measures about 2.3 cm distally.  This is technically not an aneurysm but ectasia and appears stable.  I reviewed the CTA images with him and answered his questions.  I  stressed the importance of continued good blood pressure control in preventing further enlargement and acute aortic dissection.  His CT scan does show extensive coronary calcifications but he denies any symptoms of coronary  ischemia.  I reviewed these again with him and asked him to contact his primary physician immediately if he develops any exertional chest, neck or arm discomfort, shortness of breath, or fatigue.  I think it is reasonable to wait a couple years before repeating his scan.  Plan:  He will return to see me in 2 years with a CTA of the chest.  I spent 20 minutes performing this established patient evaluation and > 50% of this time was spent face to face counseling and coordinating the care of this patient's aortic aneurysm.    Alleen Borne, MD [facility] [phone]

## 2020-07-28 ENCOUNTER — Other Ambulatory Visit: Payer: Self-pay | Admitting: Family Medicine

## 2020-08-26 ENCOUNTER — Other Ambulatory Visit: Payer: Self-pay | Admitting: Family Medicine

## 2020-09-30 ENCOUNTER — Other Ambulatory Visit: Payer: Self-pay | Admitting: Family Medicine

## 2020-10-21 DIAGNOSIS — L821 Other seborrheic keratosis: Secondary | ICD-10-CM | POA: Diagnosis not present

## 2020-10-21 DIAGNOSIS — L570 Actinic keratosis: Secondary | ICD-10-CM | POA: Diagnosis not present

## 2020-10-21 DIAGNOSIS — D485 Neoplasm of uncertain behavior of skin: Secondary | ICD-10-CM | POA: Diagnosis not present

## 2020-10-21 DIAGNOSIS — D1801 Hemangioma of skin and subcutaneous tissue: Secondary | ICD-10-CM | POA: Diagnosis not present

## 2020-10-21 DIAGNOSIS — Z85828 Personal history of other malignant neoplasm of skin: Secondary | ICD-10-CM | POA: Diagnosis not present

## 2020-11-01 ENCOUNTER — Other Ambulatory Visit: Payer: Self-pay | Admitting: Family Medicine

## 2020-12-02 ENCOUNTER — Other Ambulatory Visit: Payer: Self-pay | Admitting: Family Medicine

## 2020-12-03 ENCOUNTER — Other Ambulatory Visit: Payer: Self-pay | Admitting: Family Medicine

## 2020-12-24 ENCOUNTER — Ambulatory Visit (INDEPENDENT_AMBULATORY_CARE_PROVIDER_SITE_OTHER): Payer: Medicare PPO

## 2020-12-24 ENCOUNTER — Other Ambulatory Visit: Payer: Self-pay

## 2020-12-24 DIAGNOSIS — Z23 Encounter for immunization: Secondary | ICD-10-CM | POA: Diagnosis not present

## 2021-01-02 ENCOUNTER — Other Ambulatory Visit: Payer: Self-pay | Admitting: Family Medicine

## 2021-01-08 ENCOUNTER — Other Ambulatory Visit: Payer: Self-pay | Admitting: Family Medicine

## 2021-01-08 ENCOUNTER — Telehealth: Payer: Self-pay

## 2021-01-08 NOTE — Telephone Encounter (Signed)
Please schedule appointment.

## 2021-01-08 NOTE — Telephone Encounter (Signed)
Pt called in to see if needed to get a prior auth for this med benazepril (LOTENSIN) 20 MG tablet . Pt would like to know if he needs to be seen to get this med refilled as well. Please advise.  Cb#: [phone]

## 2021-01-09 ENCOUNTER — Telehealth: Payer: Self-pay

## 2021-01-09 MED ORDER — BENAZEPRIL HCL 20 MG PO TABS: ORAL_TABLET | ORAL | 0 refills | Status: DC

## 2021-01-09 NOTE — Telephone Encounter (Signed)
Pt's spouse called in inquiring about getting a refill of benazepril (LOTENSIN) 20 MG tablet. Pt is aware that he needs to be seen and has scheduled an appt with pcp for 01/14/21. Pt would like to know if prescription can be sent to pharmacy pending his appt for next week. Please advise.  Cb#: [phone]

## 2021-01-09 NOTE — Telephone Encounter (Signed)
Benazepril refill sent patient aware.

## 2021-01-14 ENCOUNTER — Ambulatory Visit: Payer: Medicare PPO | Admitting: Family Medicine

## 2021-01-14 ENCOUNTER — Other Ambulatory Visit: Payer: Self-pay

## 2021-01-14 ENCOUNTER — Encounter: Payer: Self-pay | Admitting: Family Medicine

## 2021-01-14 VITALS — BP 132/82 | HR 70 | Temp 97.9°F | Resp 18 | Ht 69.0 in | Wt 230.0 lb

## 2021-01-14 DIAGNOSIS — Z23 Encounter for immunization: Secondary | ICD-10-CM | POA: Diagnosis not present

## 2021-01-14 DIAGNOSIS — E7800 Pure hypercholesterolemia, unspecified: Secondary | ICD-10-CM | POA: Diagnosis not present

## 2021-01-14 DIAGNOSIS — I10 Essential (primary) hypertension: Secondary | ICD-10-CM

## 2021-01-14 DIAGNOSIS — Z125 Encounter for screening for malignant neoplasm of prostate: Secondary | ICD-10-CM | POA: Diagnosis not present

## 2021-01-14 NOTE — Progress Notes (Signed)
Subjective:    Patient ID: Shane Robinson, male    DOB: 03/27/1950, 71 y.o.   MRN: [id number]  Patient has CT angiogram of the chest in May 2022 to monitor his aortic aneurysm.  There was a coincidental finding of extensive coronary artery calcifications.  I discussed this today with patient.  He denies any chest pain or shortness of breath or dyspnea on exertion.  He denies any fatigue.  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  He is very physically active and has had no dyspnea on exertion or anginal symptoms.  His blood pressure today is well controlled at 132/82.  However he is overdue to check his prostate and check his cholesterol. Past Medical History:  Diagnosis Date   Allergy    Elevated lipids    History of kidney stones    X 2 PASSED ON OWN   Hyperlipidemia    Hypertension    Lumbar pars defect    Pulmonary nodules    Thoracic aortic aneurysm without rupture    STABLE 4.1 CM STABLE PER DR BATRLE 05-26-17 EPIC NOTE   Past Surgical History:  Procedure Laterality Date   HERNIA REPAIR  1975   INGUINAL   MENISCUS REPAIR RIGHT KNEE  2016   SHOULDER SURGERY Right 2002   ROTATOR CUFF    TOTAL KNEE ARTHROPLASTY Right 07/21/2017   Procedure: RIGHT TOTAL KNEE ARTHROPLASTY, PERTIAL PATELLECTOMY, REPAIR QUADRICEPS TENDON;  Surgeon: Jene Every, MD;  Location: [facility];  Service: Orthopedics;  Laterality: Right;  120 mins   Current Outpatient Medications on File Prior to Visit  Medication Sig Dispense Refill   benazepril (LOTENSIN) 20 MG tablet TAKE 1 TABLET BY MOUTH ONCE DAILY . APPOINTMENT REQUIRED FOR FUTURE REFILLS 15 tablet 0   ibuprofen (ADVIL,MOTRIN) 200 MG tablet Take 600 mg by mouth every 6 (six) hours as needed for headache or moderate pain.     rosuvastatin (CRESTOR) 20 MG tablet TAKE 1 TABLET BY MOUTH ONCE DAILY . APPOINTMENT REQUIRED FOR FUTURE REFILLS 30 tablet 0   No current facility-administered medications on file prior to visit.   Allergies  Allergen  Reactions   Latex Hives   Social History   Socioeconomic History   Marital status: Married    Spouse name: Not on file   Number of children: Not on file   Years of education: Not on file   Highest education level: Not on file  Occupational History   Not on file  Tobacco Use   Smoking status: Never   Smokeless tobacco: Never  Vaping Use   Vaping Use: Never used  Substance and Sexual Activity   Alcohol use: Yes    Comment: occ   Drug use: No   Sexual activity: Not on file    Comment: married, dairy farmer  Other Topics Concern   Not on file  Social History Narrative   Not on file   Social Determinants of Health   Financial Resource Strain: Not on file  Food Insecurity: Not on file  Transportation Needs: Not on file  Physical Activity: Not on file  Stress: Not on file  Social Connections: Not on file  Intimate Partner Violence: Not on file      Review of Systems  All other systems reviewed and are negative.     Objective:   Physical Exam Vitals reviewed.  Cardiovascular:     Rate and Rhythm: Normal rate and regular rhythm.     Heart sounds:  Normal heart sounds.  Pulmonary:     Effort: Pulmonary effort is normal. No respiratory distress.     Breath sounds: Normal breath sounds. No wheezing or rales.  Abdominal:     General: Bowel sounds are normal. There is no distension.     Palpations: Abdomen is soft.     Tenderness: There is no abdominal tenderness. There is no rebound.  Neurological:     Mental Status: He is alert.     Motor: No abnormal muscle tone.     Deep Tendon Reflexes: Reflexes are normal and symmetric.    CT angio chest (5/22)- IMPRESSION: 1. Stable mild ectasia of the ascending thoracic aorta without aneurysm. 2. Stable benign bilateral pulmonary nodules. 3. Extensive coronary artery calcifications. Please note that although the presence of coronary artery calcium documents the presence of coronary artery disease, the severity of this  disease and any potential stenosis cannot be assessed on this non-gated CT examination. Assessment for potential risk factor modification, dietary therapy or pharmacologic therapy may be warranted, if clinically indicated.      Assessment & Plan:  Essential hypertension - Plan: CBC with Differential/Platelet, Lipid panel, COMPLETE METABOLIC PANEL WITH GFR  Prostate cancer screening - Plan: PSA  Pure hypercholesterolemia - Plan: CBC with Differential/Platelet, Lipid panel, COMPLETE METABOLIC PANEL WITH GFR  Need for pneumococcal vaccination - Plan: Pneumococcal polysaccharide vaccine 23-valent greater than or equal to 2yo subcutaneous/IM Blood pressure today is outstanding.  Check a fasting lipid panel.  Goal LDL cholesterol is less than 70.  Patient is asymptomatic regarding his coronary artery calcium deposits.  I explained to the patient that we are unable to assess the severity of the stenosis based on the CT scan.  At the present time he is asymptomatic so I do not feel that he needs a stress test or catheterization however I explained to the patient that he needs to contact me immediately if he develops any shortness of breath or chest pain so that we can proceed with further work-up.  Patient also received his pneumonia vaccine today and I encouraged the shingles vaccine.  I will screen for prostate cancer with a PSA

## 2021-01-15 ENCOUNTER — Other Ambulatory Visit: Payer: Medicare PPO

## 2021-01-15 DIAGNOSIS — Z125 Encounter for screening for malignant neoplasm of prostate: Secondary | ICD-10-CM | POA: Diagnosis not present

## 2021-01-15 DIAGNOSIS — E7800 Pure hypercholesterolemia, unspecified: Secondary | ICD-10-CM | POA: Diagnosis not present

## 2021-01-15 DIAGNOSIS — I10 Essential (primary) hypertension: Secondary | ICD-10-CM | POA: Diagnosis not present

## 2021-01-16 LAB — COMPLETE METABOLIC PANEL WITH GFR
AG Ratio: 1.4 (calc) (ref 1.0–2.5)
ALT: 26 U/L (ref 9–46)
AST: 27 U/L (ref 10–35)
Albumin: 4.1 g/dL (ref 3.6–5.1)
Alkaline phosphatase (APISO): 46 U/L (ref 35–144)
BUN/Creatinine Ratio: 23 (calc) — ABNORMAL HIGH (ref 6–22)
BUN: 26 mg/dL — ABNORMAL HIGH (ref 7–25)
CO2: 30 mmol/L (ref 20–32)
Calcium: 9.2 mg/dL (ref 8.6–10.3)
Chloride: 105 mmol/L (ref 98–110)
Creat: 1.11 mg/dL (ref 0.70–1.35)
Globulin: 3 g/dL (calc) (ref 1.9–3.7)
Glucose, Bld: 88 mg/dL (ref 65–99)
Potassium: 4.6 mmol/L (ref 3.5–5.3)
Sodium: 140 mmol/L (ref 135–146)
Total Bilirubin: 0.8 mg/dL (ref 0.2–1.2)
Total Protein: 7.1 g/dL (ref 6.1–8.1)
eGFR: 72 mL/min/{1.73_m2} (ref >=60)

## 2021-01-16 LAB — CBC WITH DIFFERENTIAL/PLATELET
Absolute Monocytes: 619 cells/uL (ref 200–950)
Basophils Absolute: 43 cells/uL (ref 0–200)
Basophils Relative: 0.6 %
Eosinophils Absolute: 209 cells/uL (ref 15–500)
Eosinophils Relative: 2.9 %
HCT: 44.2 % (ref 38.5–50.0)
Hemoglobin: 14.7 g/dL (ref 13.2–17.1)
Lymphs Abs: 2470 cells/uL (ref 850–3900)
MCH: 31.1 pg (ref 27.0–33.0)
MCHC: 33.3 g/dL (ref 32.0–36.0)
MCV: 93.4 fL (ref 80.0–100.0)
MPV: 11.9 fL (ref 7.5–12.5)
Monocytes Relative: 8.6 %
Neutro Abs: 3859 cells/uL (ref 1500–7800)
Neutrophils Relative %: 53.6 %
Platelets: 187 10*3/uL (ref 140–400)
RBC: 4.73 10*6/uL (ref 4.20–5.80)
RDW: 11.8 % (ref 11.0–15.0)
Total Lymphocyte: 34.3 %
WBC: 7.2 10*3/uL (ref 3.8–10.8)

## 2021-01-16 LAB — LIPID PANEL
Cholesterol: 111 mg/dL (ref <200)
HDL: 38 mg/dL — ABNORMAL LOW (ref >=40)
LDL Cholesterol (Calc): 56 mg/dL (calc)
Non-HDL Cholesterol (Calc): 73 mg/dL (calc) (ref <130)
Total CHOL/HDL Ratio: 2.9 (calc) (ref <5.0)
Triglycerides: 83 mg/dL (ref <150)

## 2021-01-16 LAB — PSA: PSA: 0.34 ng/mL (ref <=4.00)

## 2021-01-25 ENCOUNTER — Other Ambulatory Visit: Payer: Self-pay | Admitting: Family Medicine

## 2021-02-01 ENCOUNTER — Other Ambulatory Visit: Payer: Self-pay | Admitting: Family Medicine

## 2021-02-10 DIAGNOSIS — H5203 Hypermetropia, bilateral: Secondary | ICD-10-CM | POA: Diagnosis not present

## 2021-02-13 ENCOUNTER — Telehealth: Payer: Self-pay

## 2021-02-13 ENCOUNTER — Ambulatory Visit: Payer: Medicare PPO

## 2021-02-13 ENCOUNTER — Other Ambulatory Visit: Payer: Self-pay

## 2021-02-13 NOTE — Telephone Encounter (Signed)
Pt was scheduled for AWV. Tried to call pt, no answer and left voice mail x 2. No return call from patient. Mjp,lpn

## 2021-02-19 ENCOUNTER — Telehealth: Payer: Self-pay | Admitting: Family Medicine

## 2021-02-19 NOTE — Telephone Encounter (Signed)
Left message for patient to call back and schedule Medicare Annual Wellness Visit (AWV) in office.  ° °If not able to come in office, please offer to do virtually or by telephone.  Left office number and my jabber #[phone]. ° °Due for AWVI ° °Please schedule at anytime with Nurse Health Advisor. °  °

## 2021-02-26 NOTE — Telephone Encounter (Signed)
I called patient.  Patient scheduled AWV on 02/27/21.

## 2021-02-27 ENCOUNTER — Ambulatory Visit (INDEPENDENT_AMBULATORY_CARE_PROVIDER_SITE_OTHER): Payer: Medicare PPO

## 2021-02-27 ENCOUNTER — Other Ambulatory Visit: Payer: Self-pay

## 2021-02-27 VITALS — Ht 69.0 in | Wt 230.0 lb

## 2021-02-27 DIAGNOSIS — Z1212 Encounter for screening for malignant neoplasm of rectum: Secondary | ICD-10-CM

## 2021-02-27 DIAGNOSIS — Z0000 Encounter for general adult medical examination without abnormal findings: Secondary | ICD-10-CM

## 2021-02-27 DIAGNOSIS — Z1211 Encounter for screening for malignant neoplasm of colon: Secondary | ICD-10-CM

## 2021-02-27 NOTE — Patient Instructions (Signed)
Shane Robinson , Thank you for taking time to come for your Medicare Wellness Visit. I appreciate your ongoing commitment to your health goals. Please review the following plan we discussed and let me know if I can assist you in the future.   Screening recommendations/referrals: Colonoscopy: Cologuard done 10/21/2018, repeat every 3 years. Order placed today.  Recommended yearly ophthalmology/optometry visit for glaucoma screening and checkup Recommended yearly dental visit for hygiene and checkup  Vaccinations: Influenza vaccine: Done 12/24/2020 Repeat annually  Pneumococcal vaccine: Done 01/14/2021.  Tdap vaccine: Done 10/22/2016 Repeat in 10 years  Shingles vaccine: Discussed. Can obtain at local pharmacy.   Covid-19: Done 01/28/2019 and 02/18/2019  Advanced directives: Copy in chart.  Conditions/risks identified: Aim for 30 minutes of exercise or brisk walking each day, drink 6-8 glasses of water and eat lots of fruits and vegetables.   Next appointment: Follow up in one year for your annual wellness visit. 2024.  Preventive Care 70 Years and Older, Male  Preventive care refers to lifestyle choices and visits with your health care provider that can promote health and wellness. What does preventive care include? A yearly physical exam. This is also called an annual well check. Dental exams once or twice a year. Routine eye exams. Ask your health care provider how often you should have your eyes checked. Personal lifestyle choices, including: Daily care of your teeth and gums. Regular physical activity. Eating a healthy diet. Avoiding tobacco and drug use. Limiting alcohol use. Practicing safe sex. Taking low doses of aspirin every day. Taking vitamin and mineral supplements as recommended by your health care provider. What happens during an annual well check? The services and screenings done by your health care provider during your annual well check will depend on your age,  overall health, lifestyle risk factors, and family history of disease. Counseling  Your health care provider may ask you questions about your: Alcohol use. Tobacco use. Drug use. Emotional well-being. Home and relationship well-being. Sexual activity. Eating habits. History of falls. Memory and ability to understand (cognition). Work and work Astronomer. Screening  You may have the following tests or measurements: Height, weight, and BMI. Blood pressure. Lipid and cholesterol levels. These may be checked every 5 years, or more frequently if you are over 25 years old. Skin check. Lung cancer screening. You may have this screening every year starting at age 21 if you have a 30-pack-year history of smoking and currently smoke or have quit within the past 15 years. Fecal occult blood test (FOBT) of the stool. You may have this test every year starting at age 46. Flexible sigmoidoscopy or colonoscopy. You may have a sigmoidoscopy every 5 years or a colonoscopy every 10 years starting at age 58. Prostate cancer screening. Recommendations will vary depending on your family history and other risks. Hepatitis C blood test. Hepatitis B blood test. Sexually transmitted disease (STD) testing. Diabetes screening. This is done by checking your blood sugar (glucose) after you have not eaten for a while (fasting). You may have this done every 1-3 years. Abdominal aortic aneurysm (AAA) screening. You may need this if you are a current or former smoker. Osteoporosis. You may be screened starting at age 7 if you are at high risk. Talk with your health care provider about your test results, treatment options, and if necessary, the need for more tests. Vaccines  Your health care provider may recommend certain vaccines, such as: Influenza vaccine. This is recommended every year. Tetanus, diphtheria, and acellular pertussis (  Tdap, Td) vaccine. You may need a Td booster every 10 years. Zoster vaccine.  You may need this after age 44. Pneumococcal 13-valent conjugate (PCV13) vaccine. One dose is recommended after age 65. Pneumococcal polysaccharide (PPSV23) vaccine. One dose is recommended after age 70. Talk to your health care provider about which screenings and vaccines you need and how often you need them. This information is not intended to replace advice given to you by your health care provider. Make sure you discuss any questions you have with your health care provider. Document Released: 01/18/2015 Document Revised: 09/11/2015 Document Reviewed: 10/23/2014 Elsevier Interactive Patient Education  2017 [employer].  Fall Prevention in the Home Falls can cause injuries. They can happen to people of all ages. There are many things you can do to make your home safe and to help prevent falls. What can I do on the outside of my home? Regularly fix the edges of walkways and driveways and fix any cracks. Remove anything that might make you trip as you walk through a door, such as a raised step or threshold. Trim any bushes or trees on the path to your home. Use bright outdoor lighting. Clear any walking paths of anything that might make someone trip, such as rocks or tools. Regularly check to see if handrails are loose or broken. Make sure that both sides of any steps have handrails. Any raised decks and porches should have guardrails on the edges. Have any leaves, snow, or ice cleared regularly. Use sand or salt on walking paths during winter. Clean up any spills in your garage right away. This includes oil or grease spills. What can I do in the bathroom? Use night lights. Install grab bars by the toilet and in the tub and shower. Do not use towel bars as grab bars. Use non-skid mats or decals in the tub or shower. If you need to sit down in the shower, use a plastic, non-slip stool. Keep the floor dry. Clean up any water that spills on the floor as soon as it happens. Remove soap  buildup in the tub or shower regularly. Attach bath mats securely with double-sided non-slip rug tape. Do not have throw rugs and other things on the floor that can make you trip. What can I do in the bedroom? Use night lights. Make sure that you have a light by your bed that is easy to reach. Do not use any sheets or blankets that are too big for your bed. They should not hang down onto the floor. Have a firm chair that has side arms. You can use this for support while you get dressed. Do not have throw rugs and other things on the floor that can make you trip. What can I do in the kitchen? Clean up any spills right away. Avoid walking on wet floors. Keep items that you use a lot in easy-to-reach places. If you need to reach something above you, use a strong step stool that has a grab bar. Keep electrical cords out of the way. Do not use floor polish or wax that makes floors slippery. If you must use wax, use non-skid floor wax. Do not have throw rugs and other things on the floor that can make you trip. What can I do with my stairs? Do not leave any items on the stairs. Make sure that there are handrails on both sides of the stairs and use them. Fix handrails that are broken or loose. Make sure that handrails are  as long as the stairways. Check any carpeting to make sure that it is firmly attached to the stairs. Fix any carpet that is loose or worn. Avoid having throw rugs at the top or bottom of the stairs. If you do have throw rugs, attach them to the floor with carpet tape. Make sure that you have a light switch at the top of the stairs and the bottom of the stairs. If you do not have them, ask someone to add them for you. What else can I do to help prevent falls? Wear shoes that: Do not have high heels. Have rubber bottoms. Are comfortable and fit you well. Are closed at the toe. Do not wear sandals. If you use a stepladder: Make sure that it is fully opened. Do not climb a closed  stepladder. Make sure that both sides of the stepladder are locked into place. Ask someone to hold it for you, if possible. Clearly mark and make sure that you can see: Any grab bars or handrails. First and last steps. Where the edge of each step is. Use tools that help you move around (mobility aids) if they are needed. These include: Canes. Walkers. Scooters. Crutches. Turn on the lights when you go into a dark area. Replace any light bulbs as soon as they burn out. Set up your furniture so you have a clear path. Avoid moving your furniture around. If any of your floors are uneven, fix them. If there are any pets around you, be aware of where they are. Review your medicines with your doctor. Some medicines can make you feel dizzy. This can increase your chance of falling. Ask your doctor what other things that you can do to help prevent falls. This information is not intended to replace advice given to you by your health care provider. Make sure you discuss any questions you have with your health care provider. Document Released: 10/18/2008 Document Revised: 05/30/2015 Document Reviewed: 01/26/2014 Elsevier Interactive Patient Education  2017 [employer].

## 2021-02-27 NOTE — Progress Notes (Signed)
Subjective:   Shane Robinson is a 71 y.o. male who presents for Medicare Annual/Subsequent preventive examination. Virtual Visit via Telephone Note  I connected with  Shane Robinson on 02/27/21 at  9:45 AM EST by telephone and verified that I am speaking with the correct person using two identifiers.  Location: Patient: HOME Provider: [facility] Persons participating in the virtual visit: patient/Nurse Health Advisor   I discussed the limitations, risks, security and privacy concerns of performing an evaluation and management service by telephone and the availability of in person appointments. The patient expressed understanding and agreed to proceed.  Interactive audio and video telecommunications were attempted between this nurse and patient, however failed, due to patient having technical difficulties OR patient did not have access to video capability.  We continued and completed visit with audio only.  Some vital signs may be absent or patient reported.   Darral Dash, LPN  Review of Systems     Cardiac Risk Factors include: advanced age (>41men, >32 women);hypertension;dyslipidemia;male gender;sedentary lifestyle;obesity (BMI >30kg/m2)     Objective:    Today's Vitals   02/27/21 1000  Weight: 230 lb (104.3 kg)  Height: 5\' 9"  (1.753 m)   Body mass index is 33.97 kg/m.  Advanced Directives 02/27/2021 07/21/2017 07/15/2017  Does Patient Have a Medical Advance Directive? Yes Yes Yes  Type of 09/15/2017 of [place];Living will Healthcare Power of [place];Living will Healthcare Power of [place];Living will  Does patient want to make changes to medical advance directive? - No - Patient declined No - Patient declined  Copy of Healthcare Power of Attorney in Chart? Yes - validated most recent copy scanned in chart (See row information) No - copy requested No - copy requested  Would patient like information on creating a medical advance directive? No -  Patient declined - -    Current Medications (verified) Outpatient Encounter Medications as of 02/27/2021  Medication Sig   benazepril (LOTENSIN) 20 MG tablet Take 1 tablet by mouth once daily   ibuprofen (ADVIL,MOTRIN) 200 MG tablet Take 600 mg by mouth every 6 (six) hours as needed for headache or moderate pain.   rosuvastatin (CRESTOR) 20 MG tablet TAKE 1 TABLET BY MOUTH ONCE DAILY   No facility-administered encounter medications on file as of 02/27/2021.    Allergies (verified) Latex   History: Past Medical History:  Diagnosis Date   Allergy    Elevated lipids    History of kidney stones    X 2 PASSED ON OWN   Hyperlipidemia    Hypertension    Lumbar pars defect    Pulmonary nodules    Thoracic aortic aneurysm without rupture    STABLE 4.1 CM STABLE PER DR BATRLE 05-26-17 EPIC NOTE   Past Surgical History:  Procedure Laterality Date   HERNIA REPAIR  1975   INGUINAL   MENISCUS REPAIR RIGHT KNEE  2016   SHOULDER SURGERY Right 2002   ROTATOR CUFF    TOTAL KNEE ARTHROPLASTY Right 07/21/2017   Procedure: RIGHT TOTAL KNEE ARTHROPLASTY, PERTIAL PATELLECTOMY, REPAIR QUADRICEPS TENDON;  Surgeon: 07/23/2017, MD;  Location: [facility];  Service: Orthopedics;  Laterality: Right;  120 mins   Family History  Problem Relation Age of Onset   Cancer Mother        stomach   Heart disease Father    Stroke Father    Social History   Socioeconomic History   Marital status: Married    Spouse name: Not on  file   Number of children: Not on file   Years of education: Not on file   Highest education level: Not on file  Occupational History   Not on file  Tobacco Use   Smoking status: Never   Smokeless tobacco: Never  Vaping Use   Vaping Use: Never used  Substance and Sexual Activity   Alcohol use: Yes    Comment: occ   Drug use: No   Sexual activity: Not on file    Comment: married, dairy farmer  Other Topics Concern   Not on file  Social History Narrative   Not on file    Social Determinants of Health   Financial Resource Strain: Low Risk    Difficulty of Paying Living Expenses: Not hard at all  Food Insecurity: No Food Insecurity   Worried About Programme researcher, broadcasting/film/videounning Out of Food in the Last Year: Never true   Ran Out of Food in the Last Year: Never true  Transportation Needs: No Transportation Needs   Lack of Transportation (Medical): No   Lack of Transportation (Non-Medical): No  Physical Activity: Inactive   Days of Exercise per Week: 0 days   Minutes of Exercise per Session: 0 min  Stress: No Stress Concern Present   Feeling of Stress : Not at all  Social Connections: Socially Integrated   Frequency of Communication with Friends and Family: More than three times a week   Frequency of Social Gatherings with Friends and Family: More than three times a week   Attends Religious Services: More than 4 times per year   Active Member of [employer]ubs or Organizations: Yes   Attends Engineer, structuralClub or Organization Meetings: More than 4 times per year   Marital Status: Married    Tobacco Counseling Counseling given: Not Answered   Clinical Intake:  Pre-visit preparation completed: Yes  Pain : No/denies pain     BMI - recorded: 33.97 Nutritional Status: BMI > 30  Obese Nutritional Risks: None Diabetes: No  How often do you need to have someone help you when you read instructions, pamphlets, or other written materials from your doctor or pharmacy?: 1 - Never  Diabetic?No  Interpreter Needed?: No  Information entered by :: mj Ladamien Rammel, lpn   Activities of Daily Living In your present state of health, do you have any difficulty performing the following activities: 02/27/2021  Hearing? N  Vision? N  Difficulty concentrating or making decisions? N  Walking or climbing stairs? N  Dressing or bathing? N  Doing errands, shopping? N  Preparing Food and eating ? N  Using the Toilet? N  In the past six months, have you accidently leaked urine? N  Do you have problems with  loss of bowel control? N  Managing your Medications? N  Managing your Finances? N  Housekeeping or managing your Housekeeping? N  Some recent data might be hidden    Patient Care Team: Donita BrooksPickard, Warren T, MD as PCP - General (Family Medicine)  Indicate any recent Medical Services you may have received from other than [facility] providers in the past year (date may be approximate).     Assessment:   This is a routine wellness examination for [employer].  Hearing/Vision screen Hearing Screening - Comments:: No problems with hearing. Vision Screening - Comments:: No glasses. 02/2021. Dr. Lorin PicketScott.  Dietary issues and exercise activities discussed: Current Exercise Habits: The patient has a physically strenuous job, but has no regular exercise apart from work., Exercise limited by: cardiac condition(s);respiratory conditions(s)   Goals  Addressed             This Visit's Progress    Exercise 3x per week (30 min per time)       Try to exercise more. Go hunting and fishing. Try to travel more.        Depression Screen PHQ 2/9 Scores 02/27/2021 06/18/2017 06/10/2016  PHQ - 2 Score 0 0 0  PHQ- 9 Score - - 0    Fall Risk Fall Risk  02/27/2021 06/18/2017 06/10/2016  Falls in the past year? 1 No No  Number falls in past yr: 1 - -  Injury with Fall? 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 - -  Follow up Falls prevention discussed - -    FALL RISK PREVENTION PERTAINING TO THE HOME:  Any stairs in or around the home? Yes  If so, are there any without handrails? No  Home free of loose throw rugs in walkways, pet beds, electrical cords, etc? Yes  Adequate lighting in your home to reduce risk of falls? Yes   ASSISTIVE DEVICES UTILIZED TO PREVENT FALLS:  Life alert? No  Use of a cane, walker or w/c? No  Grab bars in the bathroom? Yes  Shower chair or bench in shower? Yes  Elevated toilet seat or a  handicapped toilet? No   TIMED UP AND GO:  Was the test performed? No .   Cognitive Function:     6CIT Screen 02/27/2021  What Year? 0 points  What month? 0 points  What time? 0 points  Count back from 20 0 points  Months in reverse 0 points  Repeat phrase 0 points  Total Score 0    Immunizations Immunization History  Administered Date(s) Administered   Fluad Quad(high Dose 65+) 09/23/2018   Influenza Whole 10/05/2008   Influenza, High Dose Seasonal PF 10/22/2016   Influenza,inj,Quad PF,6+ Mos 12/27/2012, 09/26/2019, 12/24/2020   Influenza,inj,quad, With Preservative 11/05/2016   PFIZER(Purple Top)SARS-COV-2 Vaccination 01/28/2019, 02/18/2019   Pneumococcal Polysaccharide-23 01/14/2021   Td 01/05/2005   Tdap 10/22/2016    TDAP status: Up to date  Flu Vaccine status: Up to date  Pneumococcal vaccine status: Up to date  Covid-19 vaccine status: Completed vaccines  Qualifies for Shingles Vaccine? Yes   Zostavax completed No   Shingrix Completed?: No.    Education has been provided regarding the importance of this vaccine. Patient has been advised to call insurance company to determine out of pocket expense if they have not yet received this vaccine. Advised may also receive vaccine at local pharmacy or Health Dept. Verbalized acceptance and understanding.  Screening Tests Health Maintenance  Topic Date Due   Zoster Vaccines- Shingrix (1 of 2) Never done   COVID-19 Vaccine (3 - Booster for Pfizer series) 04/15/2019   Fecal DNA (Cologuard)  10/20/2021   Pneumonia Vaccine 31+ Years old (2 - PCV) 01/14/2022   TETANUS/TDAP  10/23/2026   INFLUENZA VACCINE  Completed   Hepatitis C Screening  Completed   HPV VACCINES  Aged Out   COLONOSCOPY (Pts 45-48yrs Insurance coverage will need to be confirmed)  Discontinued    Health Maintenance  Health Maintenance Due  Topic Date Due   Zoster Vaccines- Shingrix (1 of 2) Never done   COVID-19 Vaccine (3 - Booster for Pfizer  series) 04/15/2019    Colorectal cancer screening: Type of screening: Cologuard. Completed 10/21/2018.  Repeat every 3 years  Lung Cancer Screening: (Low Dose CT Chest recommended if Age 83-80 years, 30 pack-year currently smoking OR have quit w/in 15years.) does qualify.   Lung Cancer Screening Referral: Currently being followed for pulmonary nodules.  Additional Screening:  Hepatitis C Screening: does qualify; Completed 11/13/2016  Vision Screening: Recommended annual ophthalmology exams for early detection of glaucoma and other disorders of the eye. Is the patient up to date with their annual eye exam?  Yes  Who is the provider or what is the name of the office in which the patient attends annual eye exams? Dr. Lorin Picket If pt is not established with a provider, would they like to be referred to a provider to establish care? No .   Dental Screening: Recommended annual dental exams for proper oral hygiene  Community Resource Referral / Chronic Care Management: CRR required this visit?  No   CCM required this visit?  No      Plan:     I have personally reviewed and noted the following in the patients chart:   Medical and social history Use of alcohol, tobacco or illicit drugs  Current medications and supplements including opioid prescriptions. Patient is not currently taking opioid prescriptions. Functional ability and status Nutritional status Physical activity Advanced directives List of other physicians Hospitalizations, surgeries, and ER visits in previous 12 months Vitals Screenings to include cognitive, depression, and falls Referrals and appointments  In addition, I have reviewed and discussed with patient certain preventive protocols, quality metrics, and best practice recommendations. A written personalized care plan for preventive services as well as general preventive health recommendations were provided to patient.     Darral Dash, LPN   9/37/9024    Nurse Notes: Pt is up to date with current health maintenance. Discussed Shingrix and how to obtain.

## 2021-04-01 ENCOUNTER — Emergency Department (HOSPITAL_COMMUNITY): Payer: Medicare PPO

## 2021-04-01 ENCOUNTER — Emergency Department (HOSPITAL_COMMUNITY)
Admission: EM | Admit: 2021-04-01 | Discharge: 2021-04-02 | Disposition: A | Discharge status: Home / Self Care | Payer: Medicare PPO | Attending: Emergency Medicine | Admitting: Emergency Medicine

## 2021-04-01 ENCOUNTER — Encounter (HOSPITAL_COMMUNITY): Payer: Self-pay | Admitting: Emergency Medicine

## 2021-04-01 DIAGNOSIS — S298XXA Other specified injuries of thorax, initial encounter: Secondary | ICD-10-CM

## 2021-04-01 DIAGNOSIS — S3993XA Unspecified injury of pelvis, initial encounter: Secondary | ICD-10-CM | POA: Diagnosis not present

## 2021-04-01 DIAGNOSIS — S3991XA Unspecified injury of abdomen, initial encounter: Secondary | ICD-10-CM | POA: Diagnosis not present

## 2021-04-01 DIAGNOSIS — W231XXA Caught, crushed, jammed, or pinched between stationary objects, initial encounter: Secondary | ICD-10-CM | POA: Diagnosis not present

## 2021-04-01 DIAGNOSIS — T07XXXA Unspecified multiple injuries, initial encounter: Secondary | ICD-10-CM | POA: Diagnosis not present

## 2021-04-01 DIAGNOSIS — I700 Atherosclerosis of aorta: Secondary | ICD-10-CM | POA: Diagnosis not present

## 2021-04-01 DIAGNOSIS — R609 Edema, unspecified: Secondary | ICD-10-CM | POA: Diagnosis not present

## 2021-04-01 DIAGNOSIS — S2241XA Multiple fractures of ribs, right side, initial encounter for closed fracture: Secondary | ICD-10-CM | POA: Diagnosis not present

## 2021-04-01 DIAGNOSIS — I10 Essential (primary) hypertension: Secondary | ICD-10-CM | POA: Diagnosis not present

## 2021-04-01 DIAGNOSIS — S280XXA Crushed chest, initial encounter: Secondary | ICD-10-CM | POA: Diagnosis not present

## 2021-04-01 DIAGNOSIS — S299XXA Unspecified injury of thorax, initial encounter: Secondary | ICD-10-CM | POA: Insufficient documentation

## 2021-04-01 DIAGNOSIS — Z91040 Latex allergy status: Secondary | ICD-10-CM | POA: Insufficient documentation

## 2021-04-01 DIAGNOSIS — I7121 Aneurysm of the ascending aorta, without rupture: Secondary | ICD-10-CM | POA: Insufficient documentation

## 2021-04-01 LAB — BASIC METABOLIC PANEL
Anion gap: 6 (ref 5–15)
BUN: 22 mg/dL (ref 8–23)
CO2: 24 mmol/L (ref 22–32)
Calcium: 8.8 mg/dL — ABNORMAL LOW (ref 8.9–10.3)
Chloride: 107 mmol/L (ref 98–111)
Creatinine, Ser: 0.9 mg/dL (ref 0.61–1.24)
GFR, Estimated: >60 mL/min (ref >60)
Glucose, Bld: 103 mg/dL — ABNORMAL HIGH (ref 70–99)
Potassium: 4.1 mmol/L (ref 3.5–5.1)
Sodium: 137 mmol/L (ref 135–145)

## 2021-04-01 LAB — CBC WITH DIFFERENTIAL/PLATELET
Abs Immature Granulocytes: 0.04 10*3/uL (ref 0.00–0.07)
Basophils Absolute: 0.1 10*3/uL (ref 0.0–0.1)
Basophils Relative: 1 %
Eosinophils Absolute: 0.1 10*3/uL (ref 0.0–0.5)
Eosinophils Relative: 1 %
HCT: 43.9 % (ref 39.0–52.0)
Hemoglobin: 14.3 g/dL (ref 13.0–17.0)
Immature Granulocytes: 0 %
Lymphocytes Relative: 21 %
Lymphs Abs: 2.3 10*3/uL (ref 0.7–4.0)
MCH: 30.9 pg (ref 26.0–34.0)
MCHC: 32.6 g/dL (ref 30.0–36.0)
MCV: 94.8 fL (ref 80.0–100.0)
Monocytes Absolute: 0.9 10*3/uL (ref 0.1–1.0)
Monocytes Relative: 8 %
Neutro Abs: 7.6 10*3/uL (ref 1.7–7.7)
Neutrophils Relative %: 69 %
Platelets: 178 10*3/uL (ref 150–400)
RBC: 4.63 MIL/uL (ref 4.22–5.81)
RDW: 13.2 % (ref 11.5–15.5)
WBC: 11 10*3/uL — ABNORMAL HIGH (ref 4.0–10.5)
nRBC: 0 % (ref 0.0–0.2)

## 2021-04-01 MED ORDER — IOHEXOL 350 MG/ML SOLN
100.0000 mL | Freq: Once | INTRAVENOUS | Status: AC | PRN
  Administered 2021-04-01: 100 mL via INTRAVENOUS

## 2021-04-01 MED ORDER — FENTANYL CITRATE PF 50 MCG/ML IJ SOSY
50.0000 ug | PREFILLED_SYRINGE | Freq: Once | INTRAMUSCULAR | Status: AC
  Administered 2021-04-01: 50 ug via INTRAVENOUS
  Filled 2021-04-01: qty 1

## 2021-04-01 NOTE — ED Provider Notes (Signed)
?[facility]-EMERGENCY DEPT ?Provider Note ? ? ?CSN: [id number] ?Arrival date & time: 04/01/21  2040 ? ?  ? ?History ? ?Chief Complaint  ?Patient presents with  ? Rib Injury  ? ? ?Shane Robinson is a 71 y.o. male.  He has a history of an ascending thoracic aneurysm, hypertension, hyperlipidemia.  He was tending to his cow when she backed up into him and pinned him against the stall.  He took most of the impact on the right posterior ribs and scapula.  There was no loss of consciousness.  He said he heard a pop.  Complaining of pain with bending twisting and taking a deep breath.  Denies any head or neck injury.  No abdominal pain.  No numbness or weakness.  Has tried nothing for it. ? ?The history is provided by the patient.  ?Trauma ?Mechanism of injury: Crush injury ?Injury location: torso ?Injury location detail: back ?Incident location: yard ?Arrived directly from scene: yes ? ?Crush injury: ?     Mechanism: Animal.  ? ?Protective equipment:  ?     None ? ?EMS/PTA data: ?     Bystander interventions: none ?     Ambulatory at scene: yes ?     Blood loss: none ?     Responsiveness: alert ?     Loss of consciousness: no ?     Amnesic to event: no ?     Airway interventions: none ?     Breathing interventions: none ?     IV access: none ?     Fluids administered: none ?     Cardiac interventions: none ?     Medications administered: none ?     Immobilization: none ?     Airway condition since incident: stable ?     Breathing condition since incident: stable ?     Circulation condition since incident: stable ?     Mental status condition since incident: stable ?     Disability condition since incident: stable ? ?Current symptoms: ?     Associated symptoms:  ?          Reports back pain, chest pain and difficulty breathing.  ?          Denies abdominal pain, headache, loss of consciousness, nausea, neck pain and vomiting.  ? ?Relevant PMH: ?     Pharmacological risk factors:  ?          No  anticoagulation therapy.  ? ?  ? ?Home Medications ?Prior to Admission medications   ?Medication Sig Start Date End Date Taking? Authorizing Provider  ?benazepril (LOTENSIN) 20 MG tablet Take 1 tablet by mouth once daily 01/27/21   Donita Brooks, MD  ?ibuprofen (ADVIL,MOTRIN) 200 MG tablet Take 600 mg by mouth every 6 (six) hours as needed for headache or moderate pain.    [provider]  ?rosuvastatin (CRESTOR) 20 MG tablet TAKE 1 TABLET BY MOUTH ONCE DAILY 02/03/21   Donita Brooks, MD  ?   ? ?Allergies    ?Latex   ? ?Review of Systems   ?Review of Systems  ?Constitutional:  Negative for fever.  ?HENT:  Negative for sore throat.   ?Respiratory:  Positive for shortness of breath.   ?Cardiovascular:  Positive for chest pain.  ?Gastrointestinal:  Negative for abdominal pain, nausea and vomiting.  ?Genitourinary:  Negative for dysuria.  ?Musculoskeletal:  Positive for back pain. Negative for neck pain.  ?Skin:  Negative for  wound.  ?Neurological:  Negative for loss of consciousness and headaches.  ? ?Physical Exam ?Updated Vital Signs ?BP (!) 141/94   Pulse 71   Temp 98.3 ?F (36.8 ?C) (Oral)   Resp 20   Wt 104.3 kg   SpO2 95%   BMI 33.97 kg/m?  ?Physical Exam ?Vitals and nursing note reviewed.  ?Constitutional:   ?   General: He is not in acute distress. ?   Appearance: Normal appearance. He is well-developed.  ?HENT:  ?   Head: Normocephalic and atraumatic.  ?Eyes:  ?   Conjunctiva/sclera: Conjunctivae normal.  ?Cardiovascular:  ?   Rate and Rhythm: Normal rate and regular rhythm.  ?   Heart sounds: No murmur heard. ?Pulmonary:  ?   Effort: Pulmonary effort is normal. No respiratory distress.  ?   Breath sounds: Normal breath sounds.  ?Chest:  ?   Chest wall: Tenderness (r posterior) present.  ?Abdominal:  ?   Palpations: Abdomen is soft.  ?   Tenderness: There is no abdominal tenderness.  ?Musculoskeletal:     ?   General: Signs of injury present. No swelling or deformity. Normal range of  motion.  ?   Cervical back: Neck supple.  ?   Comments: Some abrasions on left forearm  ?Skin: ?   General: Skin is warm and dry.  ?   Capillary Refill: Capillary refill takes less than 2 seconds.  ?Neurological:  ?   General: No focal deficit present.  ?   Mental Status: He is alert.  ?   Sensory: No sensory deficit.  ?   Motor: No weakness.  ? ? ?ED Results / Procedures / Treatments   ?Labs ?(all labs ordered are listed, but only abnormal results are displayed) ?Labs Reviewed  ?BASIC METABOLIC PANEL - Abnormal; Notable for the following components:  ?    Result Value  ? Glucose, Bld 103 (*)   ? Calcium 8.8 (*)   ? All other components within normal limits  ?CBC WITH DIFFERENTIAL/PLATELET - Abnormal; Notable for the following components:  ? WBC 11.0 (*)   ? All other components within normal limits  ? ? ?EKG ?None ? ?Radiology ?DG Ribs Unilateral W/Chest Right ? ?Result Date: 04/01/2021 ?CLINICAL DATA:  Crush injury, right lower rib pain, pain with inspiration EXAM: RIGHT RIBS AND CHEST - 3+ VIEW COMPARISON:  05/29/2020 FINDINGS: Frontal view of the chest as well as frontal and frogleg lateral views of the right thoracic cage are obtained. The cardiac silhouette is unremarkable. No airspace disease, effusion, or pneumothorax. There are no acute displaced rib fractures. IMPRESSION: 1. No acute intrathoracic process.  No displaced rib fractures. Electronically Signed   By: Sharlet Salina M.D.   On: 04/01/2021 21:52  ? ?CT Angio Chest/Abd/Pel for Dissection W and/or W/WO ? ?Addendum Date: 04/02/2021   ?ADDENDUM REPORT: 04/02/2021 13:08 ADDENDUM: Upon re-review of the CT angio chest, abdomen, pelvis 4 dissection with and without contrast, performed at 11:24 p.m. [place] on April 01, 2021, the following findings are noted: Acute nondisplaced anterior fourth, fifth, and sixth right rib fractures are seen (axial CT images 67, 81 and 97, CT series 5). These results will be called to the ordering clinician or representative  by the Radiologist Assistant, and communication documented in the PACS or zVision Dashboard. Electronically Signed   By: Aram Candela M.D.   On: 04/02/2021 13:08  ? ?Addendum Date: 04/02/2021   ?ADDENDUM REPORT: 04/02/2021 10:03 ADDENDUM: Original report dictated by Aram Candela, MD.  Addendum dictated by Leanna BattlesMelinda Blietz, MD. There is a nondisplaced fracture of the right third anterior rib (5/67). Electronically Signed   By: Leanna BattlesMelinda  Blietz M.D.   On: 04/02/2021 10:03  ? ?Result Date: 04/02/2021 ?CLINICAL DATA:  Status post trauma. EXAM: CT ANGIOGRAPHY CHEST, ABDOMEN AND PELVIS TECHNIQUE: Non-contrast CT of the chest was initially obtained. Multidetector CT imaging through the chest, abdomen and pelvis was performed using the standard protocol during bolus administration of intravenous contrast. Multiplanar reconstructed images and MIPs were obtained and reviewed to evaluate the vascular anatomy. RADIATION DOSE REDUCTION: This exam was performed according to the departmental dose-optimization program which includes automated exposure control, adjustment of the mA and/or kV according to patient size and/or use of iterative reconstruction technique. CONTRAST:  100mL OMNIPAQUE IOHEXOL 350 MG/ML SOLN COMPARISON:  May 29, 2020 FINDINGS: CTA CHEST FINDINGS Cardiovascular: The thoracic aorta is normal in appearance without evidence of aneurysmal dilatation or dissection. Satisfactory opacification of the pulmonary arteries to the segmental level. No evidence of pulmonary embolism. Normal heart size with marked severity coronary artery calcification. No pericardial effusion. Mediastinum/Nodes: No enlarged mediastinal, hilar, or axillary lymph nodes. Thyroid gland, trachea, and esophagus demonstrate no significant findings. Lungs/Pleura: A 3 mm pleural based noncalcified lung nodule is seen within the lateral aspect of the right upper lobe (axial CT image 67, CT series 5). 3 mm and 6 mm noncalcified anterior right  middle lobe lung nodules are seen (axial CT image 79 and 86, CT series 5). 7 mm and 8 mm noncalcified anterolateral right lower lobe lung nodules are present (axial CT image 91 and 105, CT series 5). Additional subcentimeter non

## 2021-04-01 NOTE — ED Triage Notes (Signed)
Pt in via [facility] after being crushed between a cow's hip and a metal cow stall. C/o pain to R posterior rib cage, pain worse with deep breaths. Also has L forearm avulsion present. Pt states he heard a "pop". Breathing e/u, VSS, GCS 15 ?

## 2021-04-01 NOTE — ED Notes (Signed)
Patient transported to CT 

## 2021-04-02 NOTE — ED Notes (Signed)
Patient verbalizes understanding of discharge instructions. Opportunity for questioning and answers were provided. Armband removed by staff, pt discharged from ED. Wheeled out to lobby  

## 2021-04-14 ENCOUNTER — Telehealth: Payer: Self-pay | Admitting: Family Medicine

## 2021-04-14 NOTE — Telephone Encounter (Signed)
Received call from patient's spouse to report she and the patient both tested positive for COVID today. Stated patient experiencing body pain; asked if patient can receive Rx for medication to manage sx. ? ?Please advise at [phone].  ?

## 2021-04-15 ENCOUNTER — Ambulatory Visit: Payer: Medicare PPO | Admitting: Family Medicine

## 2021-04-15 ENCOUNTER — Other Ambulatory Visit: Payer: Self-pay | Admitting: Family Medicine

## 2021-04-15 MED ORDER — NIRMATRELVIR/RITONAVIR (PAXLOVID)TABLET: 3.0000 | ORAL_TABLET | Freq: Two times a day (BID) | ORAL | 0 refills | Status: AC

## 2021-04-15 NOTE — Telephone Encounter (Signed)
Patient advised. Nothing further needed at this time.  ? ?

## 2021-04-21 ENCOUNTER — Ambulatory Visit: Payer: Medicare PPO | Admitting: Family Medicine

## 2021-04-21 VITALS — BP 136/78 | HR 76 | Temp 97.6°F | Ht 69.0 in | Wt 228.6 lb

## 2021-04-21 DIAGNOSIS — S2241XD Multiple fractures of ribs, right side, subsequent encounter for fracture with routine healing: Secondary | ICD-10-CM

## 2021-04-21 MED ORDER — AMOXICILLIN-POT CLAVULANATE 875-125 MG PO TABS: 1.0000 | ORAL_TABLET | Freq: Two times a day (BID) | ORAL | 0 refills | Status: DC

## 2021-04-21 NOTE — Progress Notes (Signed)
? ?Subjective:  ? ? Patient ID: Shane Robinson, male    DOB: 09-10-1950, 71 y.o.   MRN: [id number] ?Patient was recently injured at work at the end of March.  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  Shane Robinson heard a pop.  A CT scan obtained in the emergency room at that time showed a fracture in his anterior right third fourth fifth and sixth ribs.  Shane Robinson did well afterwards and had significant pain but was able to manage it with Tylenol and ibuprofen.  Shane Robinson was then diagnosed with COVID.  Shane Robinson has completed the 5-day course with Paxlovid however Shane Robinson continues to have pain in his anterior right rib area.  Shane Robinson denies cough or fever or shortness of breath.  However at times with shallow inspiration his pulse oximetry is 91%.  With several deep breaths Shane Robinson can get it back up to 97%.  Shane Robinson denies any pleurisy or hemoptysis or fever or chills or wheezing ?Past Medical History:  ?Diagnosis Date  ? Allergy   ? Elevated lipids   ? History of kidney stones   ? X 2 PASSED ON OWN  ? Hyperlipidemia   ? Hypertension   ? Lumbar pars defect   ? Pulmonary nodules   ? Thoracic aortic aneurysm without rupture   ? STABLE 4.1 CM STABLE PER DR BATRLE 05-26-17 EPIC NOTE  ? ?Past Surgical History:  ?Procedure Laterality Date  ? HERNIA REPAIR  1975  ? INGUINAL  ? MENISCUS REPAIR RIGHT KNEE  2016  ? SHOULDER SURGERY Right 2002  ? ROTATOR CUFF   ? TOTAL KNEE ARTHROPLASTY Right 07/21/2017  ? Procedure: RIGHT TOTAL KNEE ARTHROPLASTY, PERTIAL PATELLECTOMY, REPAIR QUADRICEPS TENDON;  Surgeon: Jene Every, MD;  Location: [facility];  Service: Orthopedics;  Laterality: Right;  120 mins  ? ?Current Outpatient Medications on File Prior to Visit  ?Medication Sig Dispense Refill  ? benazepril (LOTENSIN) 20 MG tablet Take 1 tablet by mouth once daily 90 tablet 3  ? ibuprofen (ADVIL,MOTRIN) 200 MG tablet Take 600 mg by mouth every 6 (six) hours as needed for headache or moderate pain.    ? rosuvastatin (CRESTOR) 20 MG tablet TAKE 1  TABLET BY MOUTH ONCE DAILY 90 tablet 3  ? ?No current facility-administered medications on file prior to visit.  ? ?Allergies  ?Allergen Reactions  ? Latex Hives  ? ?Social History  ? ?Socioeconomic History  ? Marital status: Married  ?  Spouse name: Not on file  ? Number of children: Not on file  ? Years of education: Not on file  ? Highest education level: Not on file  ?Occupational History  ? Not on file  ?Tobacco Use  ? Smoking status: Never  ? Smokeless tobacco: Never  ?Vaping Use  ? Vaping Use: Never used  ?Substance and Sexual Activity  ? Alcohol use: Yes  ?  Comment: occ  ? Drug use: No  ? Sexual activity: Not on file  ?  Comment: married, dairy farmer  ?Other Topics Concern  ? Not on file  ?Social History Narrative  ? Not on file  ? ?Social Determinants of Health  ? ?Financial Resource Strain: Low Risk   ? Difficulty of Paying Living Expenses: Not hard at all  ?Food Insecurity: No Food Insecurity  ? Worried About Programme researcher, broadcasting/film/video in the Last Year: Never true  ? Ran Out of Food in the Last Year: Never true  ?Transportation  Needs: No Transportation Needs  ? Lack of Transportation (Medical): No  ? Lack of Transportation (Non-Medical): No  ?Physical Activity: Inactive  ? Days of Exercise per Week: 0 days  ? Minutes of Exercise per Session: 0 min  ?Stress: No Stress Concern Present  ? Feeling of Stress : Not at all  ?Social Connections: Socially Integrated  ? Frequency of Communication with Friends and Family: More than three times a week  ? Frequency of Social Gatherings with Friends and Family: More than three times a week  ? Attends Religious Services: More than 4 times per year  ? Active Member of Clubs or Organizations: Yes  ? Attends Banker Meetings: More than 4 times per year  ? Marital Status: Married  ?Intimate Partner Violence: Not At Risk  ? Fear of Current or Ex-Partner: No  ? Emotionally Abused: No  ? Physically Abused: No  ? Sexually Abused: No  ? ? ? ? ?Review of Systems  ?All  other systems reviewed and are negative. ? ?   ?Objective:  ? Physical Exam ?Vitals reviewed.  ?Cardiovascular:  ?   Rate and Rhythm: Normal rate and regular rhythm.  ?   Heart sounds: Normal heart sounds.  ?Pulmonary:  ?   Effort: Pulmonary effort is normal. No respiratory distress.  ?   Breath sounds: Normal breath sounds. No stridor. No wheezing, rhonchi or rales.  ?Chest:  ?   Chest wall: Tenderness present.  ?Abdominal:  ?   General: Bowel sounds are normal. There is no distension.  ?   Palpations: Abdomen is soft.  ?   Tenderness: There is no abdominal tenderness. There is no rebound.  ?Musculoskeletal:  ?   Right lower leg: No edema.  ?   Left lower leg: No edema.  ?Neurological:  ?   Mental Status: Shane Robinson is alert.  ?   Motor: No abnormal muscle tone.  ?   Deep Tendon Reflexes: Reflexes are normal and symmetric.  ? ? ?   ?Assessment & Plan:  ?Closed fracture of multiple ribs of right side with routine healing, subsequent encounter ?Patient was unaware that Shane Robinson had fractured ribs.  Shane Robinson states that this explains why Shane Robinson is tender on his right side.  That was his concern.  We recommended deep breathing exercises including incentive spirometry to help prevent pneumonia.  His lungs are clear to auscultation bilaterally today and Shane Robinson has no wheezes crackles or rails.  Shane Robinson states that Shane Robinson does not require any pain medication.  We recommended monitoring for any evidence of a secondary pneumonia.  If Shane Robinson develops fevers or chills or shortness of breath or worsening pain I want him to start taking Augmentin and notify me immediately.  However I feel that Shane Robinson is on the way to recovery.  With incentive spirometry, anticipate gradual healing of the broken ribs over the next 1 to 2 weeks ?

## 2021-07-16 DIAGNOSIS — M25552 Pain in left hip: Secondary | ICD-10-CM | POA: Diagnosis not present

## 2021-07-16 DIAGNOSIS — M545 Low back pain, unspecified: Secondary | ICD-10-CM | POA: Diagnosis not present

## 2021-08-06 DIAGNOSIS — M5442 Lumbago with sciatica, left side: Secondary | ICD-10-CM | POA: Diagnosis not present

## 2021-08-20 DIAGNOSIS — M5442 Lumbago with sciatica, left side: Secondary | ICD-10-CM | POA: Diagnosis not present

## 2021-08-27 DIAGNOSIS — M5442 Lumbago with sciatica, left side: Secondary | ICD-10-CM | POA: Diagnosis not present

## 2021-09-10 DIAGNOSIS — M25551 Pain in right hip: Secondary | ICD-10-CM | POA: Diagnosis not present

## 2021-09-10 DIAGNOSIS — M25552 Pain in left hip: Secondary | ICD-10-CM | POA: Diagnosis not present

## 2021-09-10 DIAGNOSIS — M545 Low back pain, unspecified: Secondary | ICD-10-CM | POA: Diagnosis not present

## 2021-09-17 DIAGNOSIS — M25551 Pain in right hip: Secondary | ICD-10-CM | POA: Diagnosis not present

## 2021-09-22 DIAGNOSIS — M25552 Pain in left hip: Secondary | ICD-10-CM | POA: Diagnosis not present

## 2021-10-20 DIAGNOSIS — M25552 Pain in left hip: Secondary | ICD-10-CM | POA: Diagnosis not present

## 2021-11-11 DIAGNOSIS — Z1212 Encounter for screening for malignant neoplasm of rectum: Secondary | ICD-10-CM | POA: Diagnosis not present

## 2021-11-11 DIAGNOSIS — Z1211 Encounter for screening for malignant neoplasm of colon: Secondary | ICD-10-CM | POA: Diagnosis not present

## 2021-11-22 LAB — COLOGUARD: COLOGUARD: NEGATIVE

## 2021-12-01 DIAGNOSIS — M25552 Pain in left hip: Secondary | ICD-10-CM | POA: Diagnosis not present

## 2022-01-06 DIAGNOSIS — D485 Neoplasm of uncertain behavior of skin: Secondary | ICD-10-CM | POA: Diagnosis not present

## 2022-01-06 DIAGNOSIS — Z85828 Personal history of other malignant neoplasm of skin: Secondary | ICD-10-CM | POA: Diagnosis not present

## 2022-01-06 DIAGNOSIS — L570 Actinic keratosis: Secondary | ICD-10-CM | POA: Diagnosis not present

## 2022-01-06 DIAGNOSIS — L821 Other seborrheic keratosis: Secondary | ICD-10-CM | POA: Diagnosis not present

## 2022-01-12 ENCOUNTER — Other Ambulatory Visit: Payer: Self-pay | Admitting: Family Medicine

## 2022-01-13 NOTE — Telephone Encounter (Signed)
Requested Prescriptions  Pending Prescriptions Disp Refills   benazepril (LOTENSIN) 20 MG tablet [Pharmacy Med Name: Benazepril HCl 20 MG Oral Tablet] 90 tablet 0    Sig: Take 1 tablet by mouth once daily     Cardiovascular:  ACE Inhibitors Failed - 01/12/2022  6:50 AM      Failed - Cr in normal range and within 180 days    Creat  Date Value Ref Range Status  01/15/2021 1.11 0.70 - 1.35 mg/dL Final   Creatinine, Ser  Date Value Ref Range Status  04/01/2021 0.90 0.61 - 1.24 mg/dL Final         Failed - K in normal range and within 180 days    Potassium  Date Value Ref Range Status  04/01/2021 4.1 3.5 - 5.1 mmol/L Final         Failed - Valid encounter within last 6 months    Recent Outpatient Visits           8 months ago Closed fracture of multiple ribs of right side with routine healing, subsequent encounter   [place] Susy Frizzle, MD   12 months ago Essential hypertension   [place] Susy Frizzle, MD   2 years ago Need for immunization against influenza   [place], Warren T, MD   3 years ago Elevated blood sugar   [place] Dennard Schaumann, Cammie Mcgee, MD   4 years ago Right leg swelling   [place], Cammie Mcgee, MD              Passed - Patient is not pregnant      Passed - Last BP in normal range    BP Readings from Last 1 Encounters:  04/21/21 136/78

## 2022-01-26 ENCOUNTER — Other Ambulatory Visit: Payer: Self-pay | Admitting: Family Medicine

## 2022-01-28 DIAGNOSIS — C44329 Squamous cell carcinoma of skin of other parts of face: Secondary | ICD-10-CM | POA: Insufficient documentation

## 2022-03-06 ENCOUNTER — Ambulatory Visit: Payer: Medicare PPO

## 2022-03-06 NOTE — Progress Notes (Signed)
Spoke w/pt, per pt wants to reschedule AWV

## 2022-03-11 ENCOUNTER — Telehealth: Payer: Self-pay | Admitting: Family Medicine

## 2022-03-11 NOTE — Patient Instructions (Incomplete)
Mr. Shane Robinson , Thank you for taking time to come for your Medicare Wellness Visit. I appreciate your ongoing commitment to your health goals. Please review the following plan we discussed and let me know if I can assist you in the future.   These are the goals we discussed:  Goals      Exercise 3x per week (30 min per time)     Try to exercise more. Go hunting and fishing. Try to travel more.         This is a list of the screening recommended for you and due dates:  Health Maintenance  Topic Date Due   Zoster (Shingles) Vaccine (1 of 2) Never done   COVID-19 Vaccine (4 - 2023-24 season) 12/07/2021   Pneumonia Vaccine (2 of 2 - PCV) 01/14/2022   Medicare Annual Wellness Visit  02/27/2022   Cologuard (Stool DNA test)  11/11/2024   DTaP/Tdap/Td vaccine (3 - Td or Tdap) 10/23/2026   Flu Shot  Completed   Hepatitis C Screening: USPSTF Recommendation to screen - Ages 18-79 yo.  Completed   HPV Vaccine  Aged Out   Colon Cancer Screening  Discontinued    Advanced directives:  We have a copy of your advanced directives available in your record should your provider ever need to access them.   Conditions/risks identified: Aim for 30 minutes of exercise or brisk walking, 6-8 glasses of water, and 5 servings of fruits and vegetables each day.   Next appointment: Follow up in one year for your annual wellness visit.   Preventive Care 53 Years and Older, Male  Preventive care refers to lifestyle choices and visits with your health care provider that can promote health and wellness. What does preventive care include? A yearly physical exam. This is also called an annual well check. Dental exams once or twice a year. Routine eye exams. Ask your health care provider how often you should have your eyes checked. Personal lifestyle choices, including: Daily care of your teeth and gums. Regular physical activity. Eating a healthy diet. Avoiding tobacco and drug use. Limiting alcohol  use. Practicing safe sex. Taking low doses of aspirin every day. Taking vitamin and mineral supplements as recommended by your health care provider. What happens during an annual well check? The services and screenings done by your health care provider during your annual well check will depend on your age, overall health, lifestyle risk factors, and family history of disease. Counseling  Your health care provider may ask you questions about your: Alcohol use. Tobacco use. Drug use. Emotional well-being. Home and relationship well-being. Sexual activity. Eating habits. History of falls. Memory and ability to understand (cognition). Work and work Statistician. Screening  You may have the following tests or measurements: Height, weight, and BMI. Blood pressure. Lipid and cholesterol levels. These may be checked every 5 years, or more frequently if you are over 50 years old. Skin check. Lung cancer screening. You may have this screening every year starting at age 59 if you have a 30-pack-year history of smoking and currently smoke or have quit within the past 15 years. Fecal occult blood test (FOBT) of the stool. You may have this test every year starting at age 78. Flexible sigmoidoscopy or colonoscopy. You may have a sigmoidoscopy every 5 years or a colonoscopy every 10 years starting at age 71. Prostate cancer screening. Recommendations will vary depending on your family history and other risks. Hepatitis C blood test. Hepatitis B blood test. Sexually transmitted disease (  STD) testing. Diabetes screening. This is done by checking your blood sugar (glucose) after you have not eaten for a while (fasting). You may have this done every 1-3 years. Abdominal aortic aneurysm (AAA) screening. You may need this if you are a current or former smoker. Osteoporosis. You may be screened starting at age 65 if you are at high risk. Talk with your health care provider about your test results,  treatment options, and if necessary, the need for more tests. Vaccines  Your health care provider may recommend certain vaccines, such as: Influenza vaccine. This is recommended every year. Tetanus, diphtheria, and acellular pertussis (Tdap, Td) vaccine. You may need a Td booster every 10 years. Zoster vaccine. You may need this after age 4. Pneumococcal 13-valent conjugate (PCV13) vaccine. One dose is recommended after age 50. Pneumococcal polysaccharide (PPSV23) vaccine. One dose is recommended after age 57. Talk to your health care provider about which screenings and vaccines you need and how often you need them. This information is not intended to replace advice given to you by your health care provider. Make sure you discuss any questions you have with your health care provider. Document Released: 01/18/2015 Document Revised: 09/11/2015 Document Reviewed: 10/23/2014 Elsevier Interactive Patient Education  2017 [place] Prevention in the Home Falls can cause injuries. They can happen to people of all ages. There are many things you can do to make your home safe and to help prevent falls. What can I do on the outside of my home? Regularly fix the edges of walkways and driveways and fix any cracks. Remove anything that might make you trip as you walk through a door, such as a raised step or threshold. Trim any bushes or trees on the path to your home. Use bright outdoor lighting. Clear any walking paths of anything that might make someone trip, such as rocks or tools. Regularly check to see if handrails are loose or broken. Make sure that both sides of any steps have handrails. Any raised decks and porches should have guardrails on the edges. Have any leaves, snow, or ice cleared regularly. Use sand or salt on walking paths during winter. Clean up any spills in your garage right away. This includes oil or grease spills. What can I do in the bathroom? Use night  lights. Install grab bars by the toilet and in the tub and shower. Do not use towel bars as grab bars. Use non-skid mats or decals in the tub or shower. If you need to sit down in the shower, use a plastic, non-slip stool. Keep the floor dry. Clean up any water that spills on the floor as soon as it happens. Remove soap buildup in the tub or shower regularly. Attach bath mats securely with double-sided non-slip rug tape. Do not have throw rugs and other things on the floor that can make you trip. What can I do in the bedroom? Use night lights. Make sure that you have a light by your bed that is easy to reach. Do not use any sheets or blankets that are too big for your bed. They should not hang down onto the floor. Have a firm chair that has side arms. You can use this for support while you get dressed. Do not have throw rugs and other things on the floor that can make you trip. What can I do in the kitchen? Clean up any spills right away. Avoid walking on wet floors. Keep items that you use a lot  in easy-to-reach places. If you need to reach something above you, use a strong step stool that has a grab bar. Keep electrical cords out of the way. Do not use floor polish or wax that makes floors slippery. If you must use wax, use non-skid floor wax. Do not have throw rugs and other things on the floor that can make you trip. What can I do with my stairs? Do not leave any items on the stairs. Make sure that there are handrails on both sides of the stairs and use them. Fix handrails that are broken or loose. Make sure that handrails are as long as the stairways. Check any carpeting to make sure that it is firmly attached to the stairs. Fix any carpet that is loose or worn. Avoid having throw rugs at the top or bottom of the stairs. If you do have throw rugs, attach them to the floor with carpet tape. Make sure that you have a light switch at the top of the stairs and the bottom of the stairs. If  you do not have them, ask someone to add them for you. What else can I do to help prevent falls? Wear shoes that: Do not have high heels. Have rubber bottoms. Are comfortable and fit you well. Are closed at the toe. Do not wear sandals. If you use a stepladder: Make sure that it is fully opened. Do not climb a closed stepladder. Make sure that both sides of the stepladder are locked into place. Ask someone to hold it for you, if possible. Clearly mark and make sure that you can see: Any grab bars or handrails. First and last steps. Where the edge of each step is. Use tools that help you move around (mobility aids) if they are needed. These include: Canes. Walkers. Scooters. Crutches. Turn on the lights when you go into a dark area. Replace any light bulbs as soon as they burn out. Set up your furniture so you have a clear path. Avoid moving your furniture around. If any of your floors are uneven, fix them. If there are any pets around you, be aware of where they are. Review your medicines with your doctor. Some medicines can make you feel dizzy. This can increase your chance of falling. Ask your doctor what other things that you can do to help prevent falls. This information is not intended to replace advice given to you by your health care provider. Make sure you discuss any questions you have with your health care provider. Document Released: 10/18/2008 Document Revised: 05/30/2015 Document Reviewed: 01/26/2014 Elsevier Interactive Patient Education  2017 [employer].

## 2022-03-11 NOTE — Telephone Encounter (Signed)
Contacted Shane Robinson to schedule their annual wellness visit. Appointment made for 03/12/2022.  Thank you,  Colletta [place],  [facility] Direct Dial ??[phone]

## 2022-03-11 NOTE — Progress Notes (Unsigned)
Subjective:   Shane Robinson is a 72 y.o. male who presents for Medicare Annual/Subsequent preventive examination.  Review of Systems    ***       Objective:    There were no vitals filed for this visit. There is no height or weight on file to calculate BMI.     04/01/2021    8:48 PM 02/27/2021   10:07 AM 07/21/2017    6:43 AM 07/15/2017   11:25 AM  Advanced Directives  Does Patient Have a Medical Advance Directive? No Yes Yes Yes  Type of Corporate treasurer of [place];Living will [place];Living will [place];Living will  Does patient want to make changes to medical advance directive?   No - Patient declined No - Patient declined  Copy of [place] in Chart?  Yes - validated most recent copy scanned in chart (See row information) No - copy requested No - copy requested  Would patient like information on creating a medical advance directive? No - Patient declined No - Patient declined      Current Medications (verified) Outpatient Encounter Medications as of 03/12/2022  Medication Sig   amoxicillin-clavulanate (AUGMENTIN) 875-125 MG tablet Take 1 tablet by mouth 2 (two) times daily.   benazepril (LOTENSIN) 20 MG tablet Take 1 tablet by mouth once daily   ibuprofen (ADVIL,MOTRIN) 200 MG tablet Take 600 mg by mouth every 6 (six) hours as needed for headache or moderate pain.   rosuvastatin (CRESTOR) 20 MG tablet Take 1 tablet by mouth once daily   No facility-administered encounter medications on file as of 03/12/2022.    Allergies (verified) Latex   History: Past Medical History:  Diagnosis Date   Allergy    Elevated lipids    History of kidney stones    X 2 PASSED ON OWN   Hyperlipidemia    Hypertension    Lumbar pars defect    Pulmonary nodules    Thoracic aortic aneurysm without rupture    STABLE 4.1 CM STABLE PER DR BATRLE 05-26-17 EPIC NOTE   Past Surgical History:  Procedure Laterality  Date   HERNIA REPAIR  1975   INGUINAL   MENISCUS REPAIR RIGHT KNEE  2016   SHOULDER SURGERY Right 2002   ROTATOR CUFF    TOTAL KNEE ARTHROPLASTY Right 07/21/2017   Procedure: RIGHT TOTAL KNEE ARTHROPLASTY, PERTIAL PATELLECTOMY, REPAIR QUADRICEPS TENDON;  Surgeon: Susa Day, MD;  Location: [facility];  Service: Orthopedics;  Laterality: Right;  120 mins   Family History  Problem Relation Age of Onset   Cancer Mother        stomach   Heart disease Father    Stroke Father    Social History   Socioeconomic History   Marital status: Married    Spouse name: Not on file   Number of children: Not on file   Years of education: Not on file   Highest education level: Not on file  Occupational History   Not on file  Tobacco Use   Smoking status: Never   Smokeless tobacco: Never  Vaping Use   Vaping Use: Never used  Substance and Sexual Activity   Alcohol use: Yes    Comment: occ   Drug use: No   Sexual activity: Not on file    Comment: married, dairy farmer  Other Topics Concern   Not on file  Social History Narrative   Not on file   Social Determinants of  Health   Financial Resource Strain: Low Risk  (02/27/2021)   Overall Financial Resource Strain (CARDIA)    Difficulty of Paying Living Expenses: Not hard at all  Food Insecurity: No Food Insecurity (02/27/2021)   Hunger Vital Sign    Worried About Running Out of Food in the Last Year: Never true    Ran Out of Food in the Last Year: Never true  Transportation Needs: No Transportation Needs (02/27/2021)   PRAPARE - Hydrologist (Medical): No    Lack of Transportation (Non-Medical): No  Physical Activity: Inactive (02/27/2021)   Exercise Vital Sign    Days of Exercise per Week: 0 days    Minutes of Exercise per Session: 0 min  Stress: No Stress Concern Present (02/27/2021)   [place]    Feeling of Stress : Not at all  Social  Connections: [place] (02/27/2021)   Social Connection and Isolation Panel [NHANES]    Frequency of Communication with Friends and Family: More than three times a week    Frequency of Social Gatherings with Friends and Family: More than three times a week    Attends Religious Services: More than 4 times per year    Active Member of [employer] or Organizations: Yes    Attends Music therapist: More than 4 times per year    Marital Status: Married    Tobacco Counseling Counseling given: Not Answered   Clinical Intake:                 Diabetic?No          Activities of Daily Living     No data to display          Patient Care Team: Susy Frizzle, MD as PCP - General (Family Medicine)  Indicate any recent Medical Services you may have received from other than [facility] providers in the past year (date may be approximate).     Assessment:   This is a routine wellness examination for [name]  Hearing/Vision screen No results found.  Dietary issues and exercise activities discussed:     Goals Addressed   None    Depression Screen    02/27/2021   10:04 AM 06/18/2017    3:48 PM 06/10/2016    7:59 AM  PHQ 2/9 Scores  PHQ - 2 Score 0 0 0  PHQ- 9 Score   0    Fall Risk    02/27/2021   10:07 AM 06/18/2017    3:48 PM 06/10/2016    7:59 AM  Fall Risk   Falls in the past year? 1 No No  Number falls in past yr: 1    Injury with Fall? 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    Follow up Falls prevention discussed      FALL RISK PREVENTION PERTAINING TO THE HOME:  Any stairs in or around the home? {YES/NO:21197} If so, are there any without handrails? {YES/NO:21197} Home free of loose throw rugs in walkways, pet beds, electrical cords, etc? {YES/NO:21197} Adequate lighting in your home to reduce risk of falls? {YES/NO:21197}  ASSISTIVE DEVICES  UTILIZED TO PREVENT FALLS:  Life alert? {YES/NO:21197} Use of a cane, walker or w/c? {YES/NO:21197} Grab bars in the bathroom? {YES/NO:21197} Shower chair or bench in shower? {YES/NO:21197} Elevated toilet seat or  a handicapped toilet? {YES/NO:21197}  TIMED UP AND GO:  Was the test performed? No . Telephonic visit   Cognitive Function:        02/27/2021   10:09 AM  6CIT Screen  What Year? 0 points  What month? 0 points  What time? 0 points  Count back from 20 0 points  Months in reverse 0 points  Repeat phrase 0 points  Total Score 0 points    Immunizations Immunization History  Administered Date(s) Administered   Fluad Quad(high Dose 65+) 09/23/2018   Influenza Whole 10/05/2008   Influenza, High Dose Seasonal PF 10/22/2016   Influenza,inj,Quad PF,6+ Mos 12/27/2012, 09/26/2019, 12/24/2020   Influenza,inj,quad, With Preservative 11/05/2016   PFIZER(Purple Top)SARS-COV-2 Vaccination 01/28/2019, 02/18/2019   Pneumococcal Polysaccharide-23 01/14/2021   Td 01/05/2005   Tdap 10/22/2016    TDAP status: Up to date  Flu Vaccine status: Up to date  {Pneumococcal vaccine status:2101807}  Covid-19 vaccine status: Information provided on how to obtain vaccines.   Qualifies for Shingles Vaccine? Yes   Zostavax completed No   Shingrix Completed?: No.    Education has been provided regarding the importance of this vaccine. Patient has been advised to call insurance company to determine out of pocket expense if they have not yet received this vaccine. Advised may also receive vaccine at local pharmacy or Health Dept. Verbalized acceptance and understanding.  Screening Tests Health Maintenance  Topic Date Due   Zoster Vaccines- Shingrix (1 of 2) Never done   COVID-19 Vaccine (3 - Pfizer risk series) 03/18/2019   INFLUENZA VACCINE  08/05/2021   Pneumonia Vaccine 65+ Years old (2 of 2 - PCV) 01/14/2022   Medicare Annual Wellness (AWV)  02/27/2022   Fecal DNA (Cologuard)   11/11/2024   DTaP/Tdap/Td (3 - Td or Tdap) 10/23/2026   Hepatitis C Screening  Completed   HPV VACCINES  Aged Out   COLONOSCOPY (Pts 45-46yr Insurance coverage will need to be confirmed)  Discontinued    Health Maintenance  Health Maintenance Due  Topic Date Due   Zoster Vaccines- Shingrix (1 of 2) Never done   COVID-19 Vaccine (3 - Pfizer risk series) 03/18/2019   INFLUENZA VACCINE  08/05/2021   Pneumonia Vaccine 71 Years old (2 of 2 - PCV) 01/14/2022   Medicare Annual Wellness (AWV)  02/27/2022    Colorectal cancer screening: Type of screening: Cologuard. Completed 11/11/21. Repeat every 3 years  Lung Cancer Screening: (Low Dose CT Chest recommended if Age 71-80years, 30 pack-year currently smoking OR have quit w/in 15years.) does not qualify.   Lung Cancer Screening Referral: n/a  Additional Screening:  Hepatitis C Screening: does qualify; Completed 11/13/16  Vision Screening: Recommended annual ophthalmology exams for early detection of glaucoma and other disorders of the eye. Is the patient up to date with their annual eye exam?  {YES/NO:21197} Who is the provider or what is the name of the office in which the patient attends annual eye exams? *** If pt is not established with a provider, would they like to be referred to a provider to establish care? {YES/NO:21197}.   Dental Screening: Recommended annual dental exams for proper oral hygiene  Community Resource Referral / Chronic Care Management: CRR required this visit?  {YES/NO:21197}  CCM required this visit?  {YES/NO:21197}     Plan:     I have personally reviewed and noted the following in the patient's chart:   Medical and social history Use of alcohol, tobacco or illicit drugs  Current medications and  supplements including opioid prescriptions. {Opioid [phone]} Functional ability and status Nutritional status Physical activity Advanced directives List of other  physicians Hospitalizations, surgeries, and ER visits in previous 12 months Vitals Screenings to include cognitive, depression, and falls Referrals and appointments  In addition, I have reviewed and discussed with patient certain preventive protocols, quality metrics, and best practice recommendations. A written personalized care plan for preventive services as well as general preventive health recommendations were provided to patient.     Vanetta Mulders, [place]   [id number]   Due to this being a virtual visit, the after visit summary with patients personalized plan was offered to patient via mail or my-chart. ***Patient declined at this time./ Patient would like to access on my-chart/ per request, patient was mailed a copy of AVS./ Patient preferred to pick up at office at next visit   Nurse Notes: ***

## 2022-03-12 ENCOUNTER — Ambulatory Visit (INDEPENDENT_AMBULATORY_CARE_PROVIDER_SITE_OTHER): Payer: Medicare PPO

## 2022-03-12 VITALS — Ht 69.0 in | Wt 228.0 lb

## 2022-03-12 DIAGNOSIS — Z0000 Encounter for general adult medical examination without abnormal findings: Secondary | ICD-10-CM

## 2022-03-17 ENCOUNTER — Telehealth: Payer: Self-pay

## 2022-03-17 ENCOUNTER — Telehealth: Payer: Self-pay | Admitting: Family Medicine

## 2022-03-17 NOTE — Telephone Encounter (Signed)
Pt's wife called and had a + Covid test today. Shane Robinson asks if Paxlovid can be sent in? Thank you!

## 2022-03-17 NOTE — Telephone Encounter (Signed)
Pt's wife called back and asked if husband can get Paxlovid. Advised pt's wife that pt will need an appt for the Paxlovid. Pt's wife stated she will call back to schedule.

## 2022-03-17 NOTE — Telephone Encounter (Signed)
Patient has tested positive for covid and wife would like to know if you will call in the covid medication.  CB# [phone]

## 2022-03-18 ENCOUNTER — Telehealth: Payer: Self-pay | Admitting: Family Medicine

## 2022-03-18 ENCOUNTER — Other Ambulatory Visit: Payer: Self-pay | Admitting: Family Medicine

## 2022-03-18 DIAGNOSIS — U071 COVID-19: Secondary | ICD-10-CM

## 2022-03-18 MED ORDER — NIRMATRELVIR/RITONAVIR (PAXLOVID)TABLET: 3.0000 | ORAL_TABLET | Freq: Two times a day (BID) | ORAL | 0 refills | Status: AC

## 2022-03-18 NOTE — Telephone Encounter (Signed)
Amber, could you send in [facility] for Mr. Koppelman? Dr. Dennard Schaumann did not see this message at the end of the day yesterday. Thank you!

## 2022-03-18 NOTE — Telephone Encounter (Signed)
Patient's wife confirmed pharmacy as  [facility] [address], [place] - [place] [address] [place].[address] [place] [address] Phone: [phone]  Fax: [phone]   She's requesting a call back when script sent to pharmacy.   Please advise at [phone].

## 2022-03-19 ENCOUNTER — Other Ambulatory Visit: Payer: Self-pay | Admitting: Family Medicine

## 2022-03-19 MED ORDER — NIRMATRELVIR/RITONAVIR (PAXLOVID)TABLET: 3.0000 | ORAL_TABLET | Freq: Two times a day (BID) | ORAL | 0 refills | Status: AC

## 2022-03-24 NOTE — Telephone Encounter (Signed)
Erroneous encounter. Please disregard.

## 2022-04-13 ENCOUNTER — Other Ambulatory Visit: Payer: Self-pay | Admitting: Family Medicine

## 2022-04-15 ENCOUNTER — Other Ambulatory Visit: Payer: Self-pay | Admitting: Surgery

## 2022-04-15 DIAGNOSIS — I7121 Aneurysm of the ascending aorta, without rupture: Secondary | ICD-10-CM

## 2022-04-29 ENCOUNTER — Other Ambulatory Visit: Payer: Self-pay | Admitting: Family Medicine

## 2022-06-03 ENCOUNTER — Ambulatory Visit: Payer: Medicare PPO | Admitting: Surgery

## 2022-06-03 ENCOUNTER — Ambulatory Visit
Admission: RE | Admit: 2022-06-03 | Discharge: 2022-06-03 | Disposition: A | Discharge status: Home / Self Care | Payer: Medicare PPO | Source: Ambulatory Visit | Attending: Surgery | Admitting: Surgery

## 2022-06-03 ENCOUNTER — Encounter: Payer: Self-pay | Admitting: Surgery

## 2022-06-03 VITALS — BP 143/87 | HR 61 | Resp 18 | Ht 69.0 in | Wt 223.0 lb

## 2022-06-03 DIAGNOSIS — I7121 Aneurysm of the ascending aorta, without rupture: Secondary | ICD-10-CM | POA: Diagnosis not present

## 2022-06-03 DIAGNOSIS — R918 Other nonspecific abnormal finding of lung field: Secondary | ICD-10-CM | POA: Diagnosis not present

## 2022-06-03 DIAGNOSIS — I10 Essential (primary) hypertension: Secondary | ICD-10-CM | POA: Diagnosis not present

## 2022-06-03 MED ORDER — IOPAMIDOL (ISOVUE-370) INJECTION 76%
75.0000 mL | Freq: Once | INTRAVENOUS | Status: AC | PRN
  Administered 2022-06-03: 75 mL via INTRAVENOUS

## 2022-06-03 NOTE — Progress Notes (Signed)
HPI:  The patient is a 72 year old gentleman who returns for follow-up of a small ascending aortic aneurysm that is very to measurement on serial studies from 3.7 to 4.1 cm dating back to 2017.  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  His wife came with him today.  Current Outpatient Medications  Medication Sig Dispense Refill   benazepril (LOTENSIN) 20 MG tablet Take 1 tablet by mouth once daily 90 tablet 0   ibuprofen (ADVIL,MOTRIN) 200 MG tablet Take 600 mg by mouth every 6 (six) hours as needed for headache or moderate pain.     rosuvastatin (CRESTOR) 20 MG tablet TAKE 1 TABLET BY MOUTH ONCE DAILY . APPOINTMENT REQUIRED FOR FUTURE REFILLS 90 tablet 0   No current facility-administered medications for this visit.     Physical Exam: BP (!) 143/87   Pulse 61   Resp 18   Ht 5\' 9"  (1.753 m)   Wt 223 lb (101.2 kg)   SpO2 94% Comment: RA  BMI 32.93 kg/m  He looks well. Cardiac exam shows regular rate and rhythm with normal heart sounds.  There is no murmur. Lungs are clear.   Diagnostic Tests:  Narrative & Impression  CLINICAL DATA:  Aortic aneurysm suspected.  Hypertension.   EXAM: CT ANGIOGRAPHY CHEST WITH CONTRAST   TECHNIQUE: Multidetector CT imaging of the chest was performed using the standard protocol during bolus administration of intravenous contrast. Multiplanar CT image reconstructions and MIPs were obtained to evaluate the vascular anatomy.   RADIATION DOSE REDUCTION: This exam was performed according to the departmental dose-optimization program which includes automated exposure control, adjustment of the mA and/or kV according to patient size and/or use of iterative reconstruction technique.   CONTRAST:  75mL ISOVUE-370 IOPAMIDOL (ISOVUE-370) INJECTION 76%   COMPARISON:  CT angiogram chest abdomen and pelvis 04/01/2021. CT chest 06/01/2018   FINDINGS: Cardiovascular: Satisfactory opacification of the pulmonary  arteries to the segmental level. No evidence of pulmonary embolism. Normal heart size. No pericardial effusion.   Mediastinum/Nodes: No enlarged mediastinal, hilar, or axillary lymph nodes. Thyroid gland, trachea, and esophagus demonstrate no significant findings.   Lungs/Pleura: Scattered benign bilateral noncalcified pulmonary nodules measure up to 7 mm. No new or suspicious pulmonary nodules are identified. The lungs are otherwise clear. There is no pleural effusion or pneumothorax.   Upper Abdomen: No acute abnormality.   Musculoskeletal: Degenerative changes affect the shoulders.   Review of the MIP images confirms the above findings.   IMPRESSION: 1. No evidence of pulmonary embolism or other acute cardiopulmonary process. 2. No evidence of thoracic aortic aneurysm or dissection. 3. Scattered benign bilateral pulmonary nodules measure up to 7 mm. No follow-up recommended.     Electronically Signed   By: Darliss Cheney M.D.   On: 06/03/2022 13:57      Impression:  This 72 year old gentleman has a mildly enlarged aorta that has measured 3.7 to 4.1 cm on serial scans dating back to 2017 when I first saw him.  I personally reviewed reviewed and measured his aorta on the current CT scan and I measured about 4.1 cm.  This is well below the surgical threshold.  I reviewed the CTA images with him and his wife and answered their questions.  The scattered benign bilateral pulmonary nodules are unchanged.  I stressed the importance of continued good blood pressure control in preventing further enlargement of his aorta and acute aortic dissection.  Plan:  I will see him  back in 2 years with a CTA of the chest for aortic surveillance.  I spent 15 minutes performing this established patient evaluation and > 50% of this time was spent face to face counseling and coordinating the care of this patient's aortic aneurysm.    Alleen Borne, MD [facility] [phone]

## 2022-06-15 ENCOUNTER — Other Ambulatory Visit: Payer: Self-pay | Admitting: Family Medicine

## 2022-06-16 NOTE — Telephone Encounter (Signed)
Requested medications are due for refill today.  yes  Requested medications are on the active medications list.  yes  Last refill. 04/29/2022 #90 0 rf  Future visit scheduled.   no  Notes to clinic.  Pt last seen 04/21/2021, Labs are expired.    Requested Prescriptions  Pending Prescriptions Disp Refills   rosuvastatin (CRESTOR) 20 MG tablet [Pharmacy Med Name: Rosuvastatin Calcium 20 MG Oral Tablet] 90 tablet 0    Sig: TAKE 1 TABLET BY MOUTH ONCE DAILY . APPOINTMENT REQUIRED FOR FUTURE REFILLS     Cardiovascular:  Antilipid - Statins 2 Failed - 06/15/2022  7:22 AM      Failed - Cr in normal range and within 360 days    Creat  Date Value Ref Range Status  01/15/2021 1.11 0.70 - 1.35 mg/dL Final   Creatinine, Ser  Date Value Ref Range Status  04/01/2021 0.90 0.61 - 1.24 mg/dL Final         Failed - Valid encounter within last 12 months    Recent Outpatient Visits           1 year ago Closed fracture of multiple ribs of right side with routine healing, subsequent encounter   [facility] Pickard, Priscille Heidelberg, MD   1 year ago Essential hypertension   [facility] Tanya Nones, Priscille Heidelberg, MD   2 years ago Need for immunization against influenza   [facility] Donita Brooks, MD   3 years ago Elevated blood sugar   [facility] Tanya Nones, Priscille Heidelberg, MD   4 years ago Right leg swelling   [facility] Tanya Nones, Priscille Heidelberg, MD              Failed - Lipid Panel in normal range within the last 12 months    Cholesterol  Date Value Ref Range Status  01/15/2021 111 <200 mg/dL Final   LDL Cholesterol (Calc)  Date Value Ref Range Status  01/15/2021 56 mg/dL (calc) Final    Comment:    Reference range: <100 . Desirable range <100 mg/dL for primary prevention;   <70 mg/dL for patients with CHD or diabetic patients  with > or = 2 CHD risk factors. Marland Kitchen LDL-C is now calculated using the Martin-Hopkins   calculation, which is a validated novel method providing  better accuracy than the Friedewald equation in the  estimation of LDL-C.  Horald Pollen et al. Lenox Ahr. 2956;213(08): 2061-2068  (http://education.[facility].com/faq/FAQ164)    HDL  Date Value Ref Range Status  01/15/2021 38 (L) > OR = 40 mg/dL Final   Triglycerides  Date Value Ref Range Status  01/15/2021 83 <150 mg/dL Final         Passed - Patient is not pregnant

## 2022-06-17 ENCOUNTER — Telehealth: Payer: Self-pay

## 2022-06-17 NOTE — Telephone Encounter (Signed)
Pt's wife called in to ask for a courtesy refill of this med rosuvastatin (CRESTOR) 20 MG tablet [[id number]]. Pt's wife states that pt is completely out of this med. Pt's wife was informed that pt needed an OV with pcp before any refills could be sent. Pt is scheduled to see pcp 06/22/22. Pt will need just enough meds to last until this appt please.   Cb#: [phone]

## 2022-06-18 ENCOUNTER — Other Ambulatory Visit: Payer: Medicare PPO

## 2022-06-18 DIAGNOSIS — E783 Hyperchylomicronemia: Secondary | ICD-10-CM

## 2022-06-18 DIAGNOSIS — Z125 Encounter for screening for malignant neoplasm of prostate: Secondary | ICD-10-CM | POA: Diagnosis not present

## 2022-06-18 DIAGNOSIS — I10 Essential (primary) hypertension: Secondary | ICD-10-CM | POA: Diagnosis not present

## 2022-06-22 ENCOUNTER — Encounter: Payer: Self-pay | Admitting: Family Medicine

## 2022-06-22 ENCOUNTER — Ambulatory Visit: Payer: Medicare PPO | Admitting: Family Medicine

## 2022-06-22 VITALS — BP 128/72 | HR 78 | Temp 98.8°F | Ht 69.0 in | Wt 224.0 lb

## 2022-06-22 DIAGNOSIS — Z125 Encounter for screening for malignant neoplasm of prostate: Secondary | ICD-10-CM

## 2022-06-22 DIAGNOSIS — E7800 Pure hypercholesterolemia, unspecified: Secondary | ICD-10-CM | POA: Diagnosis not present

## 2022-06-22 DIAGNOSIS — I10 Essential (primary) hypertension: Secondary | ICD-10-CM | POA: Diagnosis not present

## 2022-06-22 NOTE — Progress Notes (Signed)
Subjective:    Patient ID: Shane Robinson, male    DOB: March 26, 1950, 72 y.o.   MRN: [id number]  Patient has a history of hypertension hyperlipidemia.  His blood pressure today is well-controlled.  He has a history of coronary artery calcification seen on CT scan of his chest in 2022.  He denies any chest pain.  He denies any angina.  He denies any shortness of breath or dyspnea on exertion.  He has a history of a thoracic aneurysm.  This is being monitored by vascular surgery.  He has been cleared follow-up in 2 years due to stability on the CT scan.  Overall he is doing well except for chronic right knee pain.  His most recent lab work is listed below.  He is due for prostate cancer screening, Prevnar 20, and Shingrix. Lab on 06/18/2022  Component Date Value Ref Range Status   WBC 06/18/2022 6.9  3.8 - 10.8 Thousand/uL Final   RBC 06/18/2022 4.64  4.20 - 5.80 Million/uL Final   Hemoglobin 06/18/2022 14.1  13.2 - 17.1 g/dL Final   HCT 40/98/1191 42.7  38.5 - 50.0 % Final   MCV 06/18/2022 92.0  80.0 - 100.0 fL Final   MCH 06/18/2022 30.4  27.0 - 33.0 pg Final   MCHC 06/18/2022 33.0  32.0 - 36.0 g/dL Final   RDW 47/82/9562 12.1  11.0 - 15.0 % Final   Platelets 06/18/2022 187  140 - 400 Thousand/uL Final   MPV 06/18/2022 11.9  7.5 - 12.5 fL Final   Neutro Abs 06/18/2022 3,533  1,500 - 7,800 cells/uL Final   Lymphs Abs 06/18/2022 2,456  850 - 3,900 cells/uL Final   Absolute Monocytes 06/18/2022 628  200 - 950 cells/uL Final   Eosinophils Absolute 06/18/2022 221  15 - 500 cells/uL Final   Basophils Absolute 06/18/2022 62  0 - 200 cells/uL Final   Neutrophils Relative % 06/18/2022 51.2  % Final   Total Lymphocyte 06/18/2022 35.6  % Final   Monocytes Relative 06/18/2022 9.1  % Final   Eosinophils Relative 06/18/2022 3.2  % Final   Basophils Relative 06/18/2022 0.9  % Final   Glucose, Bld 06/18/2022 88  65 - 99 mg/dL Final   Comment: .            Fasting reference interval .    BUN  06/18/2022 21  7 - 25 mg/dL Final   Creat 13/08/6576 1.06  0.70 - 1.28 mg/dL Final   eGFR 46/96/2952 75  > OR = 60 mL/min/1.73m2 Final   BUN/Creatinine Ratio 06/18/2022 SEE NOTE:  6 - 22 (calc) Final   Comment:    Not Reported: BUN and Creatinine are within    reference range. .    Sodium 06/18/2022 139  135 - 146 mmol/L Final   Potassium 06/18/2022 4.4  3.5 - 5.3 mmol/L Final   Chloride 06/18/2022 106  98 - 110 mmol/L Final   CO2 06/18/2022 26  20 - 32 mmol/L Final   Calcium 06/18/2022 8.7  8.6 - 10.3 mg/dL Final   Total Protein 84/13/2440 6.9  6.1 - 8.1 g/dL Final   Albumin 11/01/2534 4.0  3.6 - 5.1 g/dL Final   Globulin 64/40/3474 2.9  1.9 - 3.7 g/dL (calc) Final   AG Ratio 06/18/2022 1.4  1.0 - 2.5 (calc) Final   Total Bilirubin 06/18/2022 0.4  0.2 - 1.2 mg/dL Final   Alkaline phosphatase (APISO) 06/18/2022 50  35 - 144 U/L Final   AST 06/18/2022  26  10 - 35 U/L Final   ALT 06/18/2022 26  9 - 46 U/L Final   Cholesterol 06/18/2022 106  <200 mg/dL Final   HDL 08/65/7846 38 (L)  > OR = 40 mg/dL Final   Triglycerides 96/29/5284 90  <150 mg/dL Final   LDL Cholesterol (Calc) 06/18/2022 51  mg/dL (calc) Final   Comment: Reference range: <100 . Desirable range <100 mg/dL for primary prevention;   <70 mg/dL for patients with CHD or diabetic patients  with > or = 2 CHD risk factors. Marland Kitchen LDL-C is now calculated using the Martin-Hopkins  calculation, which is a validated novel method providing  better accuracy than the Friedewald equation in the  estimation of LDL-C.  Horald Pollen et al. Lenox Ahr. 1324;401(02): 2061-2068  (http://education.[facility].com/faq/FAQ164)    Total CHOL/HDL Ratio 06/18/2022 2.8  <7.2 (calc) Final   Non-HDL Cholesterol (Calc) 06/18/2022 68  <130 mg/dL (calc) Final   Comment: For patients with diabetes plus 1 major ASCVD risk  factor, treating to a non-HDL-C goal of <100 mg/dL  (LDL-C of <53 mg/dL) is considered a therapeutic  option.     Past Medical  History:  Diagnosis Date   Allergy    Elevated lipids    History of kidney stones    X 2 PASSED ON OWN   Hyperlipidemia    Hypertension    Lumbar pars defect    Pulmonary nodules    Thoracic aortic aneurysm without rupture (HCC)    STABLE 4.1 CM STABLE PER DR BATRLE 05-26-17 EPIC NOTE   Past Surgical History:  Procedure Laterality Date   HERNIA REPAIR  1975   INGUINAL   MENISCUS REPAIR RIGHT KNEE  2016   SHOULDER SURGERY Right 2002   ROTATOR CUFF    TOTAL KNEE ARTHROPLASTY Right 07/21/2017   Procedure: RIGHT TOTAL KNEE ARTHROPLASTY, PERTIAL PATELLECTOMY, REPAIR QUADRICEPS TENDON;  Surgeon: Jene Every, MD;  Location: [facility];  Service: Orthopedics;  Laterality: Right;  120 mins   Current Outpatient Medications on File Prior to Visit  Medication Sig Dispense Refill   benazepril (LOTENSIN) 20 MG tablet Take 1 tablet by mouth once daily 90 tablet 0   rosuvastatin (CRESTOR) 20 MG tablet TAKE 1 TABLET BY MOUTH ONCE DAILY . APPOINTMENT REQUIRED FOR FUTURE REFILLS 90 tablet 1   No current facility-administered medications on file prior to visit.   Allergies  Allergen Reactions   Latex Hives   Social History   Socioeconomic History   Marital status: Married    Spouse name: Not on file   Number of children: Not on file   Years of education: Not on file   Highest education level: Not on file  Occupational History   Not on file  Tobacco Use   Smoking status: Never   Smokeless tobacco: Never  Vaping Use   Vaping Use: Never used  Substance and Sexual Activity   Alcohol use: Yes    Comment: occ   Drug use: No   Sexual activity: Not on file    Comment: married, dairy farmer  Other Topics Concern   Not on file  Social History Narrative   Not on file   Social Determinants of Health   Financial Resource Strain: Low Risk  (03/12/2022)   Overall Financial Resource Strain (CARDIA)    Difficulty of Paying Living Expenses: Not hard at all  Food Insecurity: No Food Insecurity  (03/12/2022)   Hunger Vital Sign    Worried About Running Out of Food in the  Last Year: Never true    Ran Out of Food in the Last Year: Never true  Transportation Needs: No Transportation Needs (03/12/2022)   PRAPARE - Administrator, Civil Service (Medical): No    Lack of Transportation (Non-Medical): No  Physical Activity: Sufficiently Active (03/12/2022)   Exercise Vital Sign    Days of Exercise per Week: 5 days    Minutes of Exercise per Session: 30 min  Stress: No Stress Concern Present (03/12/2022)   [employer] of Occupational Health - Occupational Stress Questionnaire    Feeling of Stress : Not at all  Social Connections: Socially Integrated (03/12/2022)   Social Connection and Isolation Panel [NHANES]    Frequency of Communication with Friends and Family: More than three times a week    Frequency of Social Gatherings with Friends and Family: More than three times a week    Attends Religious Services: More than 4 times per year    Active Member of [employer] or Organizations: Yes    Attends Banker Meetings: More than 4 times per year    Marital Status: Married  Catering manager Violence: Not At Risk (03/12/2022)   Humiliation, Afraid, Rape, and Kick questionnaire    Fear of Current or Ex-Partner: No    Emotionally Abused: No    Physically Abused: No    Sexually Abused: No      Review of Systems  All other systems reviewed and are negative.      Objective:   Physical Exam Vitals reviewed.  Cardiovascular:     Rate and Rhythm: Normal rate and regular rhythm.     Heart sounds: Normal heart sounds.  Pulmonary:     Effort: Pulmonary effort is normal. No respiratory distress.     Breath sounds: Normal breath sounds. No wheezing or rales.  Abdominal:     General: Bowel sounds are normal. There is no distension.     Palpations: Abdomen is soft.     Tenderness: There is no abdominal tenderness. There is no rebound.  Neurological:     Mental Status: He  is alert.     Motor: No abnormal muscle tone.     Deep Tendon Reflexes: Reflexes are normal and symmetric.          Assessment & Plan:  Essential hypertension  Prostate cancer screening  Pure hypercholesterolemia Blood pressure and cholesterol are outstanding.  Recommended Shingrix.  Recommended Prevnar 20.  I will have the lab add a PSA to screen for prostate cancer.  His last Cologuard was in 2023 and is up-to-date.

## 2022-06-23 DIAGNOSIS — Z96651 Presence of right artificial knee joint: Secondary | ICD-10-CM | POA: Diagnosis not present

## 2022-06-23 LAB — CBC WITH DIFFERENTIAL/PLATELET
Absolute Monocytes: 628 cells/uL (ref 200–950)
Basophils Absolute: 62 cells/uL (ref 0–200)
Basophils Relative: 0.9 %
Eosinophils Absolute: 221 cells/uL (ref 15–500)
Eosinophils Relative: 3.2 %
HCT: 42.7 % (ref 38.5–50.0)
Hemoglobin: 14.1 g/dL (ref 13.2–17.1)
Lymphs Abs: 2456 cells/uL (ref 850–3900)
MCH: 30.4 pg (ref 27.0–33.0)
MCHC: 33 g/dL (ref 32.0–36.0)
MCV: 92 fL (ref 80.0–100.0)
MPV: 11.9 fL (ref 7.5–12.5)
Monocytes Relative: 9.1 %
Neutro Abs: 3533 cells/uL (ref 1500–7800)
Neutrophils Relative %: 51.2 %
Platelets: 187 10*3/uL (ref 140–400)
RBC: 4.64 10*6/uL (ref 4.20–5.80)
RDW: 12.1 % (ref 11.0–15.0)
Total Lymphocyte: 35.6 %
WBC: 6.9 10*3/uL (ref 3.8–10.8)

## 2022-06-23 LAB — COMPLETE METABOLIC PANEL WITH GFR
AG Ratio: 1.4 (calc) (ref 1.0–2.5)
ALT: 26 U/L (ref 9–46)
AST: 26 U/L (ref 10–35)
Albumin: 4 g/dL (ref 3.6–5.1)
Alkaline phosphatase (APISO): 50 U/L (ref 35–144)
BUN: 21 mg/dL (ref 7–25)
CO2: 26 mmol/L (ref 20–32)
Calcium: 8.7 mg/dL (ref 8.6–10.3)
Chloride: 106 mmol/L (ref 98–110)
Creat: 1.06 mg/dL (ref 0.70–1.28)
Globulin: 2.9 g/dL (calc) (ref 1.9–3.7)
Glucose, Bld: 88 mg/dL (ref 65–99)
Potassium: 4.4 mmol/L (ref 3.5–5.3)
Sodium: 139 mmol/L (ref 135–146)
Total Bilirubin: 0.4 mg/dL (ref 0.2–1.2)
Total Protein: 6.9 g/dL (ref 6.1–8.1)
eGFR: 75 mL/min/{1.73_m2} (ref >=60)

## 2022-06-23 LAB — LIPID PANEL
Cholesterol: 106 mg/dL (ref <200)
HDL: 38 mg/dL — ABNORMAL LOW (ref >=40)
LDL Cholesterol (Calc): 51 mg/dL (calc)
Non-HDL Cholesterol (Calc): 68 mg/dL (calc) (ref <130)
Total CHOL/HDL Ratio: 2.8 (calc) (ref <5.0)
Triglycerides: 90 mg/dL (ref <150)

## 2022-06-23 LAB — TEST AUTHORIZATION

## 2022-06-23 LAB — PSA: PSA: 0.43 ng/mL (ref <=4.00)

## 2022-06-24 ENCOUNTER — Other Ambulatory Visit: Payer: Self-pay | Admitting: Family Medicine

## 2022-06-24 NOTE — Telephone Encounter (Signed)
OV 06/22/22 Requested Prescriptions  Pending Prescriptions Disp Refills   benazepril (LOTENSIN) 20 MG tablet [Pharmacy Med Name: Benazepril HCl 20 MG Oral Tablet] 90 tablet 1    Sig: Take 1 tablet by mouth once daily     Cardiovascular:  ACE Inhibitors Failed - 06/24/2022 11:52 AM      Failed - Valid encounter within last 6 months    Recent Outpatient Visits           1 year ago Closed fracture of multiple ribs of right side with routine healing, subsequent encounter   [facility] Pickard, Priscille Heidelberg, MD   1 year ago Essential hypertension   [facility] Tanya Nones, Priscille Heidelberg, MD   2 years ago Need for immunization against influenza   [facility] Tanya Nones, Priscille Heidelberg, MD   3 years ago Elevated blood sugar   [facility] Donita Brooks, MD   4 years ago Right leg swelling   [facility] Tanya Nones, Priscille Heidelberg, MD              Passed - Cr in normal range and within 180 days    Creat  Date Value Ref Range Status  06/18/2022 1.06 0.70 - 1.28 mg/dL Final         Passed - K in normal range and within 180 days    Potassium  Date Value Ref Range Status  06/18/2022 4.4 3.5 - 5.3 mmol/L Final         Passed - Patient is not pregnant      Passed - Last BP in normal range    BP Readings from Last 1 Encounters:  06/22/22 128/72

## 2022-06-25 DIAGNOSIS — Z96659 Presence of unspecified artificial knee joint: Secondary | ICD-10-CM | POA: Diagnosis not present

## 2022-07-01 ENCOUNTER — Other Ambulatory Visit (HOSPITAL_COMMUNITY): Payer: Self-pay | Admitting: Specialist

## 2022-07-01 DIAGNOSIS — Z96659 Presence of unspecified artificial knee joint: Secondary | ICD-10-CM

## 2022-07-13 ENCOUNTER — Encounter (HOSPITAL_COMMUNITY): Payer: Medicare PPO

## 2022-07-13 ENCOUNTER — Encounter (HOSPITAL_COMMUNITY): Payer: Self-pay

## 2022-07-13 ENCOUNTER — Encounter (HOSPITAL_COMMUNITY): Admission: RE | Admit: 2022-07-13 | Payer: Medicare PPO | Source: Ambulatory Visit

## 2022-07-16 ENCOUNTER — Encounter (HOSPITAL_COMMUNITY)
Admission: RE | Admit: 2022-07-16 | Discharge: 2022-07-16 | Disposition: A | Discharge status: Home / Self Care | Payer: Medicare PPO | Source: Ambulatory Visit | Attending: Specialist | Admitting: Specialist

## 2022-07-16 DIAGNOSIS — Z96659 Presence of unspecified artificial knee joint: Secondary | ICD-10-CM | POA: Insufficient documentation

## 2022-07-16 DIAGNOSIS — Z96652 Presence of left artificial knee joint: Secondary | ICD-10-CM | POA: Diagnosis not present

## 2022-07-16 MED ORDER — TECHNETIUM TC 99M MEDRONATE IV KIT
20.0000 | PACK | Freq: Once | INTRAVENOUS | Status: AC | PRN
  Administered 2022-07-16: 20 via INTRAVENOUS

## 2022-07-27 DIAGNOSIS — M5136 Other intervertebral disc degeneration, lumbar region: Secondary | ICD-10-CM | POA: Diagnosis not present

## 2022-07-27 DIAGNOSIS — Z96659 Presence of unspecified artificial knee joint: Secondary | ICD-10-CM | POA: Diagnosis not present

## 2022-07-27 DIAGNOSIS — M25561 Pain in right knee: Secondary | ICD-10-CM | POA: Diagnosis not present

## 2022-07-27 DIAGNOSIS — Z96651 Presence of right artificial knee joint: Secondary | ICD-10-CM | POA: Diagnosis not present

## 2022-08-07 ENCOUNTER — Encounter (HOSPITAL_BASED_OUTPATIENT_CLINIC_OR_DEPARTMENT_OTHER): Payer: Self-pay

## 2022-08-07 ENCOUNTER — Other Ambulatory Visit: Payer: Self-pay

## 2022-08-07 ENCOUNTER — Emergency Department (HOSPITAL_BASED_OUTPATIENT_CLINIC_OR_DEPARTMENT_OTHER): Payer: Medicare PPO | Admitting: Radiology

## 2022-08-07 ENCOUNTER — Emergency Department (HOSPITAL_BASED_OUTPATIENT_CLINIC_OR_DEPARTMENT_OTHER)
Admission: EM | Admit: 2022-08-07 | Discharge: 2022-08-07 | Disposition: A | Discharge status: Home / Self Care | Payer: Medicare PPO | Attending: Emergency Medicine | Admitting: Emergency Medicine

## 2022-08-07 DIAGNOSIS — Y9273 Farm field as the place of occurrence of the external cause: Secondary | ICD-10-CM | POA: Insufficient documentation

## 2022-08-07 DIAGNOSIS — Y990 Civilian activity done for income or pay: Secondary | ICD-10-CM | POA: Insufficient documentation

## 2022-08-07 DIAGNOSIS — S8391XA Sprain of unspecified site of right knee, initial encounter: Secondary | ICD-10-CM | POA: Insufficient documentation

## 2022-08-07 DIAGNOSIS — Z79899 Other long term (current) drug therapy: Secondary | ICD-10-CM | POA: Diagnosis not present

## 2022-08-07 DIAGNOSIS — M85861 Other specified disorders of bone density and structure, right lower leg: Secondary | ICD-10-CM | POA: Diagnosis not present

## 2022-08-07 DIAGNOSIS — X501XXA Overexertion from prolonged static or awkward postures, initial encounter: Secondary | ICD-10-CM | POA: Insufficient documentation

## 2022-08-07 DIAGNOSIS — Z91040 Latex allergy status: Secondary | ICD-10-CM | POA: Insufficient documentation

## 2022-08-07 DIAGNOSIS — I10 Essential (primary) hypertension: Secondary | ICD-10-CM | POA: Diagnosis not present

## 2022-08-07 DIAGNOSIS — Z96651 Presence of right artificial knee joint: Secondary | ICD-10-CM | POA: Insufficient documentation

## 2022-08-07 DIAGNOSIS — M25461 Effusion, right knee: Secondary | ICD-10-CM | POA: Diagnosis not present

## 2022-08-07 DIAGNOSIS — M25561 Pain in right knee: Secondary | ICD-10-CM | POA: Diagnosis not present

## 2022-08-07 DIAGNOSIS — S8991XA Unspecified injury of right lower leg, initial encounter: Secondary | ICD-10-CM | POA: Diagnosis present

## 2022-08-07 DIAGNOSIS — S8390XA Sprain of unspecified site of unspecified knee, initial encounter: Secondary | ICD-10-CM

## 2022-08-07 NOTE — ED Notes (Signed)
 RN reviewed discharge instructions with pt. Pt verbalized understanding and had no further questions. VSS upon discharge.  

## 2022-08-07 NOTE — ED Triage Notes (Signed)
Pt presents with R knee pain after a fall yesterday.

## 2022-08-07 NOTE — Discharge Instructions (Signed)
Follow-up with the orthopedic doctor.  Recommend 400 mg ibuprofen every 8 hours as needed for pain.  Recommend 1000 mg of Tylenol every 6 hours as needed for pain.  Use Ace wrap for compression.  Recommend ice 10 minutes on 10 minutes off several times a day.

## 2022-08-07 NOTE — ED Provider Notes (Signed)
[facility] EMERGENC[facility] DEPARTMENT AT [facility] Provider Note   CSN: [id number] Arrival date & time: 08/07/22  1426     History  Chief Complaint  Patient presents with   Knee Injury    Shane Robinson is a 72 y.o. male.  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  History of right knee replacement.  Just finished a course of steroids for inflammation in this knee recently.  History of hypertension high cholesterol.  He denies any chest pain.  Not hit his head or lose consciousness.  He has been able to ambulate without much issues but does have discomfort.  The history is provided by the patient.       Home Medications Prior to Admission medications   Medication Sig Start Date End Date Taking? Authorizing Provider  benazepril (LOTENSIN) 20 MG tablet Take 1 tablet by mouth once daily 06/24/22   Donita Brooks, MD  rosuvastatin (CRESTOR) 20 MG tablet TAKE 1 TABLET BY MOUTH ONCE DAILY . APPOINTMENT REQUIRED FOR FUTURE REFILLS 06/17/22   Donita Brooks, MD      Allergies    Latex    Review of Systems   Review of Systems  Physical Exam Updated Vital Signs BP (!) 141/79 (BP Location: Right Arm)   Pulse 72   Temp 98.3 F (36.8 C)   Resp 16   Ht 5\' 10"  (1.778 m)   Wt 103.4 kg   SpO2 94%   BMI 32.71 kg/m  Physical Exam Vitals and nursing note reviewed.  Constitutional:      General: He is not in acute distress.    Appearance: He is well-developed. He is not ill-appearing.  HENT:     Head: Normocephalic and atraumatic.     Nose: Nose normal.     Mouth/Throat:     Mouth: Mucous membranes are moist.  Eyes:     Extraocular Movements: Extraocular movements intact.     Conjunctiva/sclera: Conjunctivae normal.     Pupils: Pupils are equal, round, and reactive to light.  Cardiovascular:     Rate and Rhythm: Normal rate and regular rhythm.     Heart sounds: No murmur heard. Pulmonary:      Effort: Pulmonary effort is normal. No respiratory distress.     Breath sounds: Normal breath sounds.  Abdominal:     Palpations: Abdomen is soft.     Tenderness: There is no abdominal tenderness.  Musculoskeletal:        General: Swelling and tenderness present. Normal range of motion.     Cervical back: Normal range of motion and neck supple.     Comments: Tenderness to the right knee but pretty good range of motion without much discomfort, mild swelling around the knee but no obvious laxity of the right knee joint  Skin:    General: Skin is warm and dry.     Capillary Refill: Capillary refill takes less than 2 seconds.  Neurological:     General: No focal deficit present.     Mental Status: He is alert and oriented to person, place, and time.     Cranial Nerves: No cranial nerve deficit.     Sensory: No sensory deficit.     Motor: No weakness.     Coordination: Coordination normal.     Comments: 5+ out of 5 strength in the lower extremities  Psychiatric:        Mood  and Affect: Mood normal.     ED Results / Procedures / Treatments   Labs (all labs ordered are listed, but only abnormal results are displayed) Labs Reviewed - No data to display  EKG None  Radiology DG Knee Complete 4 Views Right  Result Date: 08/07/2022 CLINICAL DATA:  Pain after injury. EXAM: RIGHT KNEE - COMPLETE 4 VIEW COMPARISON:  None Available. FINDINGS: Total knee arthroplasty identified. Cemented tibial component. Press-Fit femoral component. Patellar button. Small joint effusion noted. Osteopenia. No fracture or dislocation. No definite hardware failure. Scattered vascular calcifications are seen. IMPRESSION: Changes of prior total knee arthroplasty.  Small joint effusion. Electronically Signed   By: Karen Kays M.D.   On: 08/07/2022 15:23    Procedures Procedures    Medications Ordered in ED Medications - No data to display  ED Course/ Medical Decision Making/ A&P                                  Medical Decision Making Amount and/or Complexity of Data Reviewed Radiology: ordered.   Rosezena Sensor is here with right knee pain after fall yesterday.  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  History of joint replacement in this knee.  Overall he is neuromuscular neurovascular intact.  No obvious laxity in the joint.  He actually just had a arthrocentesis and steroids last few weeks.  Was doing a lot better and felt really good but stepped down funny from water trough and twisted the right knee.  X-ray per radiology report does not show any fracture or malalignment.  Hardware is intact.  He has a small joint effusion.  Overall I suspect the sprain knee.  Recommend ice, Tylenol, ibuprofen.  Gave him an Ace wrap.  I offered him crutches but he declined.  Recommend follow-up with his orthopedics.  Discharged in good condition.  This chart was dictated using voice recognition software.  Despite best efforts to proofread,  errors can occur which can change the documentation meaning.         Final Clinical Impression(s) / ED Diagnoses Final diagnoses:  Sprain of knee, unspecified laterality, unspecified ligament, initial encounter    Rx / DC Orders ED Discharge Orders     None         Virgina Norfolk, DO 08/07/22 1534

## 2022-08-10 ENCOUNTER — Other Ambulatory Visit (HOSPITAL_COMMUNITY): Payer: Self-pay | Admitting: Specialist

## 2022-08-10 DIAGNOSIS — M25561 Pain in right knee: Secondary | ICD-10-CM | POA: Diagnosis not present

## 2022-08-10 DIAGNOSIS — M25562 Pain in left knee: Secondary | ICD-10-CM | POA: Diagnosis not present

## 2022-08-10 DIAGNOSIS — M79661 Pain in right lower leg: Secondary | ICD-10-CM | POA: Diagnosis not present

## 2022-08-10 DIAGNOSIS — R52 Pain, unspecified: Secondary | ICD-10-CM

## 2022-08-11 ENCOUNTER — Ambulatory Visit (HOSPITAL_COMMUNITY)
Admission: RE | Admit: 2022-08-11 | Discharge: 2022-08-11 | Disposition: A | Discharge status: Home / Self Care | Payer: Medicare PPO | Source: Ambulatory Visit | Attending: Vascular Surgery | Admitting: Vascular Surgery

## 2022-08-11 DIAGNOSIS — R52 Pain, unspecified: Secondary | ICD-10-CM | POA: Insufficient documentation

## 2022-08-14 ENCOUNTER — Telehealth: Payer: Self-pay

## 2022-08-14 NOTE — Telephone Encounter (Signed)
Transition Care Management Unsuccessful Follow-up Telephone Call  Date of discharge and from where:  [facility] 8/2  Attempts:  2nd Attempt  Reason for unsuccessful TCM follow-up call:  No answer/busy   Lenard Forth [facility] Guide, [facility] [phone] [address] [place], [place], [place] [address] Phone: [phone] Email: [email or web address]

## 2022-08-14 NOTE — Telephone Encounter (Signed)
Transition Care Management Unsuccessful Follow-up Telephone Call  Date of discharge and from where:  [facility] 8/2  Attempts:  1st Attempt  Reason for unsuccessful TCM follow-up call:  No answer/busy   Lenard Forth [facility] Guide, [facility] [phone] [address] [place], [place], [place] [address] Phone: [phone] Email: [email or web address]

## 2022-08-17 DIAGNOSIS — Z96651 Presence of right artificial knee joint: Secondary | ICD-10-CM | POA: Diagnosis not present

## 2022-08-17 DIAGNOSIS — T84032D Mechanical loosening of internal right knee prosthetic joint, subsequent encounter: Secondary | ICD-10-CM | POA: Diagnosis not present

## 2022-08-18 DIAGNOSIS — H5203 Hypermetropia, bilateral: Secondary | ICD-10-CM | POA: Diagnosis not present

## 2022-08-31 DIAGNOSIS — M25562 Pain in left knee: Secondary | ICD-10-CM | POA: Diagnosis not present

## 2022-09-21 DIAGNOSIS — M25562 Pain in left knee: Secondary | ICD-10-CM | POA: Diagnosis not present

## 2022-09-21 DIAGNOSIS — T84032D Mechanical loosening of internal right knee prosthetic joint, subsequent encounter: Secondary | ICD-10-CM | POA: Diagnosis not present

## 2022-09-23 DIAGNOSIS — M25561 Pain in right knee: Secondary | ICD-10-CM | POA: Diagnosis not present

## 2022-09-23 DIAGNOSIS — M25562 Pain in left knee: Secondary | ICD-10-CM | POA: Diagnosis not present

## 2022-09-29 DIAGNOSIS — Z96651 Presence of right artificial knee joint: Secondary | ICD-10-CM | POA: Diagnosis not present

## 2022-09-29 DIAGNOSIS — T84032D Mechanical loosening of internal right knee prosthetic joint, subsequent encounter: Secondary | ICD-10-CM | POA: Diagnosis not present

## 2022-10-08 DIAGNOSIS — M25512 Pain in left shoulder: Secondary | ICD-10-CM | POA: Diagnosis not present

## 2022-11-06 ENCOUNTER — Ambulatory Visit: Payer: Medicare PPO

## 2022-11-16 ENCOUNTER — Other Ambulatory Visit: Payer: Self-pay

## 2022-11-16 ENCOUNTER — Telehealth: Payer: Self-pay

## 2022-11-16 DIAGNOSIS — E7800 Pure hypercholesterolemia, unspecified: Secondary | ICD-10-CM

## 2022-11-16 MED ORDER — ROSUVASTATIN CALCIUM 20 MG PO TABS
ORAL_TABLET | ORAL | 0 refills | Status: DC
Start: 2022-11-16 — End: 2023-05-20

## 2022-11-16 NOTE — Telephone Encounter (Signed)
Prescription Request  11/16/2022  LOV:6/17/  What is the name of the medication or equipment? rosuvastatin (CRESTOR) 20 MG tablet [[id number]]  Have you contacted your pharmacy to request a refill? Yes   Which pharmacy would you like this sent to?  [facility] [address], [place] - [address] [place] [address] Phone: [phone] Fax: [phone]    Patient notified that their request is being sent to the clinical staff for review and that they should receive a response within 2 business days.   Please advise at [facility] [phone]

## 2022-11-17 DIAGNOSIS — Z96651 Presence of right artificial knee joint: Secondary | ICD-10-CM | POA: Diagnosis not present

## 2022-12-15 DIAGNOSIS — M1712 Unilateral primary osteoarthritis, left knee: Secondary | ICD-10-CM | POA: Diagnosis not present

## 2023-01-06 ENCOUNTER — Other Ambulatory Visit: Payer: Self-pay | Admitting: Family Medicine

## 2023-01-07 DIAGNOSIS — L570 Actinic keratosis: Secondary | ICD-10-CM | POA: Diagnosis not present

## 2023-01-07 DIAGNOSIS — C44319 Basal cell carcinoma of skin of other parts of face: Secondary | ICD-10-CM | POA: Diagnosis not present

## 2023-01-07 DIAGNOSIS — Z85828 Personal history of other malignant neoplasm of skin: Secondary | ICD-10-CM | POA: Diagnosis not present

## 2023-01-07 DIAGNOSIS — D485 Neoplasm of uncertain behavior of skin: Secondary | ICD-10-CM | POA: Diagnosis not present

## 2023-01-07 DIAGNOSIS — L905 Scar conditions and fibrosis of skin: Secondary | ICD-10-CM | POA: Diagnosis not present

## 2023-01-12 DIAGNOSIS — M25812 Other specified joint disorders, left shoulder: Secondary | ICD-10-CM | POA: Diagnosis not present

## 2023-01-25 ENCOUNTER — Telehealth: Payer: Self-pay

## 2023-01-25 NOTE — Telephone Encounter (Signed)
Copied from CRM [id number]. Topic: General - Other >> Jan 25, 2023  1:52 PM Gildardo Pounds wrote: Reason for CRM: Patient needs medical clearance so that he can have his surgery for shoulder by Dr Ranell Patrick. The request for clearance was sent from Dr Norris's office. Callback number is [phone]

## 2023-01-26 ENCOUNTER — Telehealth: Payer: Self-pay

## 2023-01-26 NOTE — Telephone Encounter (Signed)
Copied from CRM [id number]. Topic: Medical Record Request - Other >> Jan 26, 2023 10:31 AM Thomes Dinning wrote: Reason for CRM: Keri from [facility] called stating they recv'd the pre auth clearance form for the patient but it did not include all the required info. Lorina Rabon is requesting the patient last office visit notes be sent to fax # [phone]

## 2023-01-28 ENCOUNTER — Other Ambulatory Visit: Payer: Self-pay | Admitting: Family Medicine

## 2023-01-28 MED ORDER — AZITHROMYCIN 250 MG PO TABS: ORAL_TABLET | ORAL | 0 refills | Status: DC

## 2023-01-29 ENCOUNTER — Other Ambulatory Visit: Payer: Self-pay

## 2023-01-29 ENCOUNTER — Ambulatory Visit: Payer: Medicare PPO | Admitting: Family Medicine

## 2023-01-29 VITALS — BP 136/82 | HR 90 | Temp 98.4°F | Ht 70.0 in | Wt 226.6 lb

## 2023-01-29 DIAGNOSIS — I499 Cardiac arrhythmia, unspecified: Secondary | ICD-10-CM

## 2023-01-29 MED ORDER — AMOXICILLIN-POT CLAVULANATE 875-125 MG PO TABS: 1.0000 | ORAL_TABLET | Freq: Two times a day (BID) | ORAL | 0 refills | Status: DC

## 2023-01-29 NOTE — Progress Notes (Signed)
Subjective:    Patient ID: Shane Robinson, male    DOB: Nov 02, 1950, 73 y.o.   MRN: [id number]  Patient has a history of hypertension and hyperlipidemia.  He is here for surgical clearance.  Planning to have left shoulder surgery.  During our exam today, his heart rhythm was found to be irregular.  He has been battling bronchitis for the last week.  He denies any chest pain, dyspnea on exertion, pleurisy, orthopnea, or hemoptysis.  Labs June 2024 were normal.  No evidence of anemia, etc. Blood pressure today is acceptable at 136/82.   Past Medical History:  Diagnosis Date   Allergy    Elevated lipids    History of kidney stones    X 2 PASSED ON OWN   Hyperlipidemia    Hypertension    Lumbar pars defect    Pulmonary nodules    Thoracic aortic aneurysm without rupture (HCC)    STABLE 4.1 CM STABLE PER DR BATRLE 05-26-17 EPIC NOTE   Past Surgical History:  Procedure Laterality Date   HERNIA REPAIR  1975   INGUINAL   MENISCUS REPAIR RIGHT KNEE  2016   SHOULDER SURGERY Right 2002   ROTATOR CUFF    TOTAL KNEE ARTHROPLASTY Right 07/21/2017   Procedure: RIGHT TOTAL KNEE ARTHROPLASTY, PERTIAL PATELLECTOMY, REPAIR QUADRICEPS TENDON;  Surgeon: Jene Every, MD;  Location: [facility];  Service: Orthopedics;  Laterality: Right;  120 mins   Current Outpatient Medications on File Prior to Visit  Medication Sig Dispense Refill   azithromycin (ZITHROMAX) 250 MG tablet 2 tabs poqday1, 1 tab poqday 2-5 6 tablet 0   benazepril (LOTENSIN) 20 MG tablet Take 1 tablet by mouth once daily 90 tablet 0   rosuvastatin (CRESTOR) 20 MG tablet TAKE 1 TABLET BY MOUTH ONCE DAILY 90 tablet 0   No current facility-administered medications on file prior to visit.   Allergies  Allergen Reactions   Latex Hives        Review of Systems  All other systems reviewed and are negative.      Objective:   Physical Exam Vitals reviewed.  Constitutional:      Appearance: Normal appearance. He is normal weight.   HENT:     Nose: Congestion present.  Cardiovascular:     Rate and Rhythm: Normal rate. Rhythm irregular.     Heart sounds: Normal heart sounds. No murmur heard.    No friction rub. No gallop.  Pulmonary:     Effort: Pulmonary effort is normal. No respiratory distress.     Breath sounds: Normal breath sounds. No wheezing, rhonchi or rales.  Chest:     Chest wall: No tenderness.  Abdominal:     General: Bowel sounds are normal. There is no distension.     Palpations: Abdomen is soft.     Tenderness: There is no abdominal tenderness. There is no rebound.  Musculoskeletal:     Right lower leg: No edema.     Left lower leg: No edema.  Neurological:     General: No focal deficit present.     Mental Status: He is alert and oriented to person, place, and time.     Motor: No abnormal muscle tone.     Deep Tendon Reflexes: Reflexes are normal and symmetric.          Assessment & Plan:  Irregular cardiac rhythm - Plan: EKG 12-Lead Blood pressure and cholesterol are outstanding.  Repeat cbc and cmp to rule out any preoperative anemia.  Patient's EKG today shows normal sinus rhythm but he is having frequent PVCs and PACs.  Patient is completely asymptomatic.  I believe that this is related to his illness.  I am going to extend his antibiotic coverage as the patient does have some faint right basilar crackles.  Add Augmentin to his Z-Pak to cover possible early walking pneumonia.  Check CBC and CMP to evaluate for leukocytosis.  Reassess next week.  Assuming his cough is getting better, I feel that the patient would then be clinically cleared to proceed with surgery after resolution of his illness.

## 2023-01-30 LAB — CBC WITH DIFFERENTIAL/PLATELET
Absolute Lymphocytes: 2435 {cells}/uL (ref 850–3900)
Absolute Monocytes: 724 {cells}/uL (ref 200–950)
Basophils Absolute: 32 {cells}/uL (ref 0–200)
Basophils Relative: 0.6 %
Eosinophils Absolute: 103 {cells}/uL (ref 15–500)
Eosinophils Relative: 1.9 %
HCT: 43.8 % (ref 38.5–50.0)
Hemoglobin: 14.4 g/dL (ref 13.2–17.1)
MCH: 30.7 pg (ref 27.0–33.0)
MCHC: 32.9 g/dL (ref 32.0–36.0)
MCV: 93.4 fL (ref 80.0–100.0)
MPV: 12.1 fL (ref 7.5–12.5)
Monocytes Relative: 13.4 %
Neutro Abs: 2106 {cells}/uL (ref 1500–7800)
Neutrophils Relative %: 39 %
Platelets: 173 10*3/uL (ref 140–400)
RBC: 4.69 10*6/uL (ref 4.20–5.80)
RDW: 12 % (ref 11.0–15.0)
Total Lymphocyte: 45.1 %
WBC: 5.4 10*3/uL (ref 3.8–10.8)

## 2023-01-30 LAB — COMPLETE METABOLIC PANEL WITH GFR
AG Ratio: 1.4 (calc) (ref 1.0–2.5)
ALT: 33 U/L (ref 9–46)
AST: 35 U/L (ref 10–35)
Albumin: 4 g/dL (ref 3.6–5.1)
Alkaline phosphatase (APISO): 51 U/L (ref 35–144)
BUN: 22 mg/dL (ref 7–25)
CO2: 28 mmol/L (ref 20–32)
Calcium: 9 mg/dL (ref 8.6–10.3)
Chloride: 105 mmol/L (ref 98–110)
Creat: 1.07 mg/dL (ref 0.70–1.28)
Globulin: 2.9 g/dL (ref 1.9–3.7)
Glucose, Bld: 94 mg/dL (ref 65–99)
Potassium: 4.1 mmol/L (ref 3.5–5.3)
Sodium: 141 mmol/L (ref 135–146)
Total Bilirubin: 0.5 mg/dL (ref 0.2–1.2)
Total Protein: 6.9 g/dL (ref 6.1–8.1)
eGFR: 74 mL/min/{1.73_m2} (ref >=60)

## 2023-02-02 NOTE — Telephone Encounter (Unsigned)
Copied from CRM [id number]. Topic: Clinical - Lab/Test Results >> Feb 02, 2023  4:23 PM Gildardo Pounds wrote: Reason for CRM: Patient called for lab results. Read the note from Dr Tanya Nones. Patient states that he couldn't tell his heart was skipping beats until you told him. Does he send information on lab results to surgeon that all is clear or do you? Callback number is [phone]

## 2023-02-08 ENCOUNTER — Telehealth: Payer: Self-pay

## 2023-02-08 NOTE — Telephone Encounter (Signed)
Copied from CRM [id number]. Topic: General - Other >> Feb 05, 2023 11:43 AM Dennison Nancy wrote: Reason for CRM: patient saw  Elijah Birk last week  did clearance for patient to have surgery  and want to know will the lab results be sent over to the surgeon or do patient have to give the lab results to surgeon himself , need a response soon as possible so can schedule his surgery.

## 2023-02-22 DIAGNOSIS — Z85828 Personal history of other malignant neoplasm of skin: Secondary | ICD-10-CM | POA: Diagnosis not present

## 2023-02-22 DIAGNOSIS — C44319 Basal cell carcinoma of skin of other parts of face: Secondary | ICD-10-CM | POA: Diagnosis not present

## 2023-03-18 ENCOUNTER — Ambulatory Visit (INDEPENDENT_AMBULATORY_CARE_PROVIDER_SITE_OTHER): Payer: Medicare PPO | Admitting: *Deleted

## 2023-03-18 DIAGNOSIS — Z0000 Encounter for general adult medical examination without abnormal findings: Secondary | ICD-10-CM

## 2023-03-18 NOTE — Progress Notes (Signed)
 Subjective:   Shane Robinson is a 73 y.o. male who presents for Medicare Annual/Subsequent preventive examination.  Visit Complete: Virtual I connected with  Shane Robinson on 03/18/23 by a audio enabled telemedicine application and verified that I am speaking with the correct person using two identifiers.  Patient Location: Home  Provider Location: Home Office  I discussed the limitations of evaluation and management by telemedicine. The patient expressed understanding and agreed to proceed.  Vital Signs: Because this visit was a virtual/telehealth visit, some criteria may be missing or patient reported. Any vitals not documented were not able to be obtained and vitals that have been documented are patient reported.         Objective:    There were no vitals filed for this visit. There is no height or weight on file to calculate BMI.     08/07/2022    2:33 PM 03/12/2022    9:31 AM 04/01/2021    8:48 PM 02/27/2021   10:07 AM 07/21/2017    6:43 AM 07/15/2017   11:25 AM  Advanced Directives  Does Patient Have a Medical Advance Directive? Yes Yes No Yes Yes Yes  Type of Estate agent of [place];Living will Living will;Healthcare Power of [employer] Power of [place];Living will Healthcare Power of [place];Living will Healthcare Power of [place];Living will  Does patient want to make changes to medical advance directive?  No - Patient declined   No - Patient declined No - Patient declined  Copy of Healthcare Power of Attorney in Chart?  Yes - validated most recent copy scanned in chart (See row information)  Yes - validated most recent copy scanned in chart (See row information) No - copy requested No - copy requested  Would patient like information on creating a medical advance directive?   No - Patient declined No - Patient declined      Current Medications (verified) Outpatient Encounter Medications as of 03/18/2023  Medication Sig    amoxicillin -clavulanate (AUGMENTIN ) 875-125 MG tablet Take 1 tablet by mouth 2 (two) times daily.   azithromycin  (ZITHROMAX ) 250 MG tablet 2 tabs poqday1, 1 tab poqday 2-5   benazepril  (LOTENSIN ) 20 MG tablet Take 1 tablet by mouth once daily   rosuvastatin  (CRESTOR ) 20 MG tablet TAKE 1 TABLET BY MOUTH ONCE DAILY   No facility-administered encounter medications on file as of 03/18/2023.    Allergies (verified) Latex   History: Past Medical History:  Diagnosis Date   Allergy    Elevated lipids    History of kidney stones    X 2 PASSED ON OWN   Hyperlipidemia    Hypertension    Lumbar pars defect    Pulmonary nodules    Thoracic aortic aneurysm without rupture (HCC)    STABLE 4.1 CM STABLE PER DR BATRLE 05-26-17 EPIC NOTE   Past Surgical History:  Procedure Laterality Date   HERNIA REPAIR  1975   INGUINAL   MENISCUS REPAIR RIGHT KNEE  2016   SHOULDER SURGERY Right 2002   ROTATOR CUFF    TOTAL KNEE ARTHROPLASTY Right 07/21/2017   Procedure: RIGHT TOTAL KNEE ARTHROPLASTY, PERTIAL PATELLECTOMY, REPAIR QUADRICEPS TENDON;  Surgeon: Duwayne Purchase, MD;  Location: [facility];  Service: Orthopedics;  Laterality: Right;  120 mins   Family History  Problem Relation Age of Onset   Cancer Mother        stomach   Heart disease Father    Stroke Father    Social History  Socioeconomic History   Marital status: Married    Spouse name: Not on file   Number of children: Not on file   Years of education: Not on file   Highest education level: Not on file  Occupational History   Not on file  Tobacco Use   Smoking status: Never   Smokeless tobacco: Never  Vaping Use   Vaping status: Never Used  Substance and Sexual Activity   Alcohol use: Yes    Comment: occ   Drug use: No   Sexual activity: Not on file    Comment: married, dairy farmer  Other Topics Concern   Not on file  Social History Narrative   Not on file   Social Drivers of Health   Financial Resource Strain: Low Risk   (03/12/2022)   Overall Financial Resource Strain (CARDIA)    Difficulty of Paying Living Expenses: Not hard at all  Food Insecurity: No Food Insecurity (03/12/2022)   Hunger Vital Sign    Worried About Running Out of Food in the Last Year: Never true    Ran Out of Food in the Last Year: Never true  Transportation Needs: No Transportation Needs (03/12/2022)   PRAPARE - Administrator, Civil Service (Medical): No    Lack of Transportation (Non-Medical): No  Physical Activity: Sufficiently Active (03/12/2022)   Exercise Vital Sign    Days of Exercise per Week: 5 days    Minutes of Exercise per Session: 30 min  Stress: No Stress Concern Present (03/12/2022)   [employer] of Occupational Health - Occupational Stress Questionnaire    Feeling of Stress : Not at all  Social Connections: Socially Integrated (03/12/2022)   Social Connection and Isolation Panel [NHANES]    Frequency of Communication with Friends and Family: More than three times a week    Frequency of Social Gatherings with Friends and Family: More than three times a week    Attends Religious Services: More than 4 times per year    Active Member of [employer] or Organizations: Yes    Attends Engineer, structural: More than 4 times per year    Marital Status: Married    Tobacco Counseling Counseling given: Not Answered   Clinical Intake:                        Activities of Daily Living     No data to display          Patient Care Team: Duanne Butler DASEN, MD as PCP - General (Family Medicine) Lauralee Chew, MD as Referring Physician (Otolaryngology)  Indicate any recent Medical Services you may have received from other than [facility] providers in the past year (date may be approximate).     Assessment:   This is a routine wellness examination for Shane Robinson.  Hearing/Vision screen No results found.   Goals Addressed   None    Depression Screen    03/12/2022    9:30 AM 02/27/2021    10:04 AM 06/18/2017    3:48 PM 06/10/2016    7:59 AM  PHQ 2/9 Scores  PHQ - 2 Score 0 0 0 0  PHQ- 9 Score    0    Fall Risk    03/12/2022    9:30 AM 02/27/2021   10:07 AM 06/18/2017    3:48 PM 06/10/2016    7:59 AM  Fall Risk   Falls in the past year? 0 1 No No  Number  falls in past yr: 0 1    Injury with Fall? 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    Follow up Falls prevention discussed;Education provided;Falls evaluation completed Falls prevention discussed      MEDICARE RISK AT HOME:    TIMED UP AND GO:  Was the test performed?  No    Cognitive Function:        03/12/2022    9:32 AM 02/27/2021   10:09 AM  6CIT Screen  What Year? 0 points 0 points  What month? 0 points 0 points  What time? 0 points 0 points  Count back from 20 0 points 0 points  Months in reverse 0 points 0 points  Repeat phrase 0 points 0 points  Total Score 0 points 0 points    Immunizations Immunization History  Administered Date(s) Administered   Fluad Quad(high Dose 65+) 09/23/2018   Influenza Whole 10/05/2008   Influenza, High Dose Seasonal PF 10/22/2016   Influenza,inj,Quad PF,6+ Mos 12/27/2012, 09/26/2019, 12/24/2020   Influenza,inj,quad, With Preservative 11/05/2016   Influenza-Unspecified 10/08/2021   PFIZER(Purple Top)SARS-COV-2 Vaccination 01/28/2019, 02/18/2019   Pneumococcal Polysaccharide-23 01/14/2021   Td 01/05/2005   Tdap 10/22/2016   Unspecified SARS-COV-2 Vaccination 10/12/2021    TDAP status: Up to date  Flu Vaccine status: Due, Education has been provided regarding the importance of this vaccine. Advised may receive this vaccine at local pharmacy or Health Dept. Aware to provide a copy of the vaccination record if obtained from local pharmacy or Health Dept. Verbalized acceptance and understanding.  Pneumococcal vaccine status: Up to date  Covid-19 vaccine status:  Information provided on how to obtain vaccines.   Qualifies for Shingles Vaccine? Yes   Zostavax completed No   Shingrix Completed?: No.    Education has been provided regarding the importance of this vaccine. Patient has been advised to call insurance company to determine out of pocket expense if they have not yet received this vaccine. Advised may also receive vaccine at local pharmacy or Health Dept. Verbalized acceptance and understanding.  Screening Tests Health Maintenance  Topic Date Due   Zoster Vaccines- Shingrix (1 of 2) Never done   Pneumonia Vaccine 25+ Years old (2 of 2 - PCV) 01/14/2022   INFLUENZA VACCINE  08/06/2022   COVID-19 Vaccine (4 - 2024-25 season) 09/06/2022   Medicare Annual Wellness (AWV)  03/12/2023   Fecal DNA (Cologuard)  11/11/2024   DTaP/Tdap/Td (3 - Td or Tdap) 10/23/2026   Hepatitis C Screening  Completed   HPV VACCINES  Aged Out   Colonoscopy  Discontinued    Health Maintenance  Health Maintenance Due  Topic Date Due   Zoster Vaccines- Shingrix (1 of 2) Never done   Pneumonia Vaccine 20+ Years old (2 of 2 - PCV) 01/14/2022   INFLUENZA VACCINE  08/06/2022   COVID-19 Vaccine (4 - 2024-25 season) 09/06/2022   Medicare Annual Wellness (AWV)  03/12/2023      Lung Cancer Screening: (Low Dose CT Chest recommended if Age 75-80 years, 20 pack-year currently smoking OR have quit w/in 15years.) does not qualify.   Lung Cancer Screening Referral:   Additional Screening:  Hepatitis C Screening: does not qualify; Completed   Vision Screening: Recommended annual ophthalmology exams for early detection of glaucoma and other disorders of the eye. Is the patient up to date with their annual eye  exam?   Who is the provider or what is the name of the office in which the patient attends annual eye exams?  If pt is not established with a provider, would they like to be referred to a provider to establish care? .   Dental Screening: Recommended annual dental  exams for proper oral hy  Community Resource Referral / Chronic Care Management: CRR required this visit?    CCM required this visit?       Plan:     I have personally reviewed and noted the following in the patient's chart:   Medical and social history Use of alcohol, tobacco or illicit drugs  Current medications and supplements including opioid prescriptions.  Functional ability and status Nutritional status Physical activity Advanced directives List of other physicians Hospitalizations, surgeries, and ER visits in previous 12 months Vitals Screenings to include cognitive, depression, and falls Referrals and appointments  In addition, I have reviewed and discussed with patient certain preventive protocols, quality metrics, and best practice recommendations. A written personalized care plan for preventive services as well as general preventive health recommendations were provided to patient.     Mliss Graff, LPN   6/86/7974   After Visit Summary: (MyChart) Due to this being a telephonic visit, the after visit summary with patients personalized plan was offered to patient via MyChart   Nurse Notes:

## 2023-03-22 DIAGNOSIS — G8918 Other acute postprocedural pain: Secondary | ICD-10-CM | POA: Diagnosis not present

## 2023-03-22 DIAGNOSIS — S46012A Strain of muscle(s) and tendon(s) of the rotator cuff of left shoulder, initial encounter: Secondary | ICD-10-CM | POA: Diagnosis not present

## 2023-03-22 DIAGNOSIS — M19012 Primary osteoarthritis, left shoulder: Secondary | ICD-10-CM | POA: Diagnosis not present

## 2023-03-22 HISTORY — PX: TOTAL SHOULDER ARTHROPLASTY: SHX126

## 2023-04-06 DIAGNOSIS — Z96659 Presence of unspecified artificial knee joint: Secondary | ICD-10-CM | POA: Diagnosis not present

## 2023-04-06 DIAGNOSIS — Z4789 Encounter for other orthopedic aftercare: Secondary | ICD-10-CM | POA: Diagnosis not present

## 2023-04-10 ENCOUNTER — Other Ambulatory Visit: Payer: Self-pay | Admitting: Family Medicine

## 2023-04-12 NOTE — Telephone Encounter (Signed)
 Requested medication (s) are due for refill today: yes   Requested medication (s) are on the active medication list: yes   Last refill:  01/07/23 #90 0 refills   Future visit scheduled: no   Notes to clinic:  no refills remain. Do you want to refill Rx?     Requested Prescriptions  Pending Prescriptions Disp Refills   benazepril (LOTENSIN) 20 MG tablet [Pharmacy Med Name: Benazepril HCl 20 MG Oral Tablet] 90 tablet 0    Sig: Take 1 tablet by mouth once daily     Cardiovascular:  ACE Inhibitors Passed - 04/12/2023 11:58 AM      Passed - Cr in normal range and within 180 days    Creat  Date Value Ref Range Status  01/29/2023 1.07 0.70 - 1.28 mg/dL Final         Passed - K in normal range and within 180 days    Potassium  Date Value Ref Range Status  01/29/2023 4.1 3.5 - 5.3 mmol/L Final         Passed - Patient is not pregnant      Passed - Last BP in normal range    BP Readings from Last 1 Encounters:  01/29/23 136/82         Passed - Valid encounter within last 6 months    Recent Outpatient Visits           2 months ago Irregular cardiac rhythm   [place] [facility] Donita Brooks, MD   9 months ago Essential hypertension   [facility] Pickard, Priscille Heidelberg, MD

## 2023-04-23 ENCOUNTER — Other Ambulatory Visit (HOSPITAL_COMMUNITY): Payer: Self-pay | Admitting: Physician Assistant

## 2023-04-23 DIAGNOSIS — Z96659 Presence of unspecified artificial knee joint: Secondary | ICD-10-CM

## 2023-04-23 DIAGNOSIS — T84028D Dislocation of other internal joint prosthesis, subsequent encounter: Secondary | ICD-10-CM

## 2023-04-27 ENCOUNTER — Encounter (HOSPITAL_COMMUNITY)
Admission: RE | Admit: 2023-04-27 | Discharge: 2023-04-27 | Disposition: A | Discharge status: Home / Self Care | Source: Ambulatory Visit | Attending: Physician Assistant | Admitting: Physician Assistant

## 2023-04-27 DIAGNOSIS — Z96651 Presence of right artificial knee joint: Secondary | ICD-10-CM | POA: Diagnosis not present

## 2023-04-27 DIAGNOSIS — T84028D Dislocation of other internal joint prosthesis, subsequent encounter: Secondary | ICD-10-CM | POA: Diagnosis not present

## 2023-04-27 DIAGNOSIS — Z96659 Presence of unspecified artificial knee joint: Secondary | ICD-10-CM | POA: Insufficient documentation

## 2023-04-27 MED ORDER — TECHNETIUM TC 99M MEDRONATE IV KIT
20.0000 | PACK | Freq: Once | INTRAVENOUS | Status: AC | PRN
  Administered 2023-04-27: 19 via INTRAVENOUS

## 2023-05-13 DIAGNOSIS — Z4789 Encounter for other orthopedic aftercare: Secondary | ICD-10-CM | POA: Diagnosis not present

## 2023-05-14 DIAGNOSIS — M25512 Pain in left shoulder: Secondary | ICD-10-CM | POA: Diagnosis not present

## 2023-05-14 DIAGNOSIS — M25612 Stiffness of left shoulder, not elsewhere classified: Secondary | ICD-10-CM | POA: Diagnosis not present

## 2023-05-18 ENCOUNTER — Other Ambulatory Visit: Payer: Self-pay | Admitting: Family Medicine

## 2023-05-18 DIAGNOSIS — E7800 Pure hypercholesterolemia, unspecified: Secondary | ICD-10-CM

## 2023-05-18 DIAGNOSIS — M25512 Pain in left shoulder: Secondary | ICD-10-CM | POA: Diagnosis not present

## 2023-05-18 DIAGNOSIS — M25612 Stiffness of left shoulder, not elsewhere classified: Secondary | ICD-10-CM | POA: Diagnosis not present

## 2023-05-20 NOTE — Telephone Encounter (Signed)
 Patient must have an office visit for additional refills. Requested Prescriptions  Pending Prescriptions Disp Refills   rosuvastatin  (CRESTOR ) 20 MG tablet [Pharmacy Med Name: Rosuvastatin  Calcium  20 MG Oral Tablet] 90 tablet 0    Sig: TAKE 1 TABLET BY MOUTH ONCE DAILY . APPOINTMENT REQUIRED FOR FUTURE REFILLS     Cardiovascular:  Antilipid - Statins 2 Failed - 05/20/2023  8:01 AM      Failed - Lipid Panel in normal range within the last 12 months    Cholesterol  Date Value Ref Range Status  06/18/2022 106 <200 mg/dL Final   LDL Cholesterol (Calc)  Date Value Ref Range Status  06/18/2022 51 mg/dL (calc) Final    Comment:    Reference range: <100 . Desirable range <100 mg/dL for primary prevention;   <70 mg/dL for patients with CHD or diabetic patients  with > or = 2 CHD risk factors. Aaron Aas LDL-C is now calculated using the Martin-Hopkins  calculation, which is a validated novel method providing  better accuracy than the Friedewald equation in the  estimation of LDL-C.  Melinda Sprawls et al. Erroll Heard. 1829;937(16): 2061-2068  (http://education.[facility].com/faq/FAQ164)    HDL  Date Value Ref Range Status  06/18/2022 38 (L) > OR = 40 mg/dL Final   Triglycerides  Date Value Ref Range Status  06/18/2022 90 <150 mg/dL Final         Passed - Cr in normal range and within 360 days    Creat  Date Value Ref Range Status  01/29/2023 1.07 0.70 - 1.28 mg/dL Final         Passed - Patient is not pregnant      Passed - Valid encounter within last 12 months    Recent Outpatient Visits           3 months ago Irregular cardiac rhythm   [place] [facility] Pickard, Cisco Crest, MD   11 months ago Essential hypertension   [place] [facility] Pickard, Cisco Crest, MD

## 2023-07-08 DIAGNOSIS — S92415A Nondisplaced fracture of proximal phalanx of left great toe, initial encounter for closed fracture: Secondary | ICD-10-CM | POA: Diagnosis not present

## 2023-07-08 DIAGNOSIS — M79672 Pain in left foot: Secondary | ICD-10-CM | POA: Diagnosis not present

## 2023-07-08 DIAGNOSIS — M2012 Hallux valgus (acquired), left foot: Secondary | ICD-10-CM | POA: Diagnosis not present

## 2023-07-09 ENCOUNTER — Other Ambulatory Visit: Payer: Self-pay | Admitting: Family Medicine

## 2023-07-12 DIAGNOSIS — L821 Other seborrheic keratosis: Secondary | ICD-10-CM | POA: Diagnosis not present

## 2023-07-12 DIAGNOSIS — Z85828 Personal history of other malignant neoplasm of skin: Secondary | ICD-10-CM | POA: Diagnosis not present

## 2023-07-12 DIAGNOSIS — L570 Actinic keratosis: Secondary | ICD-10-CM | POA: Diagnosis not present

## 2023-07-12 DIAGNOSIS — D485 Neoplasm of uncertain behavior of skin: Secondary | ICD-10-CM | POA: Diagnosis not present

## 2023-07-12 DIAGNOSIS — L814 Other melanin hyperpigmentation: Secondary | ICD-10-CM | POA: Diagnosis not present

## 2023-07-12 DIAGNOSIS — L812 Freckles: Secondary | ICD-10-CM | POA: Diagnosis not present

## 2023-07-12 DIAGNOSIS — L905 Scar conditions and fibrosis of skin: Secondary | ICD-10-CM | POA: Diagnosis not present

## 2023-07-22 ENCOUNTER — Ambulatory Visit: Admitting: Family Medicine

## 2023-07-22 ENCOUNTER — Encounter: Payer: Self-pay | Admitting: Family Medicine

## 2023-07-22 VITALS — BP 120/72 | HR 75 | Temp 98.1°F | Ht 70.0 in | Wt 223.0 lb

## 2023-07-22 DIAGNOSIS — A09 Infectious gastroenteritis and colitis, unspecified: Secondary | ICD-10-CM

## 2023-07-22 MED ORDER — CIPROFLOXACIN HCL 500 MG PO TABS: 500.0000 mg | ORAL_TABLET | Freq: Two times a day (BID) | ORAL | 0 refills | Status: AC

## 2023-07-22 NOTE — Progress Notes (Signed)
 Subjective:    Patient ID: Shane Robinson, male    DOB: 04/29/1950, 73 y.o.   MRN: [id number]  Patient is a very pleasant 73 year old Caucasian gentleman.  He works as a Engineer, maintenance.  He is constantly around cows, cow manure, pastures, etc. he has had watery diarrhea for 4 weeks straight.  Its 3-4 times a day.  He states that as soon as he eats, he will have profuse watery diarrhea with cramping abdominal pain.  He states that he cannot trust the fart.  He reports occasional accidents.  He also has crampy abdominal pain but denies fever or blood in his stool.  He denies any travel. Past Medical History:  Diagnosis Date   Allergy    Elevated lipids    History of kidney stones    X 2 PASSED ON OWN   Hyperlipidemia    Hypertension    Lumbar pars defect    Pulmonary nodules    Thoracic aortic aneurysm without rupture (HCC)    STABLE 4.1 CM STABLE PER DR BATRLE 05-26-17 EPIC NOTE   Past Surgical History:  Procedure Laterality Date   HERNIA REPAIR  1975   INGUINAL   MENISCUS REPAIR RIGHT KNEE  2016   SHOULDER SURGERY Right 2002   ROTATOR CUFF    TOTAL KNEE ARTHROPLASTY Right 07/21/2017   Procedure: RIGHT TOTAL KNEE ARTHROPLASTY, PERTIAL PATELLECTOMY, REPAIR QUADRICEPS TENDON;  Surgeon: Duwayne Purchase, MD;  Location: [facility];  Service: Orthopedics;  Laterality: Right;  120 mins   Current Outpatient Medications on File Prior to Visit  Medication Sig Dispense Refill   benazepril  (LOTENSIN ) 20 MG tablet Take 1 tablet by mouth once daily 90 tablet 0   rosuvastatin  (CRESTOR ) 20 MG tablet TAKE 1 TABLET BY MOUTH ONCE DAILY . APPOINTMENT REQUIRED FOR FUTURE REFILLS 90 tablet 0   No current facility-administered medications on file prior to visit.   Allergies  Allergen Reactions   Latex Hives        Review of Systems  All other systems reviewed and are negative.      Objective:   Physical Exam Vitals reviewed.  Constitutional:      Appearance: Normal appearance. He is normal  weight.  Cardiovascular:     Rate and Rhythm: Normal rate and regular rhythm.     Heart sounds: Normal heart sounds. No murmur heard.    No friction rub. No gallop.  Pulmonary:     Effort: Pulmonary effort is normal. No respiratory distress.     Breath sounds: Normal breath sounds. No wheezing, rhonchi or rales.  Chest:     Chest wall: No tenderness.  Abdominal:     General: Bowel sounds are normal. There is no distension.     Palpations: Abdomen is soft.     Tenderness: There is no abdominal tenderness. There is no rebound.  Musculoskeletal:     Right lower leg: No edema.     Left lower leg: No edema.  Neurological:     General: No focal deficit present.     Mental Status: He is alert and oriented to person, place, and time.     Motor: No abnormal muscle tone.     Deep Tendon Reflexes: Reflexes are normal and symmetric.          Assessment & Plan:  Diarrhea of infectious origin - Plan: Salmonella/Shigella Cult, Campy EIA and Shiga Toxin reflex, C. difficile GDH and Toxin A/B, Ova and parasite examination I suspect Salmonella or Shigella due  to occupational exposure.  Patient has a history of a joint replacement with hardware.  I am concerned about invasive disease.  Therefore recommended Cipro  500 mg twice daily for 7 days.  Meanwhile check stool cultures, stool ova and parasite, and C. difficile toxin studies to rule out other potential causes of infectious diarrhea

## 2023-07-23 DIAGNOSIS — A09 Infectious gastroenteritis and colitis, unspecified: Secondary | ICD-10-CM | POA: Diagnosis not present

## 2023-07-26 ENCOUNTER — Ambulatory Visit: Payer: Self-pay

## 2023-07-26 LAB — C. DIFFICILE GDH AND TOXIN A/B
GDH ANTIGEN: NOT DETECTED
MICRO NUMBER:: 16717689
SPECIMEN QUALITY:: ADEQUATE
TOXIN A AND B: NOT DETECTED

## 2023-07-26 NOTE — Telephone Encounter (Signed)
 FYI Only or Action Required?: FYI only for provider.  Patient was last seen in primary care on 07/22/2023 by Duanne Butler DASEN, MD.  Called Nurse Triage reporting Diarrhea.  Symptoms began several weeks ago.  Interventions attempted: Prescription medications: currently on Cipro .  Symptoms are: gradually improving.  Triage Disposition: See PCP Within 2 Weeks  Patient/caregiver understands and will follow disposition?: Yes     Copied from CRM [id number]. Topic: Clinical - Red Word Triage >> Jul 26, 2023  4:56 PM Delon HERO wrote: Red Word that prompted transfer to Nurse Triage: Patient is calling to report that on 07/22/23 seen in the office for Diarrhea of infectious origin reporting gas, and no abdominal pain. Prescribed ciprofloxacin  (CIPRO ) 500 MG tablet [[id number]] no improvement with diahrea. Awaiting lab results. Last Sunday 5 weeks with diarrhea. Reason for Disposition  Diarrhea is a chronic symptom (recurrent or ongoing AND present > 4 weeks)  Answer Assessment - Initial Assessment Questions 1. DIARRHEA SEVERITY: How bad is the diarrhea? How many more stools have you had in the past 24 hours than normal?      X 2 2. ONSET: When did the diarrhea begin?      X5 week 3. STOOL DESCRIPTION:  How loose or watery is the diarrhea? What is the stool color? Is there any blood or mucous in the stool?     Brown water with gas behind it 4. VOMITING: Are you also vomiting? If Yes, ask: How many times in the past 24 hours?      denies 5. ABDOMEN PAIN: Are you having any abdomen pain? If Yes, ask: What does it feel like? (e.g., crampy, dull, intermittent, constant)      My stomach don't feel right taste is off 6. ABDOMEN PAIN SEVERITY: If present, ask: How bad is the pain?  (e.g., Scale 1-10; mild, moderate, or severe)     N/a 7. ORAL INTAKE: If vomiting, Have you been able to drink liquids? How much liquids have you had in the past 24 hours?     Endorses  drinking water and gatorade 8. HYDRATION: Any signs of dehydration? (e.g., dry mouth [not just dry lips], too weak to stand, dizziness, new weight loss) When did you last urinate?     Denies Last UOP 15 mins ago 9. EXPOSURE: Have you traveled to a foreign country recently? Have you been exposed to anyone with diarrhea? Could you have eaten any food that was spoiled?     denies 10. ANTIBIOTIC USE: Are you taking antibiotics now or have you taken antibiotics in the past 2 months?       ciprofloxacin  (CIPRO ) 500 MG tablet - started Friday 11. OTHER SYMPTOMS: Do you have any other symptoms? (e.g., fever, blood in stool)       Endorses very gassy 12. PREGNANCY: Is there any chance you are pregnant? When was your last menstrual period?       N/a  Protocols used: [facility]

## 2023-07-27 ENCOUNTER — Ambulatory Visit: Payer: Self-pay | Admitting: Family Medicine

## 2023-07-27 ENCOUNTER — Encounter: Payer: Self-pay | Admitting: Family Medicine

## 2023-07-27 ENCOUNTER — Ambulatory Visit: Admitting: Family Medicine

## 2023-07-27 VITALS — BP 118/78 | HR 71 | Temp 98.6°F | Ht 70.0 in | Wt 221.4 lb

## 2023-07-27 DIAGNOSIS — A09 Infectious gastroenteritis and colitis, unspecified: Secondary | ICD-10-CM

## 2023-07-27 DIAGNOSIS — M6250 Muscle wasting and atrophy, not elsewhere classified, unspecified site: Secondary | ICD-10-CM

## 2023-07-27 LAB — COMPREHENSIVE METABOLIC PANEL WITH GFR
AG Ratio: 1.5 (calc) (ref 1.0–2.5)
ALT: 21 U/L (ref 9–46)
AST: 24 U/L (ref 10–35)
Albumin: 4.3 g/dL (ref 3.6–5.1)
Alkaline phosphatase (APISO): 61 U/L (ref 35–144)
BUN: 24 mg/dL (ref 7–25)
CO2: 25 mmol/L (ref 20–32)
Calcium: 9.1 mg/dL (ref 8.6–10.3)
Chloride: 107 mmol/L (ref 98–110)
Creat: 1.1 mg/dL (ref 0.70–1.28)
Globulin: 2.8 g/dL (ref 1.9–3.7)
Glucose, Bld: 126 mg/dL — ABNORMAL HIGH (ref 65–99)
Potassium: 4.1 mmol/L (ref 3.5–5.3)
Sodium: 140 mmol/L (ref 135–146)
Total Bilirubin: 0.4 mg/dL (ref 0.2–1.2)
Total Protein: 7.1 g/dL (ref 6.1–8.1)
eGFR: 71 mL/min/1.73m2 (ref >=60)

## 2023-07-27 LAB — CBC WITH DIFFERENTIAL/PLATELET
Absolute Lymphocytes: 2502 {cells}/uL (ref 850–3900)
Absolute Monocytes: 522 {cells}/uL (ref 200–950)
Basophils Absolute: 54 {cells}/uL (ref 0–200)
Basophils Relative: 0.6 %
Eosinophils Absolute: 126 {cells}/uL (ref 15–500)
Eosinophils Relative: 1.4 %
HCT: 43.7 % (ref 38.5–50.0)
Hemoglobin: 14 g/dL (ref 13.2–17.1)
MCH: 30.2 pg (ref 27.0–33.0)
MCHC: 32 g/dL (ref 32.0–36.0)
MCV: 94.4 fL (ref 80.0–100.0)
MPV: 12.7 fL — ABNORMAL HIGH (ref 7.5–12.5)
Monocytes Relative: 5.8 %
Neutro Abs: 5796 {cells}/uL (ref 1500–7800)
Neutrophils Relative %: 64.4 %
Platelets: 174 Thousand/uL (ref 140–400)
RBC: 4.63 Million/uL (ref 4.20–5.80)
RDW: 12.4 % (ref 11.0–15.0)
Total Lymphocyte: 27.8 %
WBC: 9 Thousand/uL (ref 3.8–10.8)

## 2023-07-27 LAB — CK: Total CK: 413 U/L — ABNORMAL HIGH (ref 19–278)

## 2023-07-27 LAB — SALMONELLA/SHIGELLA CULT, CAMPY EIA AND SHIGA TOXIN RFL ECOLI
MICRO NUMBER: 16717522
MICRO NUMBER:: 16717523
MICRO NUMBER:: 16717524
Result:: NOT DETECTED
SHIGA RESULT:: NOT DETECTED
SPECIMEN QUALITY: ADEQUATE
SPECIMEN QUALITY:: ADEQUATE
SPECIMEN QUALITY:: ADEQUATE

## 2023-07-27 LAB — TESTOSTERONE TOTAL,FREE,BIO, MALES
Albumin: 4.3 g/dL (ref 3.6–5.1)
Sex Hormone Binding: 36 nmol/L (ref 22–77)
Testosterone: 230 ng/dL — ABNORMAL LOW (ref 250–827)

## 2023-07-27 LAB — TSH: TSH: 2.16 m[IU]/L (ref 0.40–4.50)

## 2023-07-27 NOTE — Progress Notes (Signed)
 Subjective:    Patient ID: Shane Robinson, male    DOB: [birth year], 73 y.o.   MRN: [id number] 07/22/23 Patient is a very pleasant 73 year old Caucasian gentleman.  He works as a Engineer, maintenance.  He is constantly around cows, cow manure, pastures, etc. he has had watery diarrhea for 4 weeks straight.  Its 3-4 times a day.  He states that as soon as he eats, he will have profuse watery diarrhea with cramping abdominal pain.  He states that he cannot trust the fart.  He reports occasional accidents.  He also has crampy abdominal pain but denies fever or blood in his stool.  He denies any travel.  At that time, my plan was: I suspect Salmonella or Shigella due to occupational exposure.  Patient has a history of a joint replacement with hardware.  I am concerned about invasive disease.  Therefore recommended Cipro  500 mg twice daily for 7 days.  Meanwhile check stool cultures, stool ova and parasite, and C. difficile toxin studies to rule out other potential causes of infectious diarrhea  07/27/23 Stool cultures were negative for Salmonella, Shigella, Campylobacter.  C. difficile toxins were negative.  Patient is slightly better after taking Cipro .  He continues to have 2-3 watery bowel movements today with excessive gas.  He states that over the last 24 hours he is no longer feeling nauseated.  He denies fever or chills.  Denies any melena or hematochezia.  He does state that over the last few months he has had significant wasting of his muscles.  He states that he is lost tremendous strength in his arms and shoulders and chest muscles.  He feels like his muscles are getting weaker.  He denies any muscle pain. Past Medical History:  Diagnosis Date   Allergy    Elevated lipids    History of kidney stones    X 2 PASSED ON OWN   Hyperlipidemia    Hypertension    Lumbar pars defect    Pulmonary nodules    Thoracic aortic aneurysm without rupture (HCC)    STABLE 4.1 CM STABLE PER DR BATRLE 05-26-17 EPIC  NOTE   Past Surgical History:  Procedure Laterality Date   HERNIA REPAIR  1975   INGUINAL   MENISCUS REPAIR RIGHT KNEE  2016   SHOULDER SURGERY Right 2002   ROTATOR CUFF    TOTAL KNEE ARTHROPLASTY Right 07/21/2017   Procedure: RIGHT TOTAL KNEE ARTHROPLASTY, PERTIAL PATELLECTOMY, REPAIR QUADRICEPS TENDON;  Surgeon: Duwayne Purchase, MD;  Location: [facility];  Service: Orthopedics;  Laterality: Right;  120 mins   Current Outpatient Medications on File Prior to Visit  Medication Sig Dispense Refill   benazepril  (LOTENSIN ) 20 MG tablet Take 1 tablet by mouth once daily 90 tablet 0   ciprofloxacin  (CIPRO ) 500 MG tablet Take 1 tablet (500 mg total) by mouth 2 (two) times daily for 7 days. 14 tablet 0   rosuvastatin  (CRESTOR ) 20 MG tablet TAKE 1 TABLET BY MOUTH ONCE DAILY . APPOINTMENT REQUIRED FOR FUTURE REFILLS 90 tablet 0   No current facility-administered medications on file prior to visit.   Allergies  Allergen Reactions   Latex Hives        Review of Systems  All other systems reviewed and are negative.      Objective:   Physical Exam Vitals reviewed.  Constitutional:      Appearance: Normal appearance. He is normal weight.  Cardiovascular:     Rate and Rhythm: Normal rate and regular  rhythm.     Heart sounds: Normal heart sounds. No murmur heard.    No friction rub. No gallop.  Pulmonary:     Effort: Pulmonary effort is normal. No respiratory distress.     Breath sounds: Normal breath sounds. No wheezing, rhonchi or rales.  Chest:     Chest wall: No tenderness.  Abdominal:     General: Bowel sounds are normal. There is no distension.     Palpations: Abdomen is soft.     Tenderness: There is no abdominal tenderness. There is no rebound.  Musculoskeletal:     Right lower leg: No edema.     Left lower leg: No edema.  Neurological:     General: No focal deficit present.     Mental Status: He is alert and oriented to person, place, and time.     Motor: No abnormal  muscle tone.     Deep Tendon Reflexes: Reflexes are normal and symmetric.          Assessment & Plan:  Diarrhea of infectious origin - Plan: DG Abd 2 Views  Muscular atrophy, unspecified site - Plan: CBC with Differential/Platelet, Comprehensive metabolic panel with GFR, Testosterone  Total,Free,Bio, Males, TSH, CK Given the muscle weakness and subjective atrophy, I will check a CBC, CMP, testosterone  level, TSH, and a CK level.  I believe the patient most likely had infectious diarrhea due to E. coli.  I believe that is improving on Cipro  and have recommended he add a probiotic along with Metamucil or a fiber supplement.  If he develops abdominal pain, I recommended getting an x-ray of the abdomen to rule out severe constipation as a potential cause of bloating.

## 2023-07-28 ENCOUNTER — Ambulatory Visit: Payer: Self-pay | Admitting: Family Medicine

## 2023-07-28 LAB — OVA AND PARASITE EXAMINATION
CONCENTRATE RESULT:: NONE SEEN
MICRO NUMBER:: 16717525
SPECIMEN QUALITY:: ADEQUATE
TRICHROME RESULT:: NONE SEEN

## 2023-07-30 ENCOUNTER — Other Ambulatory Visit

## 2023-07-30 DIAGNOSIS — M6250 Muscle wasting and atrophy, not elsewhere classified, unspecified site: Secondary | ICD-10-CM | POA: Diagnosis not present

## 2023-07-30 DIAGNOSIS — R7989 Other specified abnormal findings of blood chemistry: Secondary | ICD-10-CM | POA: Diagnosis not present

## 2023-07-30 DIAGNOSIS — Z125 Encounter for screening for malignant neoplasm of prostate: Secondary | ICD-10-CM | POA: Diagnosis not present

## 2023-07-30 LAB — FSH/LH
FSH: 9.7 m[IU]/mL (ref 1.4–12.8)
LH: 6.7 m[IU]/mL (ref 1.6–15.2)

## 2023-07-30 LAB — PSA: PSA: 0.42 ng/mL (ref <=4.00)

## 2023-07-30 LAB — TESTOSTERONE: Testosterone: 324 ng/dL (ref 250–827)

## 2023-07-30 LAB — PROLACTIN: Prolactin: 8 ng/mL (ref 2.0–18.0)

## 2023-08-02 ENCOUNTER — Other Ambulatory Visit

## 2023-08-03 ENCOUNTER — Ambulatory Visit: Payer: Self-pay | Admitting: Family Medicine

## 2023-08-06 ENCOUNTER — Other Ambulatory Visit: Payer: Self-pay | Admitting: Family Medicine

## 2023-08-06 ENCOUNTER — Telehealth: Payer: Self-pay

## 2023-08-06 ENCOUNTER — Ambulatory Visit: Payer: Self-pay

## 2023-08-06 NOTE — Telephone Encounter (Signed)
 Copied from CRM [id number]. Topic: Clinical - Prescription Issue >> Aug 06, 2023 12:05 PM Deleta RAMAN wrote: Reason for CRM: patient would like to be placed back on the antibiotics he was placed on for 7 days ciprofloxacin  (CIPRO ) 500 MG tablet. Please follow up with the patient regarding concerns   Please advise.

## 2023-08-06 NOTE — Telephone Encounter (Signed)
 FYI Only or Action Required?: Action required by provider: medication refill request.  Patient was last seen in primary care on 07/27/2023 by Duanne Butler DASEN, MD.  Called Nurse Triage reporting Diarrhea.  Symptoms began to worsen 3-4 days ago  Symptoms are: gradually worsening.  Triage Disposition: Home Care  Patient/caregiver understands and will follow disposition?: Yes        Copied from CRM [id number]. Topic: Clinical - Prescription Issue >> Aug 06, 2023 12:05 PM Deleta RAMAN wrote: Reason for CRM: patient would like to be placed back on the antibiotics he was placed on for 7 days ciprofloxacin  (CIPRO ) 500 MG tablet. Please follow up with the patient regarding concerns       Reason for Disposition  MILD-MODERATE diarrhea (e.g., 1-6 times / day more than normal)  Answer Assessment - Initial Assessment Questions 1. DIARRHEA SEVERITY: How bad is the diarrhea? How many more stools have you had in the past 24 hours than normal?      Has only had 4 bowel movements in the last 24 hours 2. ONSET: When did the diarrhea begin?      2-3 days has been getting worse  3. STOOL DESCRIPTION:  How loose or watery is the diarrhea? What is the stool color? Is there any blood or mucous in the stool?     Loose 4. VOMITING: Are you also vomiting? If Yes, ask: How many times in the past 24 hours?      No 5. ABDOMEN PAIN: Are you having any abdomen pain? If Yes, ask: What does it feel like? (e.g., crampy, dull, intermittent, constant)      No 6. ABDOMEN PAIN SEVERITY: If present, ask: How bad is the pain?  (e.g., Scale 1-10; mild, moderate, or severe)     0/10 7. ORAL INTAKE: If vomiting, Have you been able to drink liquids? How much liquids have you had in the past 24 hours?     N/A 8. HYDRATION: Any signs of dehydration? (e.g., dry mouth [not just dry lips], too weak to stand, dizziness, new weight loss) When did you last urinate?     No 9. EXPOSURE: Have you  traveled to a foreign country recently? Have you been exposed to anyone with diarrhea? Could you have eaten any food that was spoiled?     No 10. ANTIBIOTIC USE: Are you taking antibiotics now or have you taken antibiotics in the past 2 months?       Has been on Cipro  for the same recently  11. OTHER SYMPTOMS: Do you have any other symptoms? (e.g., fever, blood in stool)       Gas  Protocols used: Diarrhea-A-AH

## 2023-08-06 NOTE — Telephone Encounter (Signed)
 Left voicemail to call office back so I could ask him what symptoms he is having.

## 2023-08-09 ENCOUNTER — Other Ambulatory Visit: Payer: Self-pay | Admitting: Family Medicine

## 2023-08-09 MED ORDER — CIPROFLOXACIN HCL 500 MG PO TABS: 500.0000 mg | ORAL_TABLET | Freq: Two times a day (BID) | ORAL | 0 refills | Status: AC

## 2023-08-09 MED ORDER — METRONIDAZOLE 500 MG PO TABS: 500.0000 mg | ORAL_TABLET | Freq: Three times a day (TID) | ORAL | 0 refills | Status: AC

## 2023-08-17 DIAGNOSIS — M170 Bilateral primary osteoarthritis of knee: Secondary | ICD-10-CM | POA: Diagnosis not present

## 2023-08-17 DIAGNOSIS — M94262 Chondromalacia, left knee: Secondary | ICD-10-CM | POA: Diagnosis not present

## 2023-08-30 ENCOUNTER — Other Ambulatory Visit: Payer: Self-pay | Admitting: Family Medicine

## 2023-08-30 DIAGNOSIS — E7800 Pure hypercholesterolemia, unspecified: Secondary | ICD-10-CM

## 2023-08-31 ENCOUNTER — Other Ambulatory Visit: Payer: Self-pay | Admitting: Family Medicine

## 2023-08-31 DIAGNOSIS — E7800 Pure hypercholesterolemia, unspecified: Secondary | ICD-10-CM

## 2023-08-31 NOTE — Telephone Encounter (Unsigned)
 Copied from CRM [id number]. Topic: Clinical - Medication Refill >> Aug 31, 2023  3:18 PM Zebedee SAUNDERS wrote: Medication: rosuvastatin  (CRESTOR ) 20 MG tablet  Has the patient contacted their pharmacy? Yes (Agent: If no, request that the patient contact the pharmacy for the refill. If patient does not wish to contact the pharmacy document the reason why and proceed with request.) (Agent: If yes, when and what did the pharmacy advise?)  This is the patient's preferred pharmacy:  [facility] [address], [place] - [address] [place] [address] Phone: [phone] Fax: [phone]  Is this the correct pharmacy for this prescription? Yes If no, delete pharmacy and type the correct one.   Has the prescription been filled recently? Yes  Is the patient out of the medication? Yes  Has the patient been seen for an appointment in the last year OR does the patient have an upcoming appointment? Yes  Can we respond through MyChart? Yes  Agent: Please be advised that Rx refills may take up to 3 business days. We ask that you follow-up with your pharmacy.

## 2023-08-31 NOTE — Telephone Encounter (Signed)
 Requested medications are due for refill today.  yes  Requested medications are on the active medications list.  yes  Last refill. 05/20/2023 #90 0 rf  Future visit scheduled.   no  Notes to clinic.  Labs are expired.    Requested Prescriptions  Pending Prescriptions Disp Refills   rosuvastatin  (CRESTOR ) 20 MG tablet [Pharmacy Med Name: Rosuvastatin  Calcium  20 MG Oral Tablet] 90 tablet 0    Sig: TAKE 1 TABLET BY MOUTH ONCE DAILY . APPOINTMENT REQUIRED FOR FUTURE REFILLS     Cardiovascular:  Antilipid - Statins 2 Failed - 08/31/2023  3:00 PM      Failed - Lipid Panel in normal range within the last 12 months    Cholesterol  Date Value Ref Range Status  06/18/2022 106 <200 mg/dL Final   LDL Cholesterol (Calc)  Date Value Ref Range Status  06/18/2022 51 mg/dL (calc) Final    Comment:    Reference range: <100 . Desirable range <100 mg/dL for primary prevention;   <70 mg/dL for patients with CHD or diabetic patients  with > or = 2 CHD risk factors. SABRA LDL-C is now calculated using the Martin-Hopkins  calculation, which is a validated novel method providing  better accuracy than the Friedewald equation in the  estimation of LDL-C.  Gladis APPLETHWAITE et al. SANDREA. 7986;689(80): 2061-2068  (http://education.[facility].com/faq/FAQ164)    HDL  Date Value Ref Range Status  06/18/2022 38 (L) > OR = 40 mg/dL Final   Triglycerides  Date Value Ref Range Status  06/18/2022 90 <150 mg/dL Final         Passed - Cr in normal range and within 360 days    Creat  Date Value Ref Range Status  07/27/2023 1.10 0.70 - 1.28 mg/dL Final         Passed - Patient is not pregnant      Passed - Valid encounter within last 12 months    Recent Outpatient Visits           1 month ago Diarrhea of infectious origin   [place] [facility] Pickard, Butler DASEN, MD   1 month ago Diarrhea of infectious origin   [place] [facility] Duanne Butler DASEN, MD   7  months ago Irregular cardiac rhythm   [place] [facility] Pickard, Butler DASEN, MD   1 year ago Essential hypertension   [place] [facility] Pickard, Butler DASEN, MD

## 2023-09-07 DIAGNOSIS — M25562 Pain in left knee: Secondary | ICD-10-CM | POA: Diagnosis not present

## 2023-09-14 DIAGNOSIS — M94262 Chondromalacia, left knee: Secondary | ICD-10-CM | POA: Diagnosis not present

## 2023-09-14 DIAGNOSIS — M170 Bilateral primary osteoarthritis of knee: Secondary | ICD-10-CM | POA: Diagnosis not present

## 2023-09-16 NOTE — Patient Instructions (Signed)
 Erroneous encounter

## 2023-09-21 ENCOUNTER — Telehealth: Payer: Self-pay

## 2023-09-21 NOTE — Telephone Encounter (Signed)
 Copied from CRM [id number]. Topic: Clinical - Medical Advice >> Sep 21, 2023  3:48 PM Debby BROCKS wrote: Reason for CRM: Patient wants to know if the information he left for his PCP was delivered to him or not. It was a message to call a cardiologist (he does not remember the phone # at the moment but he provided it to the front desk). He needs Dr. Duanne to speak to the cardiologist so that he may continue with his knee replacement.  He would like for someone to call him back to speak on the matter

## 2023-10-12 ENCOUNTER — Other Ambulatory Visit: Payer: Self-pay | Admitting: Family Medicine

## 2023-10-12 DIAGNOSIS — H5203 Hypermetropia, bilateral: Secondary | ICD-10-CM | POA: Diagnosis not present

## 2023-11-11 ENCOUNTER — Ambulatory Visit: Payer: Self-pay | Admitting: Student

## 2023-11-23 NOTE — Patient Instructions (Signed)
 SURGICAL WAITING ROOM VISITATION Patients having surgery or a procedure may have no more than 2 support people in the waiting area - these visitors may rotate in the visitor waiting room.   Due to an increase in RSV and influenza rates and associated hospitalizations, children ages 66 and under may not visit patients in [facility] hospitals. If the patient needs to stay at the hospital during part of their recovery, the visitor guidelines for inpatient rooms apply.  PRE-OP VISITATION  Pre-op nurse will coordinate an appropriate time for 1 support person to accompany the patient in pre-op.  This support person may not rotate.  This visitor will be contacted when the time is appropriate for the visitor to come back in the pre-op area.  Please refer to the [facility] website for the visitor guidelines for Inpatients (after your surgery is over and you are in a regular room).  You are not required to quarantine at this time prior to your surgery. However, you must do this: Hand Hygiene often Do NOT share personal items Notify your provider if you are in close contact with someone who has COVID or you develop fever 100.4 or greater, new onset of sneezing, cough, sore throat, shortness of breath or body aches.  If you test positive for Covid or have been in contact with anyone that has tested positive in the last 10 days please notify you surgeon.    Your procedure is scheduled on:  12/01/23  Report to [facility] Main Entrance: [place] entrance where the [employer] is available.   Report to admitting at: 7:50 AM  Call this number if you have any questions or problems the morning of surgery [phone]  FOLLOW ANY ADDITIONAL PRE OP INSTRUCTIONS YOU RECEIVED FROM YOUR SURGEON'S OFFICE!!!  Do not eat food after Midnight the night prior to your surgery/procedure.  After Midnight you may have the following liquids until : 7:20 AM DAY OF SURGERY  Clear Liquid Diet Water Black  Coffee (sugar ok, NO MILK/CREAM OR CREAMERS)  Tea (sugar ok, NO MILK/CREAM OR CREAMERS) regular and decaf                             Plain Jell-O  with no fruit (NO RED)                                           Fruit ices (not with fruit pulp, NO RED)                                     Popsicles (NO RED)                                                                  Juice: NO CITRUS JUICES: only apple, WHITE grape, WHITE cranberry Sports drinks like Gatorade or Powerade (NO RED)   The day of surgery:  Drink ONE (1) Pre-Surgery Clear Ensure at : 7:20 AM the morning of surgery. Drink in one sitting. Do not sip.  This drink was  given to you during your hospital pre-op appointment visit. Nothing else to drink after completing the Pre-Surgery Clear Ensure or G2 : No candy, chewing gum or throat lozenges.    Oral Hygiene is also important to reduce your risk of infection.        Remember - BRUSH YOUR TEETH THE MORNING OF SURGERY WITH YOUR REGULAR TOOTHPASTE  Do NOT smoke after Midnight the night before surgery.  STOP TAKING all Vitamins, Herbs and supplements 1 week before your surgery.   Take ONLY these medicines the morning of surgery with A SIP OF WATER: NONE.                   You may not have any metal on your body including hair pins, jewelry, and body piercing  Do not wear lotions, powders, perfumes / cologne, or deodorant   Men may shave face and neck.  Contacts, Hearing Aids, dentures or bridgework may not be worn into surgery. DENTURES WILL BE REMOVED PRIOR TO SURGERY PLEASE DO NOT APPLY Poly grip OR ADHESIVES!!!  You may bring a small overnight bag with you on the day of surgery, only pack items that are not valuable. [facility] IS NOT RESPONSIBLE   FOR VALUABLES THAT ARE LOST OR STOLEN.   Patients discharged on the day of surgery will not be allowed to drive home.  Someone NEEDS to stay with you for the first 24 hours after anesthesia.  Do not bring your home  medications to the hospital. The Pharmacy will dispense medications listed on your medication list to you during your admission in the Hospital.  Special Instructions: Bring a copy of your healthcare power of attorney and living will documents the day of surgery, if you wish to have them scanned into your [facility] Records- EPIC  Please read over the following fact sheets you were given: IF YOU HAVE QUESTIONS ABOUT YOUR PRE-OP INSTRUCTIONS, PLEASE CALL [phone]  PATIENT SIGNATURE_________________________________  NURSE SIGNATURE__________________________________  ________________________________________________________________________  Pre-operative 4 CHG Bath Instructions  DYNA-Hex 4 Chlorhexidine  Gluconate 4% Solution Antiseptic 4 fl. oz   You can play a key role in reducing the risk of infection after surgery. Your skin needs to be as free of germs as possible. You can reduce the number of germs on your skin by washing with CHG (chlorhexidine  gluconate) soap before surgery. CHG is an antiseptic soap that kills germs and continues to kill germs even after washing.   DO NOT use if you have an allergy to chlorhexidine /CHG or antibacterial soaps. If your skin becomes reddened or irritated, stop using the CHG and notify one of our RNs at   Please shower with the CHG soap starting 4 days before surgery using the following schedule:     Please keep in mind the following:  DO NOT shave, including legs and underarms, starting the day of your first shower.   You may shave your face at any point before/day of surgery.  Place clean sheets on your bed the day you start using CHG soap. Use a clean washcloth (not used since being washed) for each shower. DO NOT sleep with pets once you start using the CHG.  CHG Shower Instructions:  If you choose to wash your hair and private area, wash first with your normal shampoo/soap.  After you use shampoo/soap, rinse your hair and body  thoroughly to remove shampoo/soap residue.  Turn the water OFF and apply about 3 tablespoons (45 ml) of CHG soap to a CLEAN  washcloth.  Apply CHG soap ONLY FROM YOUR NECK DOWN TO YOUR TOES (washing for 3-5 minutes)  DO NOT use CHG soap on face, private areas, open wounds, or sores.  Pay special attention to the area where your surgery is being performed.  If you are having back surgery, having someone wash your back for you may be helpful. Wait 2 minutes after CHG soap is applied, then you may rinse off the CHG soap.  Pat dry with a clean towel  Put on clean clothes/pajamas   If you choose to wear lotion, please use ONLY the CHG-compatible lotions on the back of this paper.     Additional instructions for the day of surgery: DO NOT APPLY any lotions, deodorants, cologne, or perfumes.   Put on clean/comfortable clothes.  Brush your teeth.  Ask your nurse before applying any prescription medications to the skin.   CHG Compatible Lotions   Aveeno Moisturizing lotion  Cetaphil Moisturizing Cream  Cetaphil Moisturizing Lotion  Clairol Herbal Essence Moisturizing Lotion, Dry Skin  Clairol Herbal Essence Moisturizing Lotion, Extra Dry Skin  Clairol Herbal Essence Moisturizing Lotion, Normal Skin  Curel Age Defying Therapeutic Moisturizing Lotion with Alpha Hydroxy  Curel Extreme Care Body Lotion  Curel Soothing Hands Moisturizing Hand Lotion  Curel Therapeutic Moisturizing Cream, Fragrance-Free  Curel Therapeutic Moisturizing Lotion, Fragrance-Free  Curel Therapeutic Moisturizing Lotion, Original Formula  Eucerin Daily Replenishing Lotion  Eucerin Dry Skin Therapy Plus Alpha Hydroxy Crme  Eucerin Dry Skin Therapy Plus Alpha Hydroxy Lotion  Eucerin Original Crme  Eucerin Original Lotion  Eucerin Plus Crme Eucerin Plus Lotion  Eucerin TriLipid Replenishing Lotion  Keri Anti-Bacterial Hand Lotion  Keri Deep Conditioning Original Lotion Dry Skin Formula Softly Scented  Keri Deep  Conditioning Original Lotion, Fragrance Free Sensitive Skin Formula  Keri Lotion Fast Absorbing Fragrance Free Sensitive Skin Formula  Keri Lotion Fast Absorbing Softly Scented Dry Skin Formula  Keri Original Lotion  Keri Skin Renewal Lotion Keri Silky Smooth Lotion  Keri Silky Smooth Sensitive Skin Lotion  Nivea Body Creamy Conditioning Oil  Nivea Body Extra Enriched Lotion  Nivea Body Original Lotion  Nivea Body Sheer Moisturizing Lotion Nivea Crme  Nivea Skin Firming Lotion  NutraDerm 30 Skin Lotion  NutraDerm Skin Lotion  NutraDerm Therapeutic Skin Cream  NutraDerm Therapeutic Skin Lotion  ProShield Protective Hand Cream  Provon moisturizing lotion  Incentive Spirometer  An incentive spirometer is a tool that can help keep your lungs clear and active. This tool measures how well you are filling your lungs with each breath. Taking long deep breaths may help reverse or decrease the chance of developing breathing (pulmonary) problems (especially infection) following: A long period of time when you are unable to move or be active. BEFORE THE PROCEDURE  If the spirometer includes an indicator to show your best effort, your nurse or respiratory therapist will set it to a desired goal. If possible, sit up straight or lean slightly forward. Try not to slouch. Hold the incentive spirometer in an upright position. INSTRUCTIONS FOR USE  Sit on the edge of your bed if possible, or sit up as far as you can in bed or on a chair. Hold the incentive spirometer in an upright position. Breathe out normally. Place the mouthpiece in your mouth and seal your lips tightly around it. Breathe in slowly and as deeply as possible, raising the piston or the ball toward the top of the column. Hold your breath for 3-5 seconds or for as long as  possible. Allow the piston or ball to fall to the bottom of the column. Remove the mouthpiece from your mouth and breathe out normally. Rest for a few seconds and  repeat Steps 1 through 7 at least 10 times every 1-2 hours when you are awake. Take your time and take a few normal breaths between deep breaths. The spirometer may include an indicator to show your best effort. Use the indicator as a goal to work toward during each repetition. After each set of 10 deep breaths, practice coughing to be sure your lungs are clear. If you have an incision (the cut made at the time of surgery), support your incision when coughing by placing a pillow or rolled up towels firmly against it. Once you are able to get out of bed, walk around indoors and cough well. You may stop using the incentive spirometer when instructed by your caregiver.  RISKS AND COMPLICATIONS Take your time so you do not get dizzy or light-headed. If you are in pain, you may need to take or ask for pain medication before doing incentive spirometry. It is harder to take a deep breath if you are having pain. AFTER USE Rest and breathe slowly and easily. It can be helpful to keep track of a log of your progress. Your caregiver can provide you with a simple table to help with this. If you are using the spirometer at home, follow these instructions: SEEK MEDICAL CARE IF:  You are having difficultly using the spirometer. You have trouble using the spirometer as often as instructed. Your pain medication is not giving enough relief while using the spirometer. You develop fever of 100.5 F (38.1 C) or higher. SEEK IMMEDIATE MEDICAL CARE IF:  You cough up bloody sputum that had not been present before. You develop fever of 102 F (38.9 C) or greater. You develop worsening pain at or near the incision site. MAKE SURE YOU:  Understand these instructions. Will watch your condition. Will get help right away if you are not doing well or get worse. Document Released: 05/04/2006 Document Revised: 03/16/2011 Document Reviewed: 07/05/2006 [facility] Patient Information 2014 [place],  [place].   ________________________________________________________________________

## 2023-11-24 ENCOUNTER — Encounter (HOSPITAL_COMMUNITY): Payer: Self-pay

## 2023-11-24 ENCOUNTER — Encounter (HOSPITAL_COMMUNITY)
Admission: RE | Admit: 2023-11-24 | Discharge: 2023-11-24 | Disposition: A | Discharge status: Home / Self Care | Source: Ambulatory Visit | Attending: Orthopedic Surgery | Admitting: Orthopedic Surgery

## 2023-11-24 ENCOUNTER — Other Ambulatory Visit: Payer: Self-pay

## 2023-11-24 VITALS — BP 137/90 | HR 65 | Temp 97.9°F | Ht 69.5 in | Wt 223.0 lb

## 2023-11-24 DIAGNOSIS — Z01810 Encounter for preprocedural cardiovascular examination: Secondary | ICD-10-CM | POA: Diagnosis present

## 2023-11-24 DIAGNOSIS — I10 Essential (primary) hypertension: Secondary | ICD-10-CM | POA: Diagnosis not present

## 2023-11-24 DIAGNOSIS — Z01818 Encounter for other preprocedural examination: Secondary | ICD-10-CM | POA: Diagnosis not present

## 2023-11-24 DIAGNOSIS — Z01812 Encounter for preprocedural laboratory examination: Secondary | ICD-10-CM | POA: Diagnosis present

## 2023-11-24 HISTORY — DX: Unspecified osteoarthritis, unspecified site: M19.90

## 2023-11-24 HISTORY — DX: Malignant (primary) neoplasm, unspecified: C80.1

## 2023-11-24 LAB — CBC
HCT: 42.3 % (ref 39.0–52.0)
Hemoglobin: 13.5 g/dL (ref 13.0–17.0)
MCH: 31 pg (ref 26.0–34.0)
MCHC: 31.9 g/dL (ref 30.0–36.0)
MCV: 97 fL (ref 80.0–100.0)
Platelets: 168 K/uL (ref 150–400)
RBC: 4.36 MIL/uL (ref 4.22–5.81)
RDW: 13 % (ref 11.5–15.5)
WBC: 9.4 K/uL (ref 4.0–10.5)
nRBC: 0 % (ref 0.0–0.2)

## 2023-11-24 LAB — BASIC METABOLIC PANEL WITH GFR
Anion gap: 10 (ref 5–15)
BUN: 21 mg/dL (ref 8–23)
CO2: 24 mmol/L (ref 22–32)
Calcium: 9.2 mg/dL (ref 8.9–10.3)
Chloride: 108 mmol/L (ref 98–111)
Creatinine, Ser: 1.08 mg/dL (ref 0.61–1.24)
GFR, Estimated: >60 mL/min (ref >60)
Glucose, Bld: 106 mg/dL — ABNORMAL HIGH (ref 70–99)
Potassium: 3.9 mmol/L (ref 3.5–5.1)
Sodium: 142 mmol/L (ref 135–145)

## 2023-11-24 LAB — SURGICAL PCR SCREEN
MRSA, PCR: NEGATIVE
Staphylococcus aureus: NEGATIVE

## 2023-11-24 NOTE — Progress Notes (Signed)
 For Anesthesia: PCP - Duanne Butler DASEN, MD LOV: 07/27/23 Cardiologist - N/A  Bowel Prep reminder:  Chest x-ray -  EKG - 11/24/23 Stress Test -  ECHO -  Cardiac Cath -  Pacemaker/ICD device last checked: Pacemaker orders received: Device Rep notified:  Spinal Cord Stimulator:N/A  Sleep Study - N/A CPAP -   Fasting Blood Sugar - N/A Checks Blood Sugar _____ times a day Date and result of last Hgb A1c-  Last dose of GLP1 agonist- N/A GLP1 instructions: Hold 7 days prior to schedule (Hold 24 hours-daily)   Last dose of SGLT-2 inhibitors- N/A SGLT-2 instructions: Hold 72 hours prior to surgery  Blood Thinner Instructions:N/A Last Dose: Time last taken:  Aspirin  Instructions:N/A Last Dose: Time last taken:  Activity level: Can go up a flight of stairs and activities of daily living without stopping and without chest pain and/or shortness of breath   Able to exercise without chest pain and/or shortness of breath   Unable to go up a flight of stairs without chest pain and/or    Hx: HTN Anesthesia review:   Patient denies shortness of breath, fever, cough and chest pain at PAT appointment   Patient verbalized understanding of instructions that were reviewed over the telephone.

## 2023-11-30 ENCOUNTER — Ambulatory Visit: Payer: Self-pay | Admitting: Student

## 2023-11-30 NOTE — H&P (Signed)
 TOTAL KNEE ADMISSION H&P  Patient is being admitted for left total knee arthroplasty.  Subjective:  Chief Complaint:left knee pain.  HPI: Shane Robinson, 73 y.o. male, has a history of pain and functional disability in the left knee due to arthritis and subchondral insufficiency fracture medial femoral condyle and has failed non-surgical conservative treatments for greater than 12 weeks to includeNSAID's and/or analgesics, corticosteriod injections, flexibility and strengthening excercises, use of assistive devices, and activity modification.  Onset of symptoms was gradual, starting 10 years ago with rapidlly worsening course since that time. The patient noted no past surgery on the left knee(s).  Patient currently rates pain in the left knee(s) at 10 out of 10 with activity. Patient has night pain, worsening of pain with activity and weight bearing, pain that interferes with activities of daily living, pain with passive range of motion, crepitus, and joint swelling.  Patient has evidence of subchondral cysts, subchondral sclerosis, periarticular osteophytes, and joint space narrowing by imaging studies. There is no active infection.  Patient Active Problem List   Diagnosis Date Noted   Squamous cell carcinoma of skin of right temple 01/28/2022   History of total knee replacement, right 11/10/2017   Degenerative spondylolisthesis 04/14/2017   Varicose veins of lower extremity 04/14/2017   Pain in left knee 04/13/2017   Primary osteoarthritis of right knee 07/31/2014   S/P right knee arthroscopy 04/16/2014   Pulmonary nodules    Lumbar pars defect    HYPERLIPIDEMIA TYPE I / IV 03/14/2009   HYPERLIPIDEMIA-MIXED 03/14/2009   Essential hypertension 03/14/2009   Past Medical History:  Diagnosis Date   Allergy    Arthritis    Cancer (HCC)    skin   Elevated lipids    History of kidney stones    X 2 PASSED ON OWN   Hyperlipidemia    Hypertension    Lumbar pars defect    Pulmonary  nodules    Thoracic aortic aneurysm without rupture    STABLE 4.1 CM STABLE PER DR BATRLE 05-26-17 EPIC NOTE    Past Surgical History:  Procedure Laterality Date   COLONOSCOPY     HERNIA REPAIR  1975   INGUINAL   MENISCUS REPAIR RIGHT KNEE  2016   SHOULDER SURGERY Right 2002   ROTATOR CUFF    TOTAL KNEE ARTHROPLASTY Right 07/21/2017   Procedure: RIGHT TOTAL KNEE ARTHROPLASTY, PERTIAL PATELLECTOMY, REPAIR QUADRICEPS TENDON;  Surgeon: Duwayne Purchase, MD;  Location: [facility];  Service: Orthopedics;  Laterality: Right;  120 mins   TOTAL SHOULDER ARTHROPLASTY Left 03/22/2023    Current Outpatient Medications  Medication Sig Dispense Refill Last Dose/Taking   benazepril  (LOTENSIN ) 20 MG tablet Take 1 tablet by mouth once daily 90 tablet 0    rosuvastatin  (CRESTOR ) 20 MG tablet TAKE 1 TABLET BY MOUTH ONCE DAILY . APPOINTMENT REQUIRED FOR FUTURE REFILLS 90 tablet 0    No current facility-administered medications for this visit.   Allergies  Allergen Reactions   Latex Hives    Social History   Tobacco Use   Smoking status: Never   Smokeless tobacco: Never  Substance Use Topics   Alcohol  use: Not Currently    Comment: occ    Family History  Problem Relation Age of Onset   Cancer Mother        stomach   Heart disease Father    Stroke Father      Review of Systems  Musculoskeletal:  Positive for arthralgias, gait problem and joint swelling.  All  other systems reviewed and are negative.   Objective:  Physical Exam Constitutional:      Appearance: Normal appearance.  HENT:     Head: Normocephalic and atraumatic.     Nose: Nose normal.     Mouth/Throat:     Pharynx: Oropharynx is clear.  Eyes:     Conjunctiva/sclera: Conjunctivae normal.  Cardiovascular:     Rate and Rhythm: Normal rate and regular rhythm.     Pulses: Normal pulses.     Heart sounds: Normal heart sounds.  Pulmonary:     Effort: Pulmonary effort is normal.     Breath sounds: Normal breath sounds.   Abdominal:     General: Abdomen is flat.     Palpations: Abdomen is soft.  Genitourinary:    Comments: Deferred. Musculoskeletal:     Cervical back: Normal range of motion and neck supple.  Skin:    General: Skin is warm and dry.     Capillary Refill: Capillary refill takes less than 2 seconds.  Neurological:     General: No focal deficit present.     Mental Status: He is alert and oriented to person, place, and time.  Psychiatric:        Mood and Affect: Mood normal.        Behavior: Behavior normal.        Thought Content: Thought content normal.        Judgment: Judgment normal.     Vital signs in last 24 hours: @VSRANGES @  Labs:   Estimated body mass index is 32.46 kg/m as calculated from the following:   Height as of 11/24/23: 5' 9.5 (1.765 m).   Weight as of 11/24/23: 101.2 kg.   Imaging Review Plain radiographs demonstrate severe degenerative joint disease of the left knee(s). The overall alignment issignificant varus. The bone quality appears to be adequate for age and reported activity level.      Assessment/Plan:  End stage arthritis,, and subchondral insufficiency fracture medial femoral condyle left knee   The patient history, physical examination, clinical judgment of the provider and imaging studies are consistent with end stage degenerative joint disease of the left knee(s) and total knee arthroplasty is deemed medically necessary. The treatment options including medical management, injection therapy arthroscopy and arthroplasty were discussed at length. The risks and benefits of total knee arthroplasty were presented and reviewed. The risks due to aseptic loosening, infection, stiffness, patella tracking problems, thromboembolic complications and other imponderables were discussed. The patient acknowledged the explanation, agreed to proceed with the plan and consent was signed. Patient is being admitted for inpatient treatment for surgery, pain control,  PT, OT, prophylactic antibiotics, VTE prophylaxis, progressive ambulation and ADL's and discharge planning. The patient is planning to be discharged home with OPPT after an overnight stay  Therapy Plans: outpatient therapy. EO [place] 12/06/23.  Disposition: Home with wife Planned DVT Prophylaxis: aspirin  81mg  BID DME needed: Has rolling walker.  PCP: Cleared.  TXA: IV Allergies:  - Latex or powder- hives.  Anesthesia Concerns: None.  BMI: 33.2 Last HgbA1c: not diabetic.  Other: - Venous insufficiency.  - Oxycodone , zofran .  [facility] - 11/24/23: Hb 13.5, K+ 3.9, Cr. 1.08.    Patient's anticipated LOS is less than 2 midnights, meeting these requirements: - Younger than 33 - Lives within 1 hour of care - Has a competent adult at home to recover with post-op recover - NO history of  - Chronic pain requiring opiods  - Diabetes  -  Coronary Artery Disease  - Heart failure  - Heart attack  - Stroke  - DVT/VTE  - Cardiac arrhythmia  - Respiratory Failure/COPD  - Renal failure  - Anemia  - Advanced Liver disease

## 2023-11-30 NOTE — H&P (View-Only) (Signed)
 TOTAL KNEE ADMISSION H&P  Patient is being admitted for left total knee arthroplasty.  Subjective:  Chief Complaint:left knee pain.  HPI: Shane Robinson, 73 y.o. male, has a history of pain and functional disability in the left knee due to arthritis and subchondral insufficiency fracture medial femoral condyle and has failed non-surgical conservative treatments for greater than 12 weeks to includeNSAID's and/or analgesics, corticosteriod injections, flexibility and strengthening excercises, use of assistive devices, and activity modification.  Onset of symptoms was gradual, starting 10 years ago with rapidlly worsening course since that time. The patient noted no past surgery on the left knee(s).  Patient currently rates pain in the left knee(s) at 10 out of 10 with activity. Patient has night pain, worsening of pain with activity and weight bearing, pain that interferes with activities of daily living, pain with passive range of motion, crepitus, and joint swelling.  Patient has evidence of subchondral cysts, subchondral sclerosis, periarticular osteophytes, and joint space narrowing by imaging studies. There is no active infection.  Patient Active Problem List   Diagnosis Date Noted   Squamous cell carcinoma of skin of right temple 01/28/2022   History of total knee replacement, right 11/10/2017   Degenerative spondylolisthesis 04/14/2017   Varicose veins of lower extremity 04/14/2017   Pain in left knee 04/13/2017   Primary osteoarthritis of right knee 07/31/2014   S/P right knee arthroscopy 04/16/2014   Pulmonary nodules    Lumbar pars defect    HYPERLIPIDEMIA TYPE I / IV 03/14/2009   HYPERLIPIDEMIA-MIXED 03/14/2009   Essential hypertension 03/14/2009   Past Medical History:  Diagnosis Date   Allergy    Arthritis    Cancer (HCC)    skin   Elevated lipids    History of kidney stones    X 2 PASSED ON OWN   Hyperlipidemia    Hypertension    Lumbar pars defect    Pulmonary  nodules    Thoracic aortic aneurysm without rupture    STABLE 4.1 CM STABLE PER DR BATRLE 05-26-17 EPIC NOTE    Past Surgical History:  Procedure Laterality Date   COLONOSCOPY     HERNIA REPAIR  1975   INGUINAL   MENISCUS REPAIR RIGHT KNEE  2016   SHOULDER SURGERY Right 2002   ROTATOR CUFF    TOTAL KNEE ARTHROPLASTY Right 07/21/2017   Procedure: RIGHT TOTAL KNEE ARTHROPLASTY, PERTIAL PATELLECTOMY, REPAIR QUADRICEPS TENDON;  Surgeon: Duwayne Purchase, MD;  Location: [facility];  Service: Orthopedics;  Laterality: Right;  120 mins   TOTAL SHOULDER ARTHROPLASTY Left 03/22/2023    Current Outpatient Medications  Medication Sig Dispense Refill Last Dose/Taking   benazepril  (LOTENSIN ) 20 MG tablet Take 1 tablet by mouth once daily 90 tablet 0    rosuvastatin  (CRESTOR ) 20 MG tablet TAKE 1 TABLET BY MOUTH ONCE DAILY . APPOINTMENT REQUIRED FOR FUTURE REFILLS 90 tablet 0    No current facility-administered medications for this visit.   Allergies  Allergen Reactions   Latex Hives    Social History   Tobacco Use   Smoking status: Never   Smokeless tobacco: Never  Substance Use Topics   Alcohol  use: Not Currently    Comment: occ    Family History  Problem Relation Age of Onset   Cancer Mother        stomach   Heart disease Father    Stroke Father      Review of Systems  Musculoskeletal:  Positive for arthralgias, gait problem and joint swelling.  All  other systems reviewed and are negative.   Objective:  Physical Exam Constitutional:      Appearance: Normal appearance.  HENT:     Head: Normocephalic and atraumatic.     Nose: Nose normal.     Mouth/Throat:     Pharynx: Oropharynx is clear.  Eyes:     Conjunctiva/sclera: Conjunctivae normal.  Cardiovascular:     Rate and Rhythm: Normal rate and regular rhythm.     Pulses: Normal pulses.     Heart sounds: Normal heart sounds.  Pulmonary:     Effort: Pulmonary effort is normal.     Breath sounds: Normal breath sounds.   Abdominal:     General: Abdomen is flat.     Palpations: Abdomen is soft.  Genitourinary:    Comments: Deferred. Musculoskeletal:     Cervical back: Normal range of motion and neck supple.  Skin:    General: Skin is warm and dry.     Capillary Refill: Capillary refill takes less than 2 seconds.  Neurological:     General: No focal deficit present.     Mental Status: He is alert and oriented to person, place, and time.  Psychiatric:        Mood and Affect: Mood normal.        Behavior: Behavior normal.        Thought Content: Thought content normal.        Judgment: Judgment normal.     Vital signs in last 24 hours: @VSRANGES @  Labs:   Estimated body mass index is 32.46 kg/m as calculated from the following:   Height as of 11/24/23: 5' 9.5 (1.765 m).   Weight as of 11/24/23: 101.2 kg.   Imaging Review Plain radiographs demonstrate severe degenerative joint disease of the left knee(s). The overall alignment issignificant varus. The bone quality appears to be adequate for age and reported activity level.      Assessment/Plan:  End stage arthritis,, and subchondral insufficiency fracture medial femoral condyle left knee   The patient history, physical examination, clinical judgment of the provider and imaging studies are consistent with end stage degenerative joint disease of the left knee(s) and total knee arthroplasty is deemed medically necessary. The treatment options including medical management, injection therapy arthroscopy and arthroplasty were discussed at length. The risks and benefits of total knee arthroplasty were presented and reviewed. The risks due to aseptic loosening, infection, stiffness, patella tracking problems, thromboembolic complications and other imponderables were discussed. The patient acknowledged the explanation, agreed to proceed with the plan and consent was signed. Patient is being admitted for inpatient treatment for surgery, pain control,  PT, OT, prophylactic antibiotics, VTE prophylaxis, progressive ambulation and ADL's and discharge planning. The patient is planning to be discharged home with OPPT after an overnight stay  Therapy Plans: outpatient therapy. EO [facility] 12/06/23.  Disposition: Home with wife Planned DVT Prophylaxis: aspirin  81mg  BID DME needed: Has rolling walker.  PCP: Cleared.  TXA: IV Allergies:  - Latex or powder- hives.  Anesthesia Concerns: None.  BMI: 33.2 Last HgbA1c: not diabetic.  Other: - Venous insufficiency.  - Oxycodone , zofran .  [facility] - 11/24/23: Hb 13.5, K+ 3.9, Cr. 1.08.    Patient's anticipated LOS is less than 2 midnights, meeting these requirements: - Younger than 33 - Lives within 1 hour of care - Has a competent adult at home to recover with post-op recover - NO history of  - Chronic pain requiring opiods  - Diabetes  -  Coronary Artery Disease  - Heart failure  - Heart attack  - Stroke  - DVT/VTE  - Cardiac arrhythmia  - Respiratory Failure/COPD  - Renal failure  - Anemia  - Advanced Liver disease

## 2023-12-01 ENCOUNTER — Ambulatory Visit (HOSPITAL_BASED_OUTPATIENT_CLINIC_OR_DEPARTMENT_OTHER): Payer: Self-pay | Admitting: Anesthesiology

## 2023-12-01 ENCOUNTER — Ambulatory Visit (HOSPITAL_COMMUNITY)
Admission: RE | Admit: 2023-12-01 | Discharge: 2023-12-02 | Disposition: A | Discharge status: Home / Self Care | Source: Ambulatory Visit | Attending: Orthopedic Surgery | Admitting: Orthopedic Surgery

## 2023-12-01 ENCOUNTER — Ambulatory Visit (HOSPITAL_COMMUNITY): Payer: Self-pay | Admitting: Physician Assistant

## 2023-12-01 ENCOUNTER — Encounter (HOSPITAL_COMMUNITY)
Admission: RE | Disposition: A | Discharge status: Home / Self Care | Payer: Self-pay | Source: Ambulatory Visit | Attending: Orthopedic Surgery

## 2023-12-01 ENCOUNTER — Ambulatory Visit (HOSPITAL_COMMUNITY)

## 2023-12-01 ENCOUNTER — Encounter (HOSPITAL_COMMUNITY): Payer: Self-pay | Admitting: Orthopedic Surgery

## 2023-12-01 ENCOUNTER — Other Ambulatory Visit (HOSPITAL_COMMUNITY): Payer: Self-pay

## 2023-12-01 DIAGNOSIS — M1712 Unilateral primary osteoarthritis, left knee: Secondary | ICD-10-CM | POA: Diagnosis not present

## 2023-12-01 DIAGNOSIS — M4310 Spondylolisthesis, site unspecified: Secondary | ICD-10-CM | POA: Insufficient documentation

## 2023-12-01 DIAGNOSIS — Z96651 Presence of right artificial knee joint: Secondary | ICD-10-CM | POA: Diagnosis not present

## 2023-12-01 DIAGNOSIS — E785 Hyperlipidemia, unspecified: Secondary | ICD-10-CM | POA: Diagnosis not present

## 2023-12-01 DIAGNOSIS — R918 Other nonspecific abnormal finding of lung field: Secondary | ICD-10-CM | POA: Diagnosis not present

## 2023-12-01 DIAGNOSIS — I10 Essential (primary) hypertension: Secondary | ICD-10-CM

## 2023-12-01 DIAGNOSIS — E782 Mixed hyperlipidemia: Secondary | ICD-10-CM | POA: Diagnosis not present

## 2023-12-01 DIAGNOSIS — M84452A Pathological fracture, left femur, initial encounter for fracture: Secondary | ICD-10-CM | POA: Diagnosis not present

## 2023-12-01 DIAGNOSIS — Z96652 Presence of left artificial knee joint: Secondary | ICD-10-CM

## 2023-12-01 DIAGNOSIS — Z79899 Other long term (current) drug therapy: Secondary | ICD-10-CM | POA: Diagnosis not present

## 2023-12-01 DIAGNOSIS — G8918 Other acute postprocedural pain: Secondary | ICD-10-CM | POA: Diagnosis not present

## 2023-12-01 HISTORY — PX: KNEE ARTHROPLASTY: SHX992

## 2023-12-01 SURGERY — ARTHROPLASTY, KNEE, TOTAL, USING IMAGELESS COMPUTER-ASSISTED NAVIGATION
Anesthesia: Spinal | Site: Knee | Laterality: Left

## 2023-12-01 MED ORDER — HYDROMORPHONE HCL 1 MG/ML IJ SOLN: 0.5000 mg | INTRAMUSCULAR | Status: DC | PRN

## 2023-12-01 MED ORDER — LIDOCAINE HCL (PF) 2 % IJ SOLN
INTRAMUSCULAR | Status: AC
  Filled 2023-12-01: qty 5

## 2023-12-01 MED ORDER — POVIDONE-IODINE 10 % EX SWAB: 2.0000 | Freq: Once | CUTANEOUS | Status: DC

## 2023-12-01 MED ORDER — FENTANYL CITRATE (PF) 50 MCG/ML IJ SOSY: 25.0000 ug | PREFILLED_SYRINGE | INTRAMUSCULAR | Status: DC | PRN

## 2023-12-01 MED ORDER — LACTATED RINGERS IV SOLN: INTRAVENOUS | Status: DC

## 2023-12-01 MED ORDER — METHOCARBAMOL 1000 MG/10ML IJ SOLN: 500.0000 mg | Freq: Four times a day (QID) | INTRAMUSCULAR | Status: DC | PRN

## 2023-12-01 MED ORDER — SENNA 8.6 MG PO TABS
1.0000 | ORAL_TABLET | Freq: Two times a day (BID) | ORAL | Status: DC
  Administered 2023-12-01 – 2023-12-02 (×2): 8.6 mg via ORAL
  Filled 2023-12-01 (×2): qty 1

## 2023-12-01 MED ORDER — METOCLOPRAMIDE HCL 5 MG/ML IJ SOLN: 5.0000 mg | Freq: Three times a day (TID) | INTRAMUSCULAR | Status: DC | PRN

## 2023-12-01 MED ORDER — ACETAMINOPHEN 500 MG PO TABS: 1000.0000 mg | ORAL_TABLET | Freq: Once | ORAL | Status: DC

## 2023-12-01 MED ORDER — MEPERIDINE HCL 25 MG/ML IJ SOLN: 6.2500 mg | INTRAMUSCULAR | Status: DC | PRN

## 2023-12-01 MED ORDER — CEFAZOLIN SODIUM-DEXTROSE 2-4 GM/100ML-% IV SOLN
2.0000 g | Freq: Four times a day (QID) | INTRAVENOUS | Status: AC
  Administered 2023-12-01 – 2023-12-02 (×2): 2 g via INTRAVENOUS
  Filled 2023-12-01 (×2): qty 100

## 2023-12-01 MED ORDER — ASPIRIN 81 MG PO CHEW
81.0000 mg | CHEWABLE_TABLET | Freq: Two times a day (BID) | ORAL | 0 refills | Status: DC
  Filled 2023-12-01: qty 90, 45d supply, fill #0

## 2023-12-01 MED ORDER — BISACODYL 10 MG RE SUPP: 10.0000 mg | Freq: Every day | RECTAL | Status: DC | PRN

## 2023-12-01 MED ORDER — DEXAMETHASONE SOD PHOSPHATE PF 10 MG/ML IJ SOLN
INTRAMUSCULAR | Status: DC | PRN
  Administered 2023-12-01: 10 mg via PERINEURAL

## 2023-12-01 MED ORDER — ONDANSETRON HCL 4 MG/2ML IJ SOLN: 4.0000 mg | Freq: Four times a day (QID) | INTRAMUSCULAR | Status: DC | PRN

## 2023-12-01 MED ORDER — ONDANSETRON HCL 4 MG/2ML IJ SOLN
INTRAMUSCULAR | Status: AC
  Filled 2023-12-01: qty 2

## 2023-12-01 MED ORDER — OXYCODONE HCL 5 MG PO TABS: 10.0000 mg | ORAL_TABLET | ORAL | Status: DC | PRN

## 2023-12-01 MED ORDER — BUPIVACAINE-EPINEPHRINE (PF) 0.25% -1:200000 IJ SOLN
INTRAMUSCULAR | Status: AC
  Filled 2023-12-01: qty 30

## 2023-12-01 MED ORDER — PROPOFOL 1000 MG/100ML IV EMUL
INTRAVENOUS | Status: AC
Start: 2023-12-01 — End: 2023-12-01
  Filled 2023-12-01: qty 100

## 2023-12-01 MED ORDER — SODIUM CHLORIDE 0.9 % IR SOLN
Status: DC | PRN
  Administered 2023-12-01: 3000 mL
  Administered 2023-12-01: 2580 mL

## 2023-12-01 MED ORDER — METOCLOPRAMIDE HCL 5 MG PO TABS: 5.0000 mg | ORAL_TABLET | Freq: Three times a day (TID) | ORAL | Status: DC | PRN

## 2023-12-01 MED ORDER — ORAL CARE MOUTH RINSE: 15.0000 mL | Freq: Once | OROMUCOSAL | Status: AC

## 2023-12-01 MED ORDER — MENTHOL 3 MG MT LOZG: 1.0000 | LOZENGE | OROMUCOSAL | Status: DC | PRN

## 2023-12-01 MED ORDER — ROPIVACAINE HCL 5 MG/ML IJ SOLN
INTRAMUSCULAR | Status: DC | PRN
  Administered 2023-12-01: 25 mL via PERINEURAL

## 2023-12-01 MED ORDER — PROPOFOL 10 MG/ML IV BOLUS
INTRAVENOUS | Status: AC
  Filled 2023-12-01: qty 20

## 2023-12-01 MED ORDER — ONDANSETRON HCL 4 MG PO TABS
4.0000 mg | ORAL_TABLET | Freq: Three times a day (TID) | ORAL | 0 refills | Status: DC | PRN
  Filled 2023-12-01: qty 30, 10d supply, fill #0

## 2023-12-01 MED ORDER — CEFAZOLIN SODIUM-DEXTROSE 2-4 GM/100ML-% IV SOLN
2.0000 g | INTRAVENOUS | Status: AC
  Administered 2023-12-01: 2 g via INTRAVENOUS
  Filled 2023-12-01: qty 100

## 2023-12-01 MED ORDER — KETOROLAC TROMETHAMINE 30 MG/ML IJ SOLN
INTRAMUSCULAR | Status: AC
  Filled 2023-12-01: qty 1

## 2023-12-01 MED ORDER — KETOROLAC TROMETHAMINE 15 MG/ML IJ SOLN
7.5000 mg | Freq: Four times a day (QID) | INTRAMUSCULAR | Status: DC
  Administered 2023-12-01 – 2023-12-02 (×2): 7.5 mg via INTRAVENOUS
  Filled 2023-12-01 (×2): qty 1

## 2023-12-01 MED ORDER — POLYETHYLENE GLYCOL 3350 17 GM/SCOOP PO POWD
17.0000 g | Freq: Every day | ORAL | 0 refills | Status: DC | PRN
  Filled 2023-12-01: qty 238, 14d supply, fill #0

## 2023-12-01 MED ORDER — MIDAZOLAM HCL (PF) 2 MG/2ML IJ SOLN
1.0000 mg | Freq: Once | INTRAMUSCULAR | Status: AC
  Administered 2023-12-01: 2 mg via INTRAVENOUS
  Filled 2023-12-01: qty 2

## 2023-12-01 MED ORDER — CELECOXIB 200 MG PO CAPS
200.0000 mg | ORAL_CAPSULE | Freq: Once | ORAL | Status: AC
  Administered 2023-12-01: 200 mg via ORAL
  Filled 2023-12-01: qty 1

## 2023-12-01 MED ORDER — POVIDONE-IODINE 10 % EX SWAB
2.0000 | Freq: Once | CUTANEOUS | Status: AC
  Administered 2023-12-01: 2 via TOPICAL

## 2023-12-01 MED ORDER — METHOCARBAMOL 500 MG PO TABS
500.0000 mg | ORAL_TABLET | Freq: Four times a day (QID) | ORAL | Status: DC | PRN
  Administered 2023-12-01: 500 mg via ORAL
  Filled 2023-12-01: qty 1

## 2023-12-01 MED ORDER — ONDANSETRON HCL 4 MG/2ML IJ SOLN
INTRAMUSCULAR | Status: DC | PRN
  Administered 2023-12-01: 4 mg via INTRAVENOUS

## 2023-12-01 MED ORDER — ONDANSETRON HCL 4 MG/2ML IJ SOLN
4.0000 mg | Freq: Once | INTRAMUSCULAR | Status: DC | PRN
Start: 2023-12-01 — End: 2023-12-01

## 2023-12-01 MED ORDER — FENTANYL CITRATE (PF) 50 MCG/ML IJ SOSY
50.0000 ug | PREFILLED_SYRINGE | Freq: Once | INTRAMUSCULAR | Status: AC
Start: 2023-12-01 — End: 2023-12-01
  Administered 2023-12-01: 100 ug via INTRAVENOUS
  Filled 2023-12-01: qty 2

## 2023-12-01 MED ORDER — ISOPROPYL ALCOHOL 70 % SOLN
Status: DC | PRN
  Administered 2023-12-01: 1 via TOPICAL

## 2023-12-01 MED ORDER — SODIUM CHLORIDE (PF) 0.9 % IJ SOLN
INTRAMUSCULAR | Status: DC | PRN
  Administered 2023-12-01: 61 mL

## 2023-12-01 MED ORDER — PHENOL 1.4 % MT LIQD
1.0000 | OROMUCOSAL | Status: DC | PRN
Start: 2023-12-01 — End: 2023-12-02

## 2023-12-01 MED ORDER — CELECOXIB 200 MG PO CAPS: 200.0000 mg | ORAL_CAPSULE | Freq: Once | ORAL | Status: DC

## 2023-12-01 MED ORDER — DIPHENHYDRAMINE HCL 12.5 MG/5ML PO ELIX: 12.5000 mg | ORAL_SOLUTION | ORAL | Status: DC | PRN

## 2023-12-01 MED ORDER — DOCUSATE SODIUM 100 MG PO CAPS
100.0000 mg | ORAL_CAPSULE | Freq: Two times a day (BID) | ORAL | Status: DC
  Administered 2023-12-01 – 2023-12-02 (×2): 100 mg via ORAL
  Filled 2023-12-01 (×2): qty 1

## 2023-12-01 MED ORDER — ALUM & MAG HYDROXIDE-SIMETH 200-200-20 MG/5ML PO SUSP: 30.0000 mL | ORAL | Status: DC | PRN

## 2023-12-01 MED ORDER — PHENYLEPHRINE HCL-NACL 20-0.9 MG/250ML-% IV SOLN
INTRAVENOUS | Status: DC | PRN
  Administered 2023-12-01: 25 ug/min via INTRAVENOUS

## 2023-12-01 MED ORDER — DOCUSATE SODIUM 100 MG PO CAPS
100.0000 mg | ORAL_CAPSULE | Freq: Two times a day (BID) | ORAL | 0 refills | Status: DC
  Filled 2023-12-01: qty 60, 30d supply, fill #0

## 2023-12-01 MED ORDER — ROCURONIUM BROMIDE 10 MG/ML (PF) SYRINGE
PREFILLED_SYRINGE | INTRAVENOUS | Status: AC
  Filled 2023-12-01: qty 10

## 2023-12-01 MED ORDER — TRANEXAMIC ACID-NACL 1000-0.7 MG/100ML-% IV SOLN
1000.0000 mg | INTRAVENOUS | Status: AC
  Administered 2023-12-01: 1000 mg via INTRAVENOUS
  Filled 2023-12-01: qty 100

## 2023-12-01 MED ORDER — POLYETHYLENE GLYCOL 3350 17 G PO PACK: 17.0000 g | PACK | Freq: Every day | ORAL | Status: DC | PRN

## 2023-12-01 MED ORDER — ACETAMINOPHEN 500 MG PO TABS
1000.0000 mg | ORAL_TABLET | Freq: Once | ORAL | Status: AC
  Administered 2023-12-01: 1000 mg via ORAL
  Filled 2023-12-01: qty 2

## 2023-12-01 MED ORDER — CHLORHEXIDINE GLUCONATE 0.12 % MT SOLN
15.0000 mL | Freq: Once | OROMUCOSAL | Status: AC
  Administered 2023-12-01: 15 mL via OROMUCOSAL

## 2023-12-01 MED ORDER — PROPOFOL 1000 MG/100ML IV EMUL
INTRAVENOUS | Status: AC
  Filled 2023-12-01: qty 100

## 2023-12-01 MED ORDER — BUPIVACAINE IN DEXTROSE 0.75-8.25 % IT SOLN
INTRATHECAL | Status: DC | PRN
  Administered 2023-12-01: 2 mL via INTRATHECAL

## 2023-12-01 MED ORDER — OXYCODONE HCL 5 MG PO TABS: 5.0000 mg | ORAL_TABLET | Freq: Once | ORAL | Status: DC | PRN

## 2023-12-01 MED ORDER — OXYCODONE HCL 5 MG PO TABS
5.0000 mg | ORAL_TABLET | ORAL | 0 refills | Status: DC | PRN
  Filled 2023-12-01: qty 42, 7d supply, fill #0

## 2023-12-01 MED ORDER — ASPIRIN 81 MG PO CHEW
81.0000 mg | CHEWABLE_TABLET | Freq: Two times a day (BID) | ORAL | Status: DC
  Administered 2023-12-01 – 2023-12-02 (×2): 81 mg via ORAL
  Filled 2023-12-01 (×2): qty 1

## 2023-12-01 MED ORDER — SODIUM CHLORIDE (PF) 0.9 % IJ SOLN
INTRAMUSCULAR | Status: AC
  Filled 2023-12-01: qty 150

## 2023-12-01 MED ORDER — ROSUVASTATIN CALCIUM 20 MG PO TABS
20.0000 mg | ORAL_TABLET | Freq: Every day | ORAL | Status: DC
  Administered 2023-12-01 – 2023-12-02 (×2): 20 mg via ORAL
  Filled 2023-12-01 (×2): qty 1

## 2023-12-01 MED ORDER — 0.9 % SODIUM CHLORIDE (POUR BTL) OPTIME
TOPICAL | Status: DC | PRN
  Administered 2023-12-01: 1000 mL

## 2023-12-01 MED ORDER — PHENYLEPHRINE 80 MCG/ML (10ML) SYRINGE FOR IV PUSH (FOR BLOOD PRESSURE SUPPORT)
PREFILLED_SYRINGE | INTRAVENOUS | Status: DC | PRN
  Administered 2023-12-01: 80 ug via INTRAVENOUS

## 2023-12-01 MED ORDER — ONDANSETRON HCL 4 MG PO TABS: 4.0000 mg | ORAL_TABLET | Freq: Four times a day (QID) | ORAL | Status: DC | PRN

## 2023-12-01 MED ORDER — OXYCODONE HCL 5 MG/5ML PO SOLN: 5.0000 mg | Freq: Once | ORAL | Status: DC | PRN

## 2023-12-01 MED ORDER — OXYCODONE HCL 5 MG PO TABS
5.0000 mg | ORAL_TABLET | ORAL | Status: DC | PRN
  Administered 2023-12-01: 5 mg via ORAL
  Filled 2023-12-01: qty 1

## 2023-12-01 MED ORDER — LACTATED RINGERS IV SOLN: INTRAVENOUS | Status: DC | PRN

## 2023-12-01 MED ORDER — STERILE WATER FOR IRRIGATION IR SOLN
Status: DC | PRN
  Administered 2023-12-01: 2000 mL

## 2023-12-01 MED ORDER — PHENYLEPHRINE 80 MCG/ML (10ML) SYRINGE FOR IV PUSH (FOR BLOOD PRESSURE SUPPORT)
PREFILLED_SYRINGE | INTRAVENOUS | Status: AC
  Filled 2023-12-01: qty 10

## 2023-12-01 MED ORDER — PROPOFOL 10 MG/ML IV BOLUS
INTRAVENOUS | Status: DC | PRN
  Administered 2023-12-01: 30 mg via INTRAVENOUS
  Administered 2023-12-01: 125 ug/kg/min via INTRAVENOUS

## 2023-12-01 MED ORDER — ACETAMINOPHEN 325 MG PO TABS: 325.0000 mg | ORAL_TABLET | Freq: Four times a day (QID) | ORAL | Status: DC | PRN

## 2023-12-01 MED ORDER — SODIUM CHLORIDE 0.9 % IV SOLN: INTRAVENOUS | Status: DC

## 2023-12-01 MED ORDER — PANTOPRAZOLE SODIUM 40 MG PO TBEC
40.0000 mg | DELAYED_RELEASE_TABLET | Freq: Every day | ORAL | Status: DC
  Administered 2023-12-01 – 2023-12-02 (×2): 40 mg via ORAL
  Filled 2023-12-01 (×2): qty 1

## 2023-12-01 MED ORDER — SENNA 8.6 MG PO TABS
2.0000 | ORAL_TABLET | Freq: Every day | ORAL | 0 refills | Status: DC
  Filled 2023-12-01: qty 30, 15d supply, fill #0

## 2023-12-01 SURGICAL SUPPLY — 60 items
BAG COUNTER SPONGE SURGICOUNT (BAG) IMPLANT
BAG ZIPLOCK 12X15 (MISCELLANEOUS) ×1 IMPLANT
BATTERY INSTRU NAVIGATION (MISCELLANEOUS) ×3 IMPLANT
BLADE SAGITTAL AGGR TOOTH XLG (BLADE) ×1 IMPLANT
BLADE SAW RECIPROCATING 77.5 (BLADE) ×1 IMPLANT
BLADE SAW SAG 35X64 .89 (BLADE) ×1 IMPLANT
BNDG ELASTIC 4INX 5YD STR LF (GAUZE/BANDAGES/DRESSINGS) ×1 IMPLANT
BNDG ELASTIC 6INX 5YD STR LF (GAUZE/BANDAGES/DRESSINGS) ×1 IMPLANT
BNDG ELASTIC 6X10 VLCR STRL LF (GAUZE/BANDAGES/DRESSINGS) IMPLANT
BOWL SMART MIX CTS (DISPOSABLE) IMPLANT
CEMENT BONE R 1X40 (Cement) IMPLANT
CHLORAPREP W/TINT 26 (MISCELLANEOUS) ×2 IMPLANT
COMPONENT FEM CMT PERS SZ11LT (Joint) IMPLANT
COMPONENT PATELLA 3 PEG 38 (Joint) IMPLANT
COMPONENT TIB KNEE PS 0D LT (Joint) IMPLANT
COVER SURGICAL LIGHT HANDLE (MISCELLANEOUS) ×1 IMPLANT
DERMABOND ADVANCED .7 DNX12 (GAUZE/BANDAGES/DRESSINGS) ×2 IMPLANT
DRAPE SHEET LG 3/4 BI-LAMINATE (DRAPES) ×3 IMPLANT
DRAPE U-SHAPE 47X51 STRL (DRAPES) ×1 IMPLANT
DRSG AQUACEL AG ADV 3.5X10 (GAUZE/BANDAGES/DRESSINGS) ×1 IMPLANT
DRSG AQUACEL AG ADV 3.5X14 (GAUZE/BANDAGES/DRESSINGS) IMPLANT
ELECT BLADE TIP CTD 4 INCH (ELECTRODE) ×1 IMPLANT
ELECT PENCIL ROCKER SW 15FT (MISCELLANEOUS) ×1 IMPLANT
ELECT REM PT RETURN 15FT ADLT (MISCELLANEOUS) ×1 IMPLANT
GAUZE SPONGE 4X4 12PLY STRL (GAUZE/BANDAGES/DRESSINGS) ×1 IMPLANT
GLOVE BIO SURGEON STRL SZ7 (GLOVE) ×1 IMPLANT
GLOVE BIO SURGEON STRL SZ8.5 (GLOVE) ×2 IMPLANT
GLOVE BIOGEL PI IND STRL 7.5 (GLOVE) ×1 IMPLANT
GLOVE BIOGEL PI IND STRL 8.5 (GLOVE) ×1 IMPLANT
GOWN SPEC L3 XXLG W/TWL (GOWN DISPOSABLE) ×1 IMPLANT
GOWN STRL REUS W/ TWL XL LVL3 (GOWN DISPOSABLE) ×1 IMPLANT
HOLDER FOLEY CATH W/STRAP (MISCELLANEOUS) ×1 IMPLANT
HOOD PEEL AWAY T7 (MISCELLANEOUS) ×3 IMPLANT
INSERT TIB ASF EF/3-11 10 LT (Insert) IMPLANT
KIT TURNOVER KIT A (KITS) ×1 IMPLANT
MARKER SKIN DUAL TIP RULER LAB (MISCELLANEOUS) ×1 IMPLANT
NDL SAFETY ECLIPSE 18X1.5 (NEEDLE) ×1 IMPLANT
NDL SPNL 18GX3.5 QUINCKE PK (NEEDLE) ×1 IMPLANT
NS IRRIG 1000ML POUR BTL (IV SOLUTION) ×1 IMPLANT
PACK TOTAL KNEE CUSTOM (KITS) ×1 IMPLANT
PADDING CAST COTTON 6X4 STRL (CAST SUPPLIES) ×1 IMPLANT
PIN DRILL HDLS TROCAR 75 4PK (PIN) IMPLANT
PROTECTOR NERVE ULNAR (MISCELLANEOUS) ×1 IMPLANT
SCREW FEMALE HEX FIX 25X2.5 (ORTHOPEDIC DISPOSABLE SUPPLIES) IMPLANT
SEALER BIPOLAR AQUA 6.0 (INSTRUMENTS) ×1 IMPLANT
SET HNDPC FAN SPRY TIP SCT (DISPOSABLE) ×1 IMPLANT
SET PAD KNEE POSITIONER (MISCELLANEOUS) ×1 IMPLANT
SOLUTION PRONTOSAN WOUND 350ML (IRRIGATION / IRRIGATOR) ×1 IMPLANT
SPIKE FLUID TRANSFER (MISCELLANEOUS) ×2 IMPLANT
SUT MNCRL AB 3-0 PS2 18 (SUTURE) ×1 IMPLANT
SUT MON AB 2-0 CT1 36 (SUTURE) ×1 IMPLANT
SUT STRATAFIX 14 PDO 48 VLT (SUTURE) ×1 IMPLANT
SUT VIC AB 1 CTX36XBRD ANBCTR (SUTURE) ×2 IMPLANT
SUT VIC AB 2-0 CT1 TAPERPNT 27 (SUTURE) ×1 IMPLANT
SYR 3ML LL SCALE MARK (SYRINGE) ×1 IMPLANT
TOWEL GREEN STERILE FF (TOWEL DISPOSABLE) ×1 IMPLANT
TRAY FOLEY MTR SLVR 16FR STAT (SET/KITS/TRAYS/PACK) ×1 IMPLANT
TUBE SUCTION HIGH CAP CLEAR NV (SUCTIONS) ×1 IMPLANT
WATER STERILE IRR 1000ML POUR (IV SOLUTION) ×2 IMPLANT
WRAP KNEE MAXI GEL POST OP (GAUZE/BANDAGES/DRESSINGS) ×1 IMPLANT

## 2023-12-01 NOTE — Op Note (Signed)
 OPERATIVE REPORT  SURGEON: Redell Shoals, MD   ASSISTANT: Valery Potters, PA-C  PREOPERATIVE DIAGNOSIS: Left knee arthritis, medial femoral condyle subchondral fracture.   POSTOPERATIVE DIAGNOSIS: Same.  PROCEDURE: Computer assisted Left total knee arthroplasty, hybrid fixation.   IMPLANTS: Zimmer Persona Cemented CR femur, size 11. Persona 0 degree Spiked Keel OsseoTi Tibia, size F. Vivacit-E polyethelyene insert, size 10 mm, CR. OsseoTi 3-Peg patella, size 38 mm. Biomet bone cement.  ANESTHESIA:  MAC, Regional, and Spinal  TOURNIQUET TIME: Not utilized.   ESTIMATED BLOOD LOSS:-100 mL    ANTIBIOTICS: 2g Ancef .  DRAINS: None.  COMPLICATIONS: None   CONDITION: PACU - hemodynamically stable.   BRIEF CLINICAL NOTE: Shane Robinson is a 73 y.o. male with a long-standing history of Left knee arthritis. After failing conservative management, the patient was indicated for total knee arthroplasty. The risks, benefits, and alternatives to the procedure were explained, and the patient elected to proceed.  PROCEDURE IN DETAIL: Adductor canal block was obtained in the pre-op holding area. Once inside the operative room, spinal anesthesia was obtained, and a foley catheter was inserted. The patient was then positioned and the lower extremity was prepped and draped in the normal sterile surgical fashion.  A time-out was called verifying side and site of surgery. The patient received IV antibiotics within 60 minutes of beginning the procedure. A tourniquet was not utilized.   An anterior approach to the knee was performed utilizing a midvastus arthrotomy. A medial release was performed and the patellar fat pad was excised. Stryker imageless navigation was used to cut the distal femur perpendicular to the mechanical axis. A freehand patellar resection was performed, and the patella was sized and prepared with 3 lug holes.  Nagivation was used to make a neutral proximal tibia resection, taking 9 mm  of bone from the less affected lateral side with 3 degrees of slope. The menisci were excised. A spacer block was placed, and the alignment and balance in extension were confirmed.   The distal femur was sized using the 3-degree external rotation guide referencing the posterior femoral cortex. The appropriate 4-in-1 cutting block was pinned into place. Rotation was checked using Whiteside's line, the epicondylar axis, and then confirmed with a spacer block in flexion. The remaining femoral cuts were performed, taking care to protect the MCL. The medial femoral condyle subchondral fracture site consisted of a contained defect, which I curetted to a stable bony base.  The tibia was sized and the trial tray was pinned into place. The remaining trail components were inserted. The knee was stable to varus and valgus stress through a full range of motion. The patella tracked centrally, and the PCL was well balanced. The trial components were removed, and the proximal tibial surface was prepared. Final tibial components were impacted into place. The femoral component was cemented into place.  The knee was brought into extension while the cement polymerized.  Excess cement was cleared. The knee was tested for a final time and found to be well balanced.   The wound was copiously irrigated with Prontosan solution and normal saline using pulse lavage.  Marcaine  solution was injected into the periarticular soft tissue.  The wound was closed in layers using #1 Vicryl and Stratafix for the fascia, 2-0 Vicryl for the subcutaneous fat, 2-0 Monocryl for the deep dermal layer, 3-0 running Monocryl subcuticular Stitch, and 4-0 Monocryl stay sutures at both ends of the wound. Dermabond was applied to the skin.  Once the glue was fully dried,  an Aquacell Ag and compressive dressing were applied.  The patient was transported to the recovery room in stable condition.  Sponge, needle, and instrument counts were correct at the end of  the case x2.  The patient tolerated the procedure well and there were no known complications.  Please note that a surgical assistant was a medical necessity for this procedure in order to perform it in a safe and expeditious manner. Surgical assistant was necessary to retract the ligaments and vital neurovascular structures to prevent injury to them and also necessary for proper positioning of the limb to allow for anatomic placement of the prosthesis.

## 2023-12-01 NOTE — Transfer of Care (Signed)
 Immediate Anesthesia Transfer of Care Note  Patient: Shane Robinson  Procedure(s) Performed: Procedure(s): LEFT TOTAL KNEE ARTHROPLASTY USING IMAGELESS COMPUTER-ASSISTED NAVIGATION (Left)  Patient Location: PACU  Anesthesia Type:Spinal  Level of Consciousness:  sedated, patient cooperative and responds to stimulation  Airway & Oxygen  Therapy:Patient Spontanous Breathing and Patient connected to face mask oxgen  Post-op Assessment:  Report given to PACU RN and Post -op Vital signs reviewed and stable  Post vital signs:  Reviewed and stable  Last Vitals:  Vitals:   12/01/23 1305 12/01/23 1310  BP: (!) 159/90 131/83  Pulse: (!) 56 63  Resp: (!) 27 (!) 22  Temp:    SpO2: 97% 94%    Complications: No apparent anesthesia complications

## 2023-12-01 NOTE — Anesthesia Postprocedure Evaluation (Signed)
 Anesthesia Post Note  Patient: Shane Robinson  Procedure(s) Performed: LEFT TOTAL KNEE ARTHROPLASTY USING IMAGELESS COMPUTER-ASSISTED NAVIGATION (Left: Knee)     Patient location during evaluation: PACU Anesthesia Type: Spinal Level of consciousness: oriented and awake and alert Pain management: pain level controlled Vital Signs Assessment: post-procedure vital signs reviewed and stable Respiratory status: spontaneous breathing, respiratory function stable and nonlabored ventilation Cardiovascular status: blood pressure returned to baseline and stable Postop Assessment: no headache, no backache, no apparent nausea or vomiting and spinal receding Anesthetic complications: no   No notable events documented.  Last Vitals:  Vitals:   12/01/23 1700 12/01/23 1715  BP: 107/75 94/75  Pulse: (!) 56 61  Resp: 20 19  Temp:    SpO2: 99% 94%    Last Pain:  Vitals:   12/01/23 1715  TempSrc:   PainSc: Asleep                 Tania Steinhauser A.

## 2023-12-01 NOTE — Anesthesia Procedure Notes (Signed)
 Date/Time: 12/01/2023 2:09 PM  Performed by: Para Jerelene CROME, CRNAOxygen Delivery Method: Simple face mask

## 2023-12-01 NOTE — Interval H&P Note (Signed)
 History and Physical Interval Note:  12/01/2023 12:54 PM  Shane Robinson  has presented today for surgery, with the diagnosis of Left knee osteoarthritis.  The various methods of treatment have been discussed with the patient and family. After consideration of risks, benefits and other options for treatment, the patient has consented to  Procedure(s): ARTHROPLASTY, KNEE, TOTAL, USING IMAGELESS COMPUTER-ASSISTED NAVIGATION (Left) as a surgical intervention.  The patient's history has been reviewed, patient examined, no change in status, stable for surgery.  I have reviewed the patient's chart and labs.  Questions were answered to the patient's satisfaction.     Redell PARAS Bray Vickerman

## 2023-12-01 NOTE — Discharge Instructions (Signed)

## 2023-12-01 NOTE — Anesthesia Procedure Notes (Signed)
 Anesthesia Regional Block: Adductor canal block   Pre-Anesthetic Checklist: , timeout performed,  Correct Patient, Correct Site, Correct Laterality,  Correct Procedure, Correct Position, site marked,  Risks and benefits discussed,  Surgical consent,  Pre-op evaluation,  At surgeon's request and post-op pain management  Laterality: Left  Prep: chloraprep       Needles:  Injection technique: Single-shot  Needle Type: Echogenic Stimulator Needle     Needle Length: 5cm  Needle Gauge: 22     Additional Needles:   Procedures:, nerve stimulator,,, ultrasound used (permanent image in chart),,    Narrative:  Start time: 12/01/2023 1:10 PM End time: 12/01/2023 1:17 PM Injection made incrementally with aspirations every 5 mL.  Performed by: Personally  Anesthesiologist: Mallory Manus, MD  Additional Notes: Functioning IV was confirmed and monitors were applied.  A 50mm 22ga Arrow echogenic stimulator needle was used. Sterile prep and drape,hand hygiene and sterile gloves were used. Ultrasound guidance: relevant anatomy identified, needle position confirmed, local anesthetic spread visualized around nerve(s)., vascular puncture avoided.  Image printed for medical record. Negative aspiration and negative test dose prior to incremental administration of local anesthetic. The patient tolerated the procedure well.

## 2023-12-01 NOTE — Care Plan (Signed)
 Ortho Bundle Case Management Note  Patient Details  Name: Shane Robinson MRN: [id number] Date of Birth: 08/06/1950  LT TKA on 12/01/23  DCP: Home with wife  DME: No needs; has RW  PT: [facility]                  DME Arranged:  N/A DME Agency:  NA  HH Arranged:    HH Agency:     Additional Comments: Please contact me with any questions of if this plan should need to change.  Burnard Dross, Case Manager [facility] [phone]  Ext. [phone]   12/01/2023, 12:24 PM

## 2023-12-01 NOTE — Anesthesia Preprocedure Evaluation (Addendum)
 Anesthesia Evaluation  Patient identified by MRN, date of birth, ID band Patient awake    Reviewed: Allergy & Precautions, H&P , NPO status , Patient's Chart, lab work & pertinent test results  Airway Mallampati: II  TM Distance: >3 FB Neck ROM: Full    Dental no notable dental hx. (+) Teeth Intact, Dental Advisory Given   Pulmonary neg pulmonary ROS   Pulmonary exam normal breath sounds clear to auscultation       Cardiovascular Exercise Tolerance: Good hypertension, negative cardio ROS Normal cardiovascular exam Rhythm:Regular Rate:Normal     Neuro/Psych negative neurological ROS  negative psych ROS   GI/Hepatic negative GI ROS, Neg liver ROS,,,  Endo/Other  negative endocrine ROS    Renal/GU negative Renal ROS  negative genitourinary   Musculoskeletal negative musculoskeletal ROS (+) Arthritis ,    Abdominal   Peds negative pediatric ROS (+)  Hematology negative hematology ROS (+)   Anesthesia Other Findings   Reproductive/Obstetrics negative OB ROS                              Anesthesia Physical Anesthesia Plan  ASA: 3  Anesthesia Plan: Spinal   Post-op Pain Management: Minimal or no pain anticipated   Induction: Intravenous  PONV Risk Score and Plan: 2 and Ondansetron  and Treatment may vary due to age or medical condition  Airway Management Planned: Natural Airway, Simple Face Mask and Nasal Cannula  Additional Equipment: None  Intra-op Plan:   Post-operative Plan:   Informed Consent: I have reviewed the patients History and Physical, chart, labs and discussed the procedure including the risks, benefits and alternatives for the proposed anesthesia with the patient or authorized representative who has indicated his/her understanding and acceptance.       Plan Discussed with: Anesthesiologist and CRNA  Anesthesia Plan Comments: (  )         Anesthesia  Quick Evaluation

## 2023-12-01 NOTE — Anesthesia Procedure Notes (Signed)
 Spinal  Patient location during procedure: OR Start time: 12/01/2023 2:00 PM End time: 12/01/2023 2:06 PM Reason for block: surgical anesthesia Staffing Performed: anesthesiologist  Anesthesiologist: Erma Thom SAUNDERS, MD Performed by: Erma Thom SAUNDERS, MD Authorized by: Erma Thom SAUNDERS, MD   Preanesthetic Checklist Completed: patient identified, IV checked, site marked, risks and benefits discussed, surgical consent, monitors and equipment checked, pre-op evaluation and timeout performed Spinal Block Patient position: sitting Prep: DuraPrep Patient monitoring: heart rate, cardiac monitor, continuous pulse ox and blood pressure Approach: midline Location: L4-5 Needle Needle type: Pencan  Needle gauge: 24 G Assessment Sensory level: T6 Additional Notes Functioning IV was confirmed and monitors were applied. Sterile prep and drape, including hand hygiene and sterile gloves were used. The patient was positioned and the spine was prepped. The skin was anesthetized with lidocaine .  Free flow of clear CSF was obtained prior to injecting local anesthetic into the CSF.  The spinal needle aspirated freely following injection.  The needle was carefully withdrawn.  The patient tolerated the procedure well.

## 2023-12-02 ENCOUNTER — Other Ambulatory Visit: Payer: Self-pay

## 2023-12-02 DIAGNOSIS — M84452A Pathological fracture, left femur, initial encounter for fracture: Secondary | ICD-10-CM | POA: Diagnosis not present

## 2023-12-02 DIAGNOSIS — E785 Hyperlipidemia, unspecified: Secondary | ICD-10-CM | POA: Diagnosis not present

## 2023-12-02 DIAGNOSIS — Z96651 Presence of right artificial knee joint: Secondary | ICD-10-CM | POA: Diagnosis not present

## 2023-12-02 DIAGNOSIS — M1712 Unilateral primary osteoarthritis, left knee: Secondary | ICD-10-CM | POA: Diagnosis not present

## 2023-12-02 DIAGNOSIS — R918 Other nonspecific abnormal finding of lung field: Secondary | ICD-10-CM | POA: Diagnosis not present

## 2023-12-02 DIAGNOSIS — Z79899 Other long term (current) drug therapy: Secondary | ICD-10-CM | POA: Diagnosis not present

## 2023-12-02 DIAGNOSIS — I10 Essential (primary) hypertension: Secondary | ICD-10-CM | POA: Diagnosis not present

## 2023-12-02 DIAGNOSIS — M4310 Spondylolisthesis, site unspecified: Secondary | ICD-10-CM | POA: Diagnosis not present

## 2023-12-02 LAB — CBC
HCT: 42.5 % (ref 39.0–52.0)
Hemoglobin: 13.7 g/dL (ref 13.0–17.0)
MCH: 31.3 pg (ref 26.0–34.0)
MCHC: 32.2 g/dL (ref 30.0–36.0)
MCV: 97 fL (ref 80.0–100.0)
Platelets: 155 K/uL (ref 150–400)
RBC: 4.38 MIL/uL (ref 4.22–5.81)
RDW: 13 % (ref 11.5–15.5)
WBC: 12.2 K/uL — ABNORMAL HIGH (ref 4.0–10.5)
nRBC: 0 % (ref 0.0–0.2)

## 2023-12-02 LAB — BASIC METABOLIC PANEL WITH GFR
Anion gap: 9 (ref 5–15)
BUN: 25 mg/dL — ABNORMAL HIGH (ref 8–23)
CO2: 23 mmol/L (ref 22–32)
Calcium: 8.9 mg/dL (ref 8.9–10.3)
Chloride: 107 mmol/L (ref 98–111)
Creatinine, Ser: 1.12 mg/dL (ref 0.61–1.24)
GFR, Estimated: >60 mL/min (ref >60)
Glucose, Bld: 139 mg/dL — ABNORMAL HIGH (ref 70–99)
Potassium: 5.1 mmol/L (ref 3.5–5.1)
Sodium: 139 mmol/L (ref 135–145)

## 2023-12-02 MED ORDER — ASPIRIN 81 MG PO CHEW: 81.0000 mg | CHEWABLE_TABLET | Freq: Two times a day (BID) | ORAL | 0 refills | Status: AC

## 2023-12-02 MED ORDER — SENNA 8.6 MG PO TABS: 2.0000 | ORAL_TABLET | Freq: Every day | ORAL | 0 refills | Status: AC

## 2023-12-02 MED ORDER — DOCUSATE SODIUM 100 MG PO CAPS: 100.0000 mg | ORAL_CAPSULE | Freq: Two times a day (BID) | ORAL | 0 refills | Status: AC

## 2023-12-02 MED ORDER — ONDANSETRON HCL 4 MG PO TABS: 4.0000 mg | ORAL_TABLET | Freq: Three times a day (TID) | ORAL | 0 refills | Status: AC | PRN

## 2023-12-02 MED ORDER — OXYCODONE HCL 5 MG PO TABS: 5.0000 mg | ORAL_TABLET | ORAL | 0 refills | Status: AC | PRN

## 2023-12-02 MED ORDER — POLYETHYLENE GLYCOL 3350 17 GM/SCOOP PO POWD: 17.0000 g | Freq: Every day | ORAL | 0 refills | Status: AC | PRN

## 2023-12-02 NOTE — Progress Notes (Signed)
   Subjective: 1 Day Post-Op Procedure(s) (LRB): LEFT TOTAL KNEE ARTHROPLASTY USING IMAGELESS COMPUTER-ASSISTED NAVIGATION (Left) Patient reports pain as mild.   Patient seen in rounds for Dr. Fidel. Patient is doing extremely well this morning, reports minimal pain in the knee. We did discuss likelihood that spinal block is still helping with pain control.  We will begin therapy today.  Objective: Vital signs in last 24 hours: Temp:  [97.3 F (36.3 C)-98.1 F (36.7 C)] 97.6 F (36.4 C) (11/27 0629) Pulse Rate:  [31-73] 73 (11/27 0629) Resp:  [15-27] 16 (11/27 0629) BP: (94-176)/(65-105) 148/88 (11/27 0629) SpO2:  [92 %-99 %] 94 % (11/27 0629) Weight:  [101.2 kg-105.6 kg] 105.6 kg (11/27 0002)  Intake/Output from previous day:  Intake/Output Summary (Last 24 hours) at 12/02/2023 0750 Last data filed at 12/02/2023 0533 Gross per 24 hour  Intake 2190.53 ml  Output 1150 ml  Net 1040.53 ml     Intake/Output this shift: No intake/output data recorded.  Labs: Recent Labs    12/02/23 0403  HGB 13.7   Recent Labs    12/02/23 0403  WBC 12.2*  RBC 4.38  HCT 42.5  PLT 155   Recent Labs    12/02/23 0624  NA 139  K 5.1  CL 107  CO2 23  BUN 25*  CREATININE 1.12  GLUCOSE 139*  CALCIUM  8.9   No results for input(s): LABPT, INR in the last 72 hours.  Exam: General - Patient is Alert and Oriented Extremity - Neurologically intact Neurovascular intact Sensation intact distally Dorsiflexion/Plantar flexion intact Dressing - dressing C/D/I Motor Function - intact, moving foot and toes well on exam.   Past Medical History:  Diagnosis Date   Allergy    Arthritis    Cancer (HCC)    skin   Elevated lipids    History of kidney stones    X 2 PASSED ON OWN   Hyperlipidemia    Hypertension    Lumbar pars defect    Pulmonary nodules    Thoracic aortic aneurysm without rupture    STABLE 4.1 CM STABLE PER DR BATRLE 05-26-17 EPIC NOTE     Assessment/Plan: 1 Day Post-Op Procedure(s) (LRB): LEFT TOTAL KNEE ARTHROPLASTY USING IMAGELESS COMPUTER-ASSISTED NAVIGATION (Left) Principal Problem:   Osteoarthritis of left knee Active Problems:   S/P total knee arthroplasty, left  Estimated body mass index is 34.38 kg/m as calculated from the following:   Height as of this encounter: 5' 9 (1.753 m).   Weight as of this encounter: 105.6 kg. Advance diet Up with therapy D/C IV fluids  DVT Prophylaxis - Aspirin  Weight bearing as tolerated. Begin therapy.  Plan is to go Home after hospital stay. Plan for discharge today if meeting goals with therapy Scheduled for OPPT at [facility]). Follow-up in the office in 2 weeks  Roxie Mess, PA-C Orthopedic Surgery [phone] 12/02/2023, 7:50 AM

## 2023-12-02 NOTE — Evaluation (Signed)
 Physical Therapy Evaluation Patient Details Name: Shane Robinson MRN: [id number] DOB: [birth year] Today's Date: 12/02/2023  History of Present Illness  Pt s/p L TKR and with hx of R TKR, R RCR, and L TSR  Clinical Impression  Pt s/p L TKR and presents with decreased L LE strength/ROM and post op pain limiting functional mobility.  Pt should progress to dc home with family assist and reports OP PT scheduled for next week (unsure of date).        If plan is discharge home, recommend the following: A little help with walking and/or transfers;A little help with bathing/dressing/bathroom;Assistance with cooking/housework;Assist for transportation;Help with stairs or ramp for entrance   Can travel by private vehicle        Equipment Recommendations None recommended by PT  Recommendations for Other Services       Functional Status Assessment Patient has had a recent decline in their functional status and demonstrates the ability to make significant improvements in function in a reasonable and predictable amount of time.     Precautions / Restrictions Precautions Precautions: Knee;Fall Restrictions Weight Bearing Restrictions Per Provider Order: Yes LLE Weight Bearing Per Provider Order: Weight bearing as tolerated      Mobility  Bed Mobility Overal bed mobility: Needs Assistance Bed Mobility: Supine to Sit     Supine to sit: Contact guard     General bed mobility comments: cues for sequence    Transfers Overall transfer level: Needs assistance Equipment used: Rolling walker (2 wheels) Transfers: Sit to/from Stand Sit to Stand: Contact guard assist           General transfer comment: cues for LE management and use of UEs to self assist    Ambulation/Gait Ambulation/Gait assistance: Min assist, Contact guard assist Gait Distance (Feet): 100 Feet Assistive device: Rolling walker (2 wheels) Gait Pattern/deviations: Step-to pattern, Step-through pattern, Decreased  step length - right, Decreased step length - left, Shuffle, Trunk flexed Gait velocity: decr     General Gait Details: cues for posture, position from RW and initial sequence  Stairs            Wheelchair Mobility     Tilt Bed    Modified Rankin (Stroke Patients Only)       Balance Overall balance assessment: Needs assistance Sitting-balance support: No upper extremity supported, Feet supported Sitting balance-Leahy Scale: Good     Standing balance support: Bilateral upper extremity supported Standing balance-Leahy Scale: Poor                               Pertinent Vitals/Pain Pain Assessment Pain Assessment: 0-10 Pain Score: 2  Pain Descriptors / Indicators: Tightness Pain Intervention(s): Limited activity within patient's tolerance, Monitored during session, Ice applied    Home Living Family/patient expects to be discharged to:: Private residence Living Arrangements: Spouse/significant other Available Help at Discharge: Family Type of Home: House Home Access: Stairs to enter Entrance Stairs-Rails: None Entrance Stairs-Number of Steps: 2+1   Home Layout: Able to live on main level with bedroom/bathroom Home Equipment: Agricultural Consultant (2 wheels);Cane - single point;BSC/3in1      Prior Function Prior Level of Function : Independent/Modified Independent             Mobility Comments: [employer]       Extremity/Trunk Assessment   Upper Extremity Assessment Upper Extremity Assessment: Overall WFL for tasks assessed    Lower Extremity Assessment Lower Extremity  Assessment: LLE deficits/detail LLE Deficits / Details: 3/5 quads with IND SLR;  AAROM at knee -5 - 70    Cervical / Trunk Assessment Cervical / Trunk Assessment: Normal  Communication   Communication Communication: No apparent difficulties    Cognition Arousal: Alert Behavior During Therapy: WFL for tasks assessed/performed   PT - Cognitive impairments: No apparent  impairments                         Following commands: Intact       Cueing Cueing Techniques: Verbal cues     General Comments      Exercises Total Joint Exercises Ankle Circles/Pumps: AROM, Both, 15 reps, Supine Quad Sets: AROM, Both, 10 reps, Supine Heel Slides: AAROM, Left, 15 reps, Supine Hip ABduction/ADduction: AAROM, Left, 10 reps, Supine Straight Leg Raises: AAROM, AROM, Left, 15 reps, Supine Long Arc Quad: AAROM, Left, 10 reps, Supine   Assessment/Plan    PT Assessment Patient needs continued PT services  PT Problem List Decreased strength;Decreased range of motion;Decreased activity tolerance;Decreased balance;Decreased mobility;Decreased knowledge of use of DME;Pain       PT Treatment Interventions DME instruction;Gait training;Stair training;Functional mobility training;Therapeutic activities;Therapeutic exercise;Patient/family education;Balance training    PT Goals (Current goals can be found in the Care Plan section)  Acute Rehab PT Goals Patient Stated Goal: Regain IND PT Goal Formulation: With patient Time For Goal Achievement: 12/02/23 Potential to Achieve Goals: Good    Frequency 7X/week     Co-evaluation               AM-PAC PT 6 Clicks Mobility  Outcome Measure Help needed turning from your back to your side while in a flat bed without using bedrails?: A Little Help needed moving from lying on your back to sitting on the side of a flat bed without using bedrails?: A Little Help needed moving to and from a bed to a chair (including a wheelchair)?: A Little Help needed standing up from a chair using your arms (e.g., wheelchair or bedside chair)?: A Little Help needed to walk in hospital room?: A Little Help needed climbing 3-5 steps with a railing? : A Little 6 Click Score: 18    End of Session Equipment Utilized During Treatment: Gait belt Activity Tolerance: Patient tolerated treatment well Patient left: in chair;with call  bell/phone within reach;with chair alarm set Nurse Communication: Mobility status PT Visit Diagnosis: Difficulty in walking, not elsewhere classified (R26.2)    Time: 9054-8985 PT Time Calculation (min) (ACUTE ONLY): 29 min   Charges:   PT Evaluation $PT Eval Low Complexity: 1 Low PT Treatments $Therapeutic Exercise: 8-22 mins PT General Charges $$ ACUTE PT VISIT: 1 Visit         [facility] PT Acute Rehabilitation Services Office [phone]   Shima Compere 12/02/2023, 12:23 PM

## 2023-12-02 NOTE — Progress Notes (Signed)
 Physical Therapy Treatment Patient Details Name: Shane Robinson MRN: [id number] DOB: Oct 04, 1950 Today's Date: 12/02/2023   History of Present Illness Pt s/p L TKR and with hx of R TKR, R RCR, and L TSR    PT Comments  Pt continues motivated and progressing well with mobility.  Pt up to ambulate in hall, negotiated stairs and reviewed written HEP.  Pt eager for dc home this date.    If plan is discharge home, recommend the following: A little help with walking and/or transfers;A little help with bathing/dressing/bathroom;Assistance with cooking/housework;Assist for transportation;Help with stairs or ramp for entrance   Can travel by private vehicle        Equipment Recommendations  None recommended by PT    Recommendations for Other Services       Precautions / Restrictions Precautions Precautions: Knee;Fall Restrictions Weight Bearing Restrictions Per Provider Order: Yes LLE Weight Bearing Per Provider Order: Weight bearing as tolerated     Mobility  Bed Mobility Overal bed mobility: Needs Assistance Bed Mobility: Supine to Sit     Supine to sit: Contact guard     General bed mobility comments: Pt up in chair and requests back to same    Transfers Overall transfer level: Needs assistance Equipment used: Rolling walker (2 wheels) Transfers: Sit to/from Stand Sit to Stand: Supervision           General transfer comment: min cues for LE management and use of UEs to self assist    Ambulation/Gait Ambulation/Gait assistance: Contact guard assist, Supervision Gait Distance (Feet): 100 Feet Assistive device: Rolling walker (2 wheels) Gait Pattern/deviations: Step-to pattern, Step-through pattern, Decreased step length - right, Decreased step length - left, Shuffle, Trunk flexed Gait velocity: decr     General Gait Details: cues for posture, position from RW and initial sequence   Stairs Stairs: Yes Stairs assistance: Min assist Stair Management: No rails,  Step to pattern, Backwards, Forwards, With walker Number of Stairs: 6 General stair comments: single step twice fwd, 2 step twice bkwd; cues for sequence   Wheelchair Mobility     Tilt Bed    Modified Rankin (Stroke Patients Only)       Balance Overall balance assessment: Needs assistance Sitting-balance support: No upper extremity supported, Feet supported Sitting balance-Leahy Scale: Good     Standing balance support: No upper extremity supported Standing balance-Leahy Scale: Fair                              Hotel Manager: No apparent difficulties  Cognition Arousal: Alert Behavior During Therapy: WFL for tasks assessed/performed   PT - Cognitive impairments: No apparent impairments                         Following commands: Intact      Cueing Cueing Techniques: Verbal cues  Exercises Total Joint Exercises Ankle Circles/Pumps: AROM, Both, 15 reps, Supine Quad Sets: AROM, Both, 10 reps, Supine Heel Slides: AAROM, Left, 15 reps, Supine Hip ABduction/ADduction: AAROM, Left, 10 reps, Supine Straight Leg Raises: AAROM, AROM, Left, 15 reps, Supine Long Arc Quad: AAROM, Left, 10 reps, Supine    General Comments        Pertinent Vitals/Pain Pain Assessment Pain Assessment: 0-10 Pain Score: 2  Pain Descriptors / Indicators: Tightness Pain Intervention(s): Limited activity within patient's tolerance, Monitored during session, Ice applied    Home Living Family/patient expects to be  discharged to:: Private residence Living Arrangements: Spouse/significant other Available Help at Discharge: Family Type of Home: House Home Access: Stairs to enter Entrance Stairs-Rails: None Entrance Stairs-Number of Steps: 2+1   Home Layout: Able to live on main level with bedroom/bathroom Home Equipment: Agricultural Consultant (2 wheels);Cane - single point;BSC/3in1      Prior Function            PT Goals (current goals can  now be found in the care plan section) Acute Rehab PT Goals Patient Stated Goal: Regain IND PT Goal Formulation: With patient Time For Goal Achievement: 12/02/23 Potential to Achieve Goals: Good Progress towards PT goals: Progressing toward goals    Frequency    7X/week      PT Plan      Co-evaluation              AM-PAC PT 6 Clicks Mobility   Outcome Measure  Help needed turning from your back to your side while in a flat bed without using bedrails?: A Little Help needed moving from lying on your back to sitting on the side of a flat bed without using bedrails?: A Little Help needed moving to and from a bed to a chair (including a wheelchair)?: A Little Help needed standing up from a chair using your arms (e.g., wheelchair or bedside chair)?: A Little Help needed to walk in hospital room?: A Little Help needed climbing 3-5 steps with a railing? : A Little 6 Click Score: 18    End of Session Equipment Utilized During Treatment: Gait belt Activity Tolerance: Patient tolerated treatment well Patient left: in chair;with call bell/phone within reach;with chair alarm set Nurse Communication: Mobility status PT Visit Diagnosis: Difficulty in walking, not elsewhere classified (R26.2)     Time: 1049-1106 PT Time Calculation (min) (ACUTE ONLY): 17 min  Charges:    $Gait Training: 8-22 mins $Therapeutic Exercise: 8-22 mins PT General Charges $$ ACUTE PT VISIT: 1 Visit                     [facility] PT Acute Rehabilitation Services Office [phone]    Omesha Bowerman 12/02/2023, 12:29 PM

## 2023-12-03 ENCOUNTER — Other Ambulatory Visit (HOSPITAL_COMMUNITY): Payer: Self-pay

## 2023-12-03 ENCOUNTER — Encounter (HOSPITAL_COMMUNITY): Payer: Self-pay | Admitting: Orthopedic Surgery

## 2023-12-06 DIAGNOSIS — M25661 Stiffness of right knee, not elsewhere classified: Secondary | ICD-10-CM | POA: Diagnosis not present

## 2023-12-06 DIAGNOSIS — M25561 Pain in right knee: Secondary | ICD-10-CM | POA: Diagnosis not present

## 2023-12-07 NOTE — Addendum Note (Signed)
 Addendum  created 12/07/23 1102 by Erma Thom SAUNDERS, MD   Attestation recorded in Intraprocedure, Intraprocedure Attestations filed

## 2023-12-08 DIAGNOSIS — M25561 Pain in right knee: Secondary | ICD-10-CM | POA: Diagnosis not present

## 2023-12-08 DIAGNOSIS — M25661 Stiffness of right knee, not elsewhere classified: Secondary | ICD-10-CM | POA: Diagnosis not present

## 2023-12-10 DIAGNOSIS — M25561 Pain in right knee: Secondary | ICD-10-CM | POA: Diagnosis not present

## 2023-12-10 DIAGNOSIS — M25661 Stiffness of right knee, not elsewhere classified: Secondary | ICD-10-CM | POA: Diagnosis not present

## 2023-12-13 ENCOUNTER — Other Ambulatory Visit: Payer: Self-pay | Admitting: Family Medicine

## 2023-12-13 DIAGNOSIS — M25662 Stiffness of left knee, not elsewhere classified: Secondary | ICD-10-CM | POA: Diagnosis not present

## 2023-12-13 DIAGNOSIS — M25562 Pain in left knee: Secondary | ICD-10-CM | POA: Diagnosis not present

## 2023-12-13 DIAGNOSIS — E7800 Pure hypercholesterolemia, unspecified: Secondary | ICD-10-CM

## 2023-12-13 NOTE — Telephone Encounter (Unsigned)
 Copied from CRM [id number]. Topic: Clinical - Medication Refill >> Dec 13, 2023  8:39 AM Macario HERO wrote: Medication: rosuvastatin  (CRESTOR ) 20 MG tablet [[id number]]  Has the patient contacted their pharmacy? No (Agent: If no, request that the patient contact the pharmacy for the refill. If patient does not wish to contact the pharmacy document the reason why and proceed with request.) (Agent: If yes, when and what did the pharmacy advise?)  This is the patient's preferred pharmacy:  [facility] [address], [place] - [address] [place] [address] Phone: [phone] Fax: [phone]   Is this the correct pharmacy for this prescription? Yes If no, delete pharmacy and type the correct one.   Has the prescription been filled recently? Yes  Is the patient out of the medication? Yes  Has the patient been seen for an appointment in the last year OR does the patient have an upcoming appointment? Yes  Can we respond through MyChart? Yes  Agent: Please be advised that Rx refills may take up to 3 business days. We ask that you follow-up with your pharmacy.

## 2023-12-14 NOTE — Telephone Encounter (Signed)
 Requested medications are due for refill today.  yes  Requested medications are on the active medications list.  yes  Last refill. 08/30/2024 #90 0 rf  Future visit scheduled.   no  Notes to clinic.  Labs are expired.    Requested Prescriptions  Pending Prescriptions Disp Refills   rosuvastatin  (CRESTOR ) 20 MG tablet 90 tablet 0    Sig: TAKE 1 TABLET BY MOUTH ONCE DAILY . APPOINTMENT REQUIRED FOR FUTURE REFILLS     Cardiovascular:  Antilipid - Statins 2 Failed - 12/14/2023  5:14 PM      Failed - Lipid Panel in normal range within the last 12 months    Cholesterol  Date Value Ref Range Status  06/18/2022 106 <200 mg/dL Final   LDL Cholesterol (Calc)  Date Value Ref Range Status  06/18/2022 51 mg/dL (calc) Final    Comment:    Reference range: <100 . Desirable range <100 mg/dL for primary prevention;   <70 mg/dL for patients with CHD or diabetic patients  with > or = 2 CHD risk factors. SABRA LDL-C is now calculated using the Martin-Hopkins  calculation, which is a validated novel method providing  better accuracy than the Friedewald equation in the  estimation of LDL-C.  Gladis APPLETHWAITE et al. SANDREA. 7986;689(80): 2061-2068  (http://education.[employer].com/faq/FAQ164)    HDL  Date Value Ref Range Status  06/18/2022 38 (L) > OR = 40 mg/dL Final   Triglycerides  Date Value Ref Range Status  06/18/2022 90 <150 mg/dL Final         Passed - Cr in normal range and within 360 days    Creat  Date Value Ref Range Status  07/27/2023 1.10 0.70 - 1.28 mg/dL Final   Creatinine, Ser  Date Value Ref Range Status  12/02/2023 1.12 0.61 - 1.24 mg/dL Final         Passed - Patient is not pregnant      Passed - Valid encounter within last 12 months    Recent Outpatient Visits           4 months ago Diarrhea of infectious origin   [facility] Duanne Butler DASEN, MD   4 months ago Diarrhea of infectious origin   [facility] Pickard, Butler DASEN, MD   10 months ago Irregular cardiac rhythm   [facility] Pickard, Butler DASEN, MD   1 year ago Essential hypertension    [facility] Pickard, Butler DASEN, MD

## 2023-12-15 ENCOUNTER — Other Ambulatory Visit: Payer: Self-pay | Admitting: Family Medicine

## 2023-12-15 DIAGNOSIS — M6281 Muscle weakness (generalized): Secondary | ICD-10-CM | POA: Insufficient documentation

## 2023-12-15 DIAGNOSIS — E7800 Pure hypercholesterolemia, unspecified: Secondary | ICD-10-CM

## 2023-12-16 MED ORDER — ROSUVASTATIN CALCIUM 20 MG PO TABS: ORAL_TABLET | ORAL | 0 refills | Status: DC

## 2023-12-24 ENCOUNTER — Encounter (HOSPITAL_COMMUNITY): Payer: Self-pay

## 2023-12-24 ENCOUNTER — Emergency Department (HOSPITAL_COMMUNITY): Admission: EM | Admit: 2023-12-24 | Discharge: 2023-12-24 | Disposition: A | Discharge status: Home / Self Care

## 2023-12-24 ENCOUNTER — Emergency Department (HOSPITAL_COMMUNITY)

## 2023-12-24 DIAGNOSIS — Z91040 Latex allergy status: Secondary | ICD-10-CM | POA: Insufficient documentation

## 2023-12-24 DIAGNOSIS — R1013 Epigastric pain: Secondary | ICD-10-CM | POA: Insufficient documentation

## 2023-12-24 DIAGNOSIS — Z7982 Long term (current) use of aspirin: Secondary | ICD-10-CM | POA: Insufficient documentation

## 2023-12-24 DIAGNOSIS — R0789 Other chest pain: Secondary | ICD-10-CM | POA: Insufficient documentation

## 2023-12-24 LAB — CBC
HCT: 40.3 % (ref 39.0–52.0)
Hemoglobin: 12.5 g/dL — ABNORMAL LOW (ref 13.0–17.0)
MCH: 30.3 pg (ref 26.0–34.0)
MCHC: 31 g/dL (ref 30.0–36.0)
MCV: 97.8 fL (ref 80.0–100.0)
Platelets: 312 K/uL (ref 150–400)
RBC: 4.12 MIL/uL — ABNORMAL LOW (ref 4.22–5.81)
RDW: 12.9 % (ref 11.5–15.5)
WBC: 9.3 K/uL (ref 4.0–10.5)
nRBC: 0 % (ref 0.0–0.2)

## 2023-12-24 LAB — COMPREHENSIVE METABOLIC PANEL WITH GFR
ALT: 14 U/L (ref 0–44)
AST: 23 U/L (ref 15–41)
Albumin: 4.1 g/dL (ref 3.5–5.0)
Alkaline Phosphatase: 71 U/L (ref 38–126)
Anion gap: 11 (ref 5–15)
BUN: 20 mg/dL (ref 8–23)
CO2: 23 mmol/L (ref 22–32)
Calcium: 9.4 mg/dL (ref 8.9–10.3)
Chloride: 104 mmol/L (ref 98–111)
Creatinine, Ser: 0.9 mg/dL (ref 0.61–1.24)
GFR, Estimated: >60 mL/min
Glucose, Bld: 100 mg/dL — ABNORMAL HIGH (ref 70–99)
Potassium: 4.2 mmol/L (ref 3.5–5.1)
Sodium: 138 mmol/L (ref 135–145)
Total Bilirubin: 0.6 mg/dL (ref 0.0–1.2)
Total Protein: 7.5 g/dL (ref 6.5–8.1)

## 2023-12-24 LAB — LIPASE, BLOOD: Lipase: 61 U/L — ABNORMAL HIGH (ref 11–51)

## 2023-12-24 LAB — TROPONIN T, HIGH SENSITIVITY
Troponin T High Sensitivity: 20 ng/L — ABNORMAL HIGH (ref 0–19)
Troponin T High Sensitivity: 23 ng/L — ABNORMAL HIGH (ref 0–19)

## 2023-12-24 MED ORDER — IOHEXOL 350 MG/ML SOLN
75.0000 mL | Freq: Once | INTRAVENOUS | Status: AC | PRN
  Administered 2023-12-24: 75 mL via INTRAVENOUS

## 2023-12-24 MED ORDER — ALUM & MAG HYDROXIDE-SIMETH 200-200-20 MG/5ML PO SUSP
30.0000 mL | Freq: Once | ORAL | Status: AC
  Administered 2023-12-24: 30 mL via ORAL
  Filled 2023-12-24: qty 30

## 2023-12-24 MED ORDER — FAMOTIDINE 20 MG PO TABS: 20.0000 mg | ORAL_TABLET | Freq: Two times a day (BID) | ORAL | 0 refills | Status: DC

## 2023-12-24 MED ORDER — HYDROXYZINE HCL 25 MG PO TABS: 25.0000 mg | ORAL_TABLET | Freq: Four times a day (QID) | ORAL | 0 refills | Status: AC | PRN

## 2023-12-24 NOTE — Discharge Instructions (Addendum)
 Your workup today was reassuring.  Please try the Pepcid  to see if this helps with your symptoms.  You may also try the hydroxyzine  to see if this helps with sleeping.  Be careful with this as this can cause confusion or drowsiness.  Please follow-up with your doctor and return to the ER for worsening symptoms.  I did place a referral to cardiology so they should call you in the next few days to get you in for follow-up.

## 2023-12-24 NOTE — ED Triage Notes (Signed)
 Pt BIB [facility], pt was on his way to PT, chest was bothering him so he stopped [place] fire station. Reports chest pressure, non radiating, recent knee replacement 11/27. Given 324 aspirin , PTA with complete relief of chest pain.   18 LAC  153/90 HR 80 96% room air

## 2023-12-24 NOTE — ED Provider Notes (Signed)
 5:16 PM Assumed care of patient from off-going team. For more details, please see note from same day.  In brief, this is a 73 y.o. male w/ chest pain  Plan/Dispo at time of sign-out & ED Course since sign-out: [ ]  repeat troponin  BP (!) 140/99   Pulse 72   Temp 97.8 F (36.6 C) (Oral)   Resp 20   SpO2 100%    ED Course:   Clinical Course as of 12/24/23 1716  Fri Dec 24, 2023  1716 Troponin T High Sensitivity(!): 23 stable [HN]    Clinical Course User Index [HN] Franklyn Sid SAILOR, MD   Patient's CTs did show enlarged prostate and R kidney cyst, which I informed them of. Also informed them of hiatal hernia, which patient stated he was aware of. Dr. Ula had prescribed pepcid which I think will help. Also discussed his discomfort at night, not sleeping well d/t pain in L knee from recent surgery. Had recently been prescribed hydrocodone . Confirmed no additional acetaminophen  while taking hydrocodone . Advised atarax PRN for anxiety. Cardiology referral placed by Dr. Ula. Considered admission but patient is well-appearing and HDS. Advised f/u with PCP and orthopedics.   Dispo: DC w/ discharge instructions/return precautions. All questions answered to patient's satisfaction.   ------------------------------- Sid Franklyn, MD Emergency Medicine  This note was created using dictation software, which may contain spelling or grammatical errors.   Franklyn Sid SAILOR, MD 12/24/23 [phone]

## 2023-12-24 NOTE — ED Notes (Signed)
 Pt ambulated to bathroom with assistance of a cain

## 2023-12-24 NOTE — ED Provider Notes (Signed)
 " [facility] Provider Note   CSN: [id number] Arrival date & time: 12/24/23  1020     Patient presents with: No chief complaint on file.   Shane Robinson is a 73 y.o. male.   73 YOM complains of epigastric pain/pressure.  States has been intermittent now over the past few days.  States that he did have knee replacement surgery recently.  Denies any fevers or chills.  Denies any chest pain.  Does state that it feels like it is somewhat difficult to get a deep breath and this is painful short of breath.  Denies any hemoptysis or leg swelling.        Prior to Admission medications  Medication Sig Start Date End Date Taking? Authorizing Provider  famotidine  (PEPCID ) 20 MG tablet Take 1 tablet (20 mg total) by mouth 2 (two) times daily. 12/24/23  Yes [name], MD  hydrOXYzine  (ATARAX ) 25 MG tablet Take 1 tablet (25 mg total) by mouth every 6 (six) hours as needed for anxiety. 12/24/23  Yes [name], MD  aspirin  (ASPIRIN  CHILDRENS) 81 MG chewable tablet Chew 1 tablet (81 mg total) by mouth 2 (two) times daily with a meal. 12/02/23 01/16/24  Edmisten, Roxie CROME, PA  benazepril  (LOTENSIN ) 20 MG tablet Take 1 tablet by mouth once daily 10/12/23   Duanne Butler DASEN, MD  docusate sodium  (COLACE) 100 MG capsule Take 1 capsule (100 mg total) by mouth 2 (two) times daily. 12/02/23 01/01/24  Edmisten, Roxie CROME, PA  ondansetron  (ZOFRAN ) 4 MG tablet Take 1 tablet (4 mg total) by mouth every 8 (eight) hours as needed for nausea or vomiting. 12/02/23 12/01/24  Edmisten, Roxie CROME, PA  oxyCODONE  (ROXICODONE ) 5 MG immediate release tablet Take 1 tablet (5 mg total) by mouth every 4 (four) hours as needed for severe pain (pain score 7-10). 12/02/23   Edmisten, Kristie L, PA  polyethylene glycol powder (GLYCOLAX /MIRALAX ) 17 GM/SCOOP powder Take 17 g by mouth daily as needed for mild constipation or moderate constipation. Dissolve 1 capful (17g) in 4-8  ounces of liquid and take by mouth daily. 12/02/23 01/01/24  Edmisten, Kristie L, PA  rosuvastatin  (CRESTOR ) 20 MG tablet TAKE 1 TABLET BY MOUTH ONCE DAILY . APPOINTMENT REQUIRED FOR FUTURE REFILLS 12/16/23   Duanne Butler DASEN, MD    Allergies: Latex    Review of Systems  Respiratory:  Positive for shortness of breath.   All other systems reviewed and are negative.   Updated Vital Signs BP (!) 140/99   Pulse 72   Temp 97.8 F (36.6 C) (Oral)   Resp 20   SpO2 100%   Physical Exam Vitals and nursing note reviewed.   Gen: NAD Eyes: PERRL, EOMI HEENT: no oropharyngeal swelling Neck: trachea midline Resp: clear to auscultation bilaterally Card: RRR, no murmurs, rubs, or gallops Abd: nontender, nondistended Extremities: no calf tenderness, no edema Vascular: 2+ radial pulses bilaterally, 2+ DP pulses bilaterally Skin: no rashes Psyc: acting appropriately   (all labs ordered are listed, but only abnormal results are displayed) Labs Reviewed  CBC - Abnormal; Notable for the following components:      Result Value   RBC 4.12 (*)    Hemoglobin 12.5 (*)    All other components within normal limits  COMPREHENSIVE METABOLIC PANEL WITH GFR - Abnormal; Notable for the following components:   Glucose, Bld 100 (*)    All other components within normal limits  LIPASE, BLOOD - Abnormal;  Notable for the following components:   Lipase 61 (*)    All other components within normal limits  TROPONIN T, HIGH SENSITIVITY - Abnormal; Notable for the following components:   Troponin T High Sensitivity 20 (*)    All other components within normal limits  TROPONIN T, HIGH SENSITIVITY    EKG: None  Radiology: CT Angio Chest PE W and/or Wo Contrast Result Date: 12/24/2023 CLINICAL DATA:  Pulmonary embolism (PE) suspected, high prob; Abdominal pain, acute, nonlocalized. EXAM: CT ANGIOGRAPHY CHEST CT ABDOMEN AND PELVIS WITH CONTRAST TECHNIQUE: Multidetector CT imaging of the chest was  performed using the standard protocol during bolus administration of intravenous contrast. Multiplanar CT image reconstructions and MIPs were obtained to evaluate the vascular anatomy. Multidetector CT imaging of the abdomen and pelvis was performed using the standard protocol during bolus administration of intravenous contrast. RADIATION DOSE REDUCTION: This exam was performed according to the departmental dose-optimization program which includes automated exposure control, adjustment of the mA and/or kV according to patient size and/or use of iterative reconstruction technique. CONTRAST:  75mL OMNIPAQUE  IOHEXOL  350 MG/ML SOLN COMPARISON:  CT Angiography chest from 06/03/2022 and CT angiography chest, abdomen and pelvis from 04/01/2021. FINDINGS: CTA CHEST FINDINGS Cardiovascular: No evidence of embolism to the proximal subsegmental pulmonary artery level. Mild cardiomegaly. No pericardial effusion. No aortic aneurysm. There are coronary artery calcifications, in keeping with coronary artery disease. There are also mild peripheral atherosclerotic vascular calcifications of thoracic aorta and its major branches. Mediastinum/Nodes: Visualized thyroid gland appears grossly unremarkable. No solid / cystic mediastinal masses. The esophagus is nondistended precluding optimal assessment. No axillary, mediastinal or hilar lymphadenopathy by size criteria. Lungs/Pleura: The central tracheo-bronchial tree is patent. There is mild, smooth, circumferential thickening of the segmental and subsegmental bronchial walls, throughout bilateral lungs, which is nonspecific. Findings are most commonly seen with bronchitis or reactive airway disease, such as asthma. There are dependent changes in bilateral lungs. No mass or consolidation. No pleural effusion or pneumothorax. Redemonstration of multiple scattered noncalcified nodules throughout bilateral lungs (marked with electronic arrow sign on series 6) with largest in the right  lower lobe subpleural location measuring up to 5 x 7 mm (series 6, image 98). No new or suspicious lung nodule. Musculoskeletal: The visualized soft tissues of the chest wall are grossly unremarkable. No suspicious osseous lesions. There are mild multilevel degenerative changes in the visualized spine. Left reverse shoulder arthroplasty noted. Review of the MIP images confirms the above findings. CT ABDOMEN and PELVIS FINDINGS Hepatobiliary: The liver is normal in size. Non-cirrhotic configuration. No suspicious mass. No intrahepatic or extrahepatic bile duct dilation. No calcified gallstones. Normal gallbladder wall thickness. No pericholecystic inflammatory changes. Pancreas: Unremarkable. No pancreatic ductal dilatation or surrounding inflammatory changes. Spleen: Within normal limits. No focal lesion. Adrenals/Urinary Tract: Adrenal glands are unremarkable. No suspicious renal mass. There is a simple cyst in the right kidney upper pole, posteriorly measuring 1.6 x 1.7 cm. There also smaller scattered sinus cysts in the bilateral kidneys lower poles. Unremarkable urinary bladder. Stomach/Bowel: There is a small sliding hiatal hernia. No disproportionate dilation of the small or large bowel loops. No evidence of abnormal bowel wall thickening or inflammatory changes. The appendix is unremarkable. Vascular/Lymphatic: No ascites or pneumoperitoneum. No abdominal or pelvic lymphadenopathy, by size criteria. No aneurysmal dilation of the major abdominal arteries. There are moderate peripheral atherosclerotic vascular calcifications of the aorta and its major branches. Reproductive: Enlarged prostate. Symmetric seminal vesicles. Other: There are small fat containing umbilical and right  inguinal hernias. The soft tissues and abdominal wall are otherwise unremarkable. Musculoskeletal: No suspicious osseous lesions. There are moderate - marked multilevel degenerative changes in the visualized spine. Bilateral L5  spondylolysis noted with associated resultant grade 1/2 anterolisthesis of L5 over S1. Review of the MIP images confirms the above findings. IMPRESSION: 1. No embolism to the proximal subsegmental pulmonary artery level. 2. No acute inflammatory process identified within the abdomen or pelvis. 3. Multiple other nonacute observations, as described above. Aortic Atherosclerosis (ICD10-I70.0). Electronically Signed   By: Ree Molt M.D.   On: 12/24/2023 14:46   CT ABDOMEN PELVIS W CONTRAST Result Date: 12/24/2023 CLINICAL DATA:  Pulmonary embolism (PE) suspected, high prob; Abdominal pain, acute, nonlocalized. EXAM: CT ANGIOGRAPHY CHEST CT ABDOMEN AND PELVIS WITH CONTRAST TECHNIQUE: Multidetector CT imaging of the chest was performed using the standard protocol during bolus administration of intravenous contrast. Multiplanar CT image reconstructions and MIPs were obtained to evaluate the vascular anatomy. Multidetector CT imaging of the abdomen and pelvis was performed using the standard protocol during bolus administration of intravenous contrast. RADIATION DOSE REDUCTION: This exam was performed according to the departmental dose-optimization program which includes automated exposure control, adjustment of the mA and/or kV according to patient size and/or use of iterative reconstruction technique. CONTRAST:  75mL OMNIPAQUE  IOHEXOL  350 MG/ML SOLN COMPARISON:  CT Angiography chest from 06/03/2022 and CT angiography chest, abdomen and pelvis from 04/01/2021. FINDINGS: CTA CHEST FINDINGS Cardiovascular: No evidence of embolism to the proximal subsegmental pulmonary artery level. Mild cardiomegaly. No pericardial effusion. No aortic aneurysm. There are coronary artery calcifications, in keeping with coronary artery disease. There are also mild peripheral atherosclerotic vascular calcifications of thoracic aorta and its major branches. Mediastinum/Nodes: Visualized thyroid gland appears grossly unremarkable. No  solid / cystic mediastinal masses. The esophagus is nondistended precluding optimal assessment. No axillary, mediastinal or hilar lymphadenopathy by size criteria. Lungs/Pleura: The central tracheo-bronchial tree is patent. There is mild, smooth, circumferential thickening of the segmental and subsegmental bronchial walls, throughout bilateral lungs, which is nonspecific. Findings are most commonly seen with bronchitis or reactive airway disease, such as asthma. There are dependent changes in bilateral lungs. No mass or consolidation. No pleural effusion or pneumothorax. Redemonstration of multiple scattered noncalcified nodules throughout bilateral lungs (marked with electronic arrow sign on series 6) with largest in the right lower lobe subpleural location measuring up to 5 x 7 mm (series 6, image 98). No new or suspicious lung nodule. Musculoskeletal: The visualized soft tissues of the chest wall are grossly unremarkable. No suspicious osseous lesions. There are mild multilevel degenerative changes in the visualized spine. Left reverse shoulder arthroplasty noted. Review of the MIP images confirms the above findings. CT ABDOMEN and PELVIS FINDINGS Hepatobiliary: The liver is normal in size. Non-cirrhotic configuration. No suspicious mass. No intrahepatic or extrahepatic bile duct dilation. No calcified gallstones. Normal gallbladder wall thickness. No pericholecystic inflammatory changes. Pancreas: Unremarkable. No pancreatic ductal dilatation or surrounding inflammatory changes. Spleen: Within normal limits. No focal lesion. Adrenals/Urinary Tract: Adrenal glands are unremarkable. No suspicious renal mass. There is a simple cyst in the right kidney upper pole, posteriorly measuring 1.6 x 1.7 cm. There also smaller scattered sinus cysts in the bilateral kidneys lower poles. Unremarkable urinary bladder. Stomach/Bowel: There is a small sliding hiatal hernia. No disproportionate dilation of the small or large bowel  loops. No evidence of abnormal bowel wall thickening or inflammatory changes. The appendix is unremarkable. Vascular/Lymphatic: No ascites or pneumoperitoneum. No abdominal or pelvic lymphadenopathy, by size  criteria. No aneurysmal dilation of the major abdominal arteries. There are moderate peripheral atherosclerotic vascular calcifications of the aorta and its major branches. Reproductive: Enlarged prostate. Symmetric seminal vesicles. Other: There are small fat containing umbilical and right inguinal hernias. The soft tissues and abdominal wall are otherwise unremarkable. Musculoskeletal: No suspicious osseous lesions. There are moderate - marked multilevel degenerative changes in the visualized spine. Bilateral L5 spondylolysis noted with associated resultant grade 1/2 anterolisthesis of L5 over S1. Review of the MIP images confirms the above findings. IMPRESSION: 1. No embolism to the proximal subsegmental pulmonary artery level. 2. No acute inflammatory process identified within the abdomen or pelvis. 3. Multiple other nonacute observations, as described above. Aortic Atherosclerosis (ICD10-I70.0). Electronically Signed   By: Ree Molt M.D.   On: 12/24/2023 14:46   DG Chest 1 View Result Date: 12/24/2023 EXAM: 1 VIEW(S) XRAY OF THE CHEST 12/24/2023 11:02:00 AM COMPARISON: None available. CLINICAL HISTORY: SOB (shortness of breath) FINDINGS: LUNGS AND PLEURA: No focal pulmonary opacity. No pleural effusion. No pneumothorax. HEART AND MEDIASTINUM: No acute abnormality of the cardiac and mediastinal silhouettes. BONES AND SOFT TISSUES: Partially visualized left shoulder arthroplasty hardware. Multilevel thoracic osteophytosis. IMPRESSION: 1. No acute process. Electronically signed by: Lonni Necessary MD 12/24/2023 12:52 PM EST RP Workstation: [id number]     Procedures   Medications Ordered in the ED  alum & mag hydroxide-simeth (MAALOX/MYLANTA) 200-200-20 MG/5ML suspension 30 mL (has no  administration in time range)  iohexol  (OMNIPAQUE ) 350 MG/ML injection 75 mL (75 mLs Intravenous Contrast Given 12/24/23 1345)                                    Medical Decision Making 73 year old male with recent hip knee replacement presenting to the emergency department today with epigastric discomfort and some dyspnea.  No further evaluate the patient here with basic labs as well as an EKG, chest x-ray, and troponin to eval for ACS, pulmonary edema, pulmonary infiltrates, pneumothorax.  If his initial workup is unrevealing we will consider CT angiogram as he is postop.  Will reevaluate for ultimate disposition.  The patient initial troponin is mildly elevated which is nonspecific.  Chest x-ray was clear.  CT angiogram is ordered as well as CT scan of his abdomen and as his lipase was mildly elevated.  The patient does not have a pulmonary embolism.  CT scan of his abdomen does not show any acute abnormalities.  Plan is for reevaluation after second troponin.  I think that if this is stable or downtrending that he could potentially be discharged follow-up with cardiology as an outpatient.  Amount and/or Complexity of Data Reviewed Labs: ordered. Radiology: ordered.  Risk OTC drugs. Prescription drug management.        Final diagnoses:  Atypical chest pain    ED Discharge Orders          Ordered    hydrOXYzine  (ATARAX ) 25 MG tablet  Every 6 hours PRN        12/24/23 1542    famotidine  (PEPCID ) 20 MG tablet  2 times daily        12/24/23 1542    Ambulatory referral to Cardiology       Comments: If you have not heard from the Cardiology office within the next 72 hours please call [phone].   12/24/23 1543               Ula Barter  R, MD 12/24/23 1543  "

## 2023-12-24 NOTE — ED Provider Triage Note (Signed)
 Emergency Medicine Provider Triage Evaluation Note  Shane Robinson , a 73 y.o. male  was evaluated in triage.  Pt complains of epigastric pain/pressure.  States has been intermittent now over the past few days.  States that he did have knee replacement surgery recently.  Denies any fevers or chills.  Denies any chest pain.  Does state that it feels like it is somewhat difficult to get a deep breath.  Denies any hemoptysis or leg swelling.  Review of Systems  Positive: See above Negative: Chest pain  Physical Exam  BP (!) 151/89 (BP Location: Right Arm)   Pulse 75   Temp 98.4 F (36.9 C)   Resp 17   SpO2 96%  Gen:   Awake, no distress   Resp:  Normal effort  MSK:   Moves extremities without difficulty  Other:  Patient reports mild epigastric tenderness to palpation, no guarding or rebound  Medical Decision Making  Medically screening exam initiated at 10:33 AM.  Appropriate orders placed.  Shane Robinson was informed that the remainder of the evaluation will be completed by another provider, this initial triage assessment does not replace that evaluation, and the importance of remaining in the ED until their evaluation is complete.     Shane Prentice SAUNDERS, MD 12/24/23 1034

## 2024-01-10 ENCOUNTER — Ambulatory Visit: Admitting: Family Medicine

## 2024-01-10 ENCOUNTER — Encounter: Payer: Self-pay | Admitting: Family Medicine

## 2024-01-10 VITALS — BP 120/64 | HR 65 | Temp 97.9°F | Ht 69.0 in | Wt 221.0 lb

## 2024-01-10 DIAGNOSIS — E7800 Pure hypercholesterolemia, unspecified: Secondary | ICD-10-CM

## 2024-01-10 DIAGNOSIS — R0609 Other forms of dyspnea: Secondary | ICD-10-CM | POA: Diagnosis not present

## 2024-01-10 DIAGNOSIS — K449 Diaphragmatic hernia without obstruction or gangrene: Secondary | ICD-10-CM

## 2024-01-10 DIAGNOSIS — R195 Other fecal abnormalities: Secondary | ICD-10-CM

## 2024-01-10 MED ORDER — PANTOPRAZOLE SODIUM 40 MG PO TBEC: 40.0000 mg | DELAYED_RELEASE_TABLET | Freq: Every day | ORAL | 3 refills | Status: AC

## 2024-01-10 MED ORDER — CHOLESTYRAMINE 4 G PO PACK: 4.0000 g | PACK | Freq: Two times a day (BID) | ORAL | 12 refills | Status: AC

## 2024-01-10 MED ORDER — ROSUVASTATIN CALCIUM 20 MG PO TABS: ORAL_TABLET | ORAL | 3 refills | Status: AC

## 2024-01-10 NOTE — Progress Notes (Signed)
 "  Subjective:    Patient ID: Shane Robinson, male    DOB: 1950-07-28, 74 y.o.   MRN: [id number] 07/22/23 Patient is a very pleasant 75 year old Caucasian gentleman.  He works as a engineer, maintenance.  He is constantly around cows, cow manure, pastures, etc. he has had watery diarrhea for 4 weeks straight.  Its 3-4 times a day.  He states that as soon as he eats, he will have profuse watery diarrhea with cramping abdominal pain.  He states that he cannot trust the fart.  He reports occasional accidents.  He also has crampy abdominal pain but denies fever or blood in his stool.  He denies any travel.  At that time, my plan was: I suspect Salmonella or Shigella due to occupational exposure.  Patient has a history of a joint replacement with hardware.  I am concerned about invasive disease.  Therefore recommended Cipro  500 mg twice daily for 7 days.  Meanwhile check stool cultures, stool ova and parasite, and C. difficile toxin studies to rule out other potential causes of infectious diarrhea  07/27/23 Stool cultures were negative for Salmonella, Shigella, Campylobacter.  C. difficile toxins were negative.  Patient is slightly better after taking Cipro .  He continues to have 2-3 watery bowel movements today with excessive gas.  He states that over the last 24 hours he is no longer feeling nauseated.  He denies fever or chills.  Denies any melena or hematochezia.  He does state that over the last few months he has had significant wasting of his muscles.  He states that he is lost tremendous strength in his arms and shoulders and chest muscles.  He feels like his muscles are getting weaker.  He denies any muscle pain.  01/10/24 Patient reports that he has been experiencing dyspnea on exertion and increasing shortness of breath with activity.  He went to the emergency room recently due to chest pain and chest pressure where he was diagnosed with a hiatal hernia.  They did perform a CT scan which ruled out pulmonary  embolism.  CT scan does make mention of a small sliding hiatal hernia.  The patient was started on Pepcid  and he states that he has been able to sleep every night and that the shortness of breath when lying down has improved since he has been on Pepcid .  He states that prior to the Pepcid  he was having shortness of breath when supine every night.  He believes that the hiatal hernia may be causing his shortness of breath with activity as well.  He has not had a stress test or an echocardiogram in several years.  He denies any angina today.  His primary reason for seeing me however with his loose stool.  He states that he continues to have loose stool that he describes like soft serve ice cream.  He also reports a lot of lower abdominal pressure and flatus and gas.  He tried fiber with minimal benefit.  He denies any diarrhea or melena or hematochezia.  I reviewed his lab work from the hospital Past Medical History:  Diagnosis Date   Allergy    Arthritis    Cancer (HCC)    skin   Elevated lipids    History of kidney stones    X 2 PASSED ON OWN   Hyperlipidemia    Hypertension    Lumbar pars defect    Pulmonary nodules    Thoracic aortic aneurysm without rupture    STABLE 4.1 CM  STABLE PER DR CONNI 05-26-17 EPIC NOTE   Past Surgical History:  Procedure Laterality Date   COLONOSCOPY     HERNIA REPAIR  1975   INGUINAL   KNEE ARTHROPLASTY Left 12/01/2023   Procedure: LEFT TOTAL KNEE ARTHROPLASTY USING IMAGELESS COMPUTER-ASSISTED NAVIGATION;  Surgeon: Fidel Rogue, MD;  Location: [facility];  Service: Orthopedics;  Laterality: Left;   MENISCUS REPAIR RIGHT KNEE  2016   SHOULDER SURGERY Right 2002   ROTATOR CUFF    TOTAL KNEE ARTHROPLASTY Right 07/21/2017   Procedure: RIGHT TOTAL KNEE ARTHROPLASTY, PERTIAL PATELLECTOMY, REPAIR QUADRICEPS TENDON;  Surgeon: Duwayne Purchase, MD;  Location: [facility];  Service: Orthopedics;  Laterality: Right;  120 mins   TOTAL SHOULDER ARTHROPLASTY Left 03/22/2023    Current Outpatient Medications on File Prior to Visit  Medication Sig Dispense Refill   aspirin  (ASPIRIN  CHILDRENS) 81 MG chewable tablet Chew 1 tablet (81 mg total) by mouth 2 (two) times daily with a meal. 90 tablet 0   benazepril  (LOTENSIN ) 20 MG tablet Take 1 tablet by mouth once daily 90 tablet 0   famotidine  (PEPCID ) 20 MG tablet Take 1 tablet (20 mg total) by mouth 2 (two) times daily. 30 tablet 0   hydrOXYzine  (ATARAX ) 25 MG tablet Take 1 tablet (25 mg total) by mouth every 6 (six) hours as needed for anxiety. 12 tablet 0   ondansetron  (ZOFRAN ) 4 MG tablet Take 1 tablet (4 mg total) by mouth every 8 (eight) hours as needed for nausea or vomiting. 30 tablet 0   oxyCODONE  (ROXICODONE ) 5 MG immediate release tablet Take 1 tablet (5 mg total) by mouth every 4 (four) hours as needed for severe pain (pain score 7-10). 42 tablet 0   rosuvastatin  (CRESTOR ) 20 MG tablet TAKE 1 TABLET BY MOUTH ONCE DAILY . APPOINTMENT REQUIRED FOR FUTURE REFILLS 90 tablet 0   No current facility-administered medications on file prior to visit.   Allergies  Allergen Reactions   Latex Hives        Review of Systems  All other systems reviewed and are negative.      Objective:   Physical Exam Vitals reviewed.  Constitutional:      Appearance: Normal appearance. He is normal weight.  Cardiovascular:     Rate and Rhythm: Normal rate and regular rhythm.     Heart sounds: Normal heart sounds. No murmur heard.    No friction rub. No gallop.  Pulmonary:     Effort: Pulmonary effort is normal. No respiratory distress.     Breath sounds: Normal breath sounds. No wheezing, rhonchi or rales.  Chest:     Chest wall: No tenderness.  Abdominal:     General: Bowel sounds are normal. There is no distension.     Palpations: Abdomen is soft.     Tenderness: There is no abdominal tenderness. There is no rebound.  Musculoskeletal:     Right lower leg: No edema.     Left lower leg: No edema.  Neurological:      General: No focal deficit present.     Mental Status: He is alert and oriented to person, place, and time.     Motor: No abnormal muscle tone.     Deep Tendon Reflexes: Reflexes are normal and symmetric.          Assessment & Plan:  Hiatal hernia  Pure hypercholesterolemia - Plan: rosuvastatin  (CRESTOR ) 20 MG tablet  Dyspnea on exertion  Change in consistency of stool I am primarily concerned about his  orthopnea and dyspnea on exertion.  Thankfully he has an appointment to see his cardiologist this afternoon.  I suggested that he talk with his cardiologist about having a stress test and an echocardiogram to rule out cardiac sources of shortness of breath before we assume that the hiatal hernia is the source of the problem.  I did recommend starting Protonix  40 mg a day to help prevent acid reflux and also recommended that he elevate the head of his bed 20 to 30 degrees to help with symptoms at night.  I refilled his rosuvastatin .  Regarding the change in his stool consistency, this is likely irritable bowel.  He has tried and failed fiber.  Therefore we will try cholestyramine  4 g p.o. twice daily to see if this helps with loose stool.  If this does not help and he continues to experience gas and bloating and flatus we can try Flagyl  for possible small bowel bacterial overgrowth.  If this does not help I would recommend a GI consultation for colonoscopy.  However I stressed to the patient that I went to rule out cardiac sources of shortness of breath first "

## 2024-01-11 ENCOUNTER — Telehealth: Payer: Self-pay

## 2024-01-11 ENCOUNTER — Telehealth: Payer: Self-pay | Admitting: *Deleted

## 2024-01-11 DIAGNOSIS — R072 Precordial pain: Secondary | ICD-10-CM

## 2024-01-11 DIAGNOSIS — R52 Pain, unspecified: Secondary | ICD-10-CM

## 2024-01-11 MED ORDER — METOPROLOL TARTRATE 100 MG PO TABS: ORAL_TABLET | ORAL | 0 refills | Status: AC

## 2024-01-11 NOTE — Telephone Encounter (Signed)
-----   Message from Redell Shallow, MD sent at 01/10/2024  5:13 PM EST ----- Please schedule pt for coronary CTA with results to me Redell Shallow

## 2024-01-11 NOTE — Telephone Encounter (Signed)
 Copied from CRM [id number]. Topic: Clinical - Lab/Test Results >> Jan 11, 2024 10:45 AM Donna BRAVO wrote: Reason for CRM: Crystal  Dr [facility] needing most recent lipid panel 12/24/23 Possible  Medication change please fax to [phone]

## 2024-01-11 NOTE — Telephone Encounter (Signed)
 Order placed and instructions sent to patient via my chart.

## 2024-01-13 ENCOUNTER — Other Ambulatory Visit: Payer: Self-pay | Admitting: Family Medicine

## 2024-01-19 ENCOUNTER — Telehealth (HOSPITAL_COMMUNITY): Payer: Self-pay | Admitting: Emergency Medicine

## 2024-01-19 NOTE — Telephone Encounter (Signed)
 Attempted to call patient regarding upcoming cardiac CT appointment. Left message on voicemail with name and callback number Rockwell Alexandria RN Navigator [facility] [phone] Office [phone] Cell

## 2024-01-20 ENCOUNTER — Ambulatory Visit (HOSPITAL_COMMUNITY)
Admission: RE | Admit: 2024-01-20 | Discharge: 2024-01-20 | Disposition: A | Discharge status: Home / Self Care | Source: Ambulatory Visit | Attending: Cardiology | Admitting: Cardiology

## 2024-01-20 DIAGNOSIS — R072 Precordial pain: Secondary | ICD-10-CM | POA: Insufficient documentation

## 2024-01-20 DIAGNOSIS — I358 Other nonrheumatic aortic valve disorders: Secondary | ICD-10-CM | POA: Diagnosis not present

## 2024-01-20 DIAGNOSIS — I10 Essential (primary) hypertension: Secondary | ICD-10-CM | POA: Insufficient documentation

## 2024-01-20 DIAGNOSIS — R0602 Shortness of breath: Secondary | ICD-10-CM | POA: Diagnosis not present

## 2024-01-20 DIAGNOSIS — R931 Abnormal findings on diagnostic imaging of heart and coronary circulation: Secondary | ICD-10-CM | POA: Diagnosis not present

## 2024-01-20 DIAGNOSIS — I2510 Atherosclerotic heart disease of native coronary artery without angina pectoris: Secondary | ICD-10-CM | POA: Diagnosis not present

## 2024-01-20 DIAGNOSIS — E785 Hyperlipidemia, unspecified: Secondary | ICD-10-CM | POA: Diagnosis not present

## 2024-01-20 MED ORDER — IOHEXOL 350 MG/ML SOLN
100.0000 mL | Freq: Once | INTRAVENOUS | Status: AC | PRN
  Administered 2024-01-20: 100 mL via INTRAVENOUS

## 2024-01-20 MED ORDER — NITROGLYCERIN 0.4 MG SL SUBL
0.8000 mg | SUBLINGUAL_TABLET | Freq: Once | SUBLINGUAL | Status: AC
  Administered 2024-01-20: 0.8 mg via SUBLINGUAL

## 2024-01-21 ENCOUNTER — Other Ambulatory Visit: Payer: Self-pay | Admitting: Cardiovascular Disease

## 2024-01-21 ENCOUNTER — Ambulatory Visit (HOSPITAL_COMMUNITY)
Admission: RE | Admit: 2024-01-21 | Discharge: 2024-01-21 | Disposition: A | Discharge status: Home / Self Care | Source: Ambulatory Visit | Attending: Cardiovascular Disease | Admitting: Cardiovascular Disease

## 2024-01-21 DIAGNOSIS — R072 Precordial pain: Secondary | ICD-10-CM

## 2024-01-21 DIAGNOSIS — R931 Abnormal findings on diagnostic imaging of heart and coronary circulation: Secondary | ICD-10-CM | POA: Diagnosis not present

## 2024-01-22 ENCOUNTER — Ambulatory Visit: Payer: Self-pay | Admitting: Cardiology

## 2024-02-10 ENCOUNTER — Ambulatory Visit: Payer: Self-pay

## 2024-02-10 NOTE — Telephone Encounter (Signed)
 1st attempt to contact. Left message to return call via [phone]. Call placed in call back for additional attempts to contact.   Copied from CRM [id number]. Topic: Clinical - Medical Advice >> Feb 10, 2024 10:34 AM Edsel HERO wrote: Patient states that he has had a scratchy throat and cough for 7 days. Patient would like to know if Dr. Duanne will send in a z-pak for him. Please advise.

## 2024-02-10 NOTE — Telephone Encounter (Signed)
 3rd CB attempt. LCM to call office back.    Copied from CRM [id number]. Topic: Clinical - Medical Advice >> Feb 10, 2024 10:34 AM Edsel HERO wrote: Patient states that he has had a scratchy throat and cough for 7 days. Patient would like to know if Dr. Duanne will send in a z-pak for him. Please advise.

## 2024-02-10 NOTE — Telephone Encounter (Signed)
 Called pt - left message to return our call.  Copied from CRM [id number]. Topic: Clinical - Medical Advice >> Feb 10, 2024 10:34 AM Edsel HERO wrote: Patient states that he has had a scratchy throat and cough for 7 days. Patient would like to know if Dr. Duanne will send in a z-pak for him. Please advise.

## 2024-02-11 ENCOUNTER — Ambulatory Visit: Payer: Self-pay

## 2024-02-11 ENCOUNTER — Telehealth: Payer: Self-pay | Admitting: Family Medicine

## 2024-02-11 ENCOUNTER — Other Ambulatory Visit: Payer: Self-pay | Admitting: Family Medicine

## 2024-02-11 MED ORDER — AZITHROMYCIN 250 MG PO TABS: ORAL_TABLET | ORAL | 0 refills | Status: AC

## 2024-02-11 NOTE — Telephone Encounter (Signed)
 Copied from CRM [id number]. Topic: General - Call Back - No Documentation >> Feb 11, 2024 10:13 AM Wess RAMAN wrote: Reason for CRM: Patient missed call from Carlo Credit, RN to discuss getting zpak called into pharmacy.  Callback #: [phone]

## 2024-02-11 NOTE — Telephone Encounter (Signed)
 FYI Only or Action Required?: Action required by provider: clinical question for provider.  Patient was last seen in primary care on 01/10/2024 by Duanne Butler DASEN, MD.  Called Nurse Triage reporting Sore Throat and Cough.  Symptoms began a week ago.  Interventions attempted: OTC medications: Something my wife had for congestion and sore throat.  Symptoms are: gradually worsening.  Triage Disposition: Call PCP Now  Patient/caregiver understands and will follow disposition?:  Yes         Copied from CRM [id number]. Topic: Clinical - Medical Advice >> Feb 10, 2024 10:34 AM Shane Robinson wrote: Patient states that he has had a scratchy throat and cough for 7 days. Patient would like to know if Dr. Duanne will send in a z-pak for him. Please advise. Reason for Disposition  Cough  Answer Assessment - Initial Assessment Questions Patient states he gets this every winter and treats with Z pack. RN advised if asking for antibiotics he needs to be seen/evaluated. Patient states Dr Duanne will send in antibiotics for him without being seen.  1. ONSET: When did the cough begin?      X 7 days.  2. SEVERITY: How bad is the cough today?      Worse at night. Comes in spells, gets a tickle in his throat. Denies severe coughing or gagging/vomiting from cough.  3. SPUTUM: Describe the color of your sputum (e.g., none, dry cough; clear, white, yellow, green)     White.  4. HEMOPTYSIS: Are you coughing up any blood? If Yes, ask: How much? (e.g., flecks, streaks, tablespoons, etc.)     No.  5. DIFFICULTY BREATHING: Are you having difficulty breathing? If Yes, ask: How bad is it? (e.g., mild, moderate, severe)      No.  6. FEVER: Do you have a fever? If Yes, ask: What is your temperature, how was it measured, and when did it start?     No.  7. CARDIAC HISTORY: Do you have any history of heart disease? (e.g., heart attack, congestive heart failure)      No.  8. LUNG  HISTORY: Do you have any history of lung disease?  (e.g., pulmonary embolus, asthma, emphysema)     No.  9. PE RISK FACTORS: Do you have a history of blood clots? (or: recent major surgery, recent prolonged travel, bedridden)     No.  10. OTHER SYMPTOMS: Do you have any other symptoms? (e.g., runny nose, wheezing, chest pain)       Scratchy/irritated throat after coughing.  Protocols used: Cough - Acute Productive-A-AH

## 2024-02-11 NOTE — Telephone Encounter (Signed)
 Attempted to call pt x1. VM left for pt. Will attempt to contact pt at a later time.    Copied from CRM [id number]. Topic: Clinical - Medical Advice >> Feb 10, 2024 10:34 AM Edsel HERO wrote: Patient states that he has had a scratchy throat and cough for 7 days. Patient would like to know if Dr. Duanne will send in a z-pak for him. Please advise. >> Feb 11, 2024 12:54 PM Pinkey ORN wrote: Patient is returning multiple missed calls in regards to being prescribed a z-pack. I contacted [facility] and because the nurses are currently out at lunch, I was advised to route the patient to [facility].
